# Patient Record
Sex: Male | Born: 1935 | Marital: Married | Smoking: Never smoker
Health system: Southern US, Community
[De-identification: ages and names within clinical notes are randomized; demographics above are authoritative.]

## PROBLEM LIST (undated history)

## (undated) DIAGNOSIS — I10 Essential (primary) hypertension: Secondary | ICD-10-CM

## (undated) DIAGNOSIS — N189 Chronic kidney disease, unspecified: Secondary | ICD-10-CM

## (undated) DIAGNOSIS — E119 Type 2 diabetes mellitus without complications: Secondary | ICD-10-CM

## (undated) HISTORY — PX: PACEMAKER IMPLANT: EP1218

---

## 2003-10-19 ENCOUNTER — Other Ambulatory Visit: Payer: Self-pay

## 2003-10-20 ENCOUNTER — Other Ambulatory Visit: Payer: Self-pay

## 2003-11-14 ENCOUNTER — Other Ambulatory Visit: Payer: Self-pay

## 2004-11-30 ENCOUNTER — Ambulatory Visit: Payer: Self-pay | Admitting: Unknown Physician Specialty

## 2010-04-06 ENCOUNTER — Emergency Department: Payer: Self-pay | Admitting: Emergency Medicine

## 2011-11-21 ENCOUNTER — Ambulatory Visit: Payer: Self-pay | Admitting: Gastroenterology

## 2012-06-18 ENCOUNTER — Ambulatory Visit: Payer: Self-pay | Admitting: Cardiology

## 2012-06-18 DIAGNOSIS — I10 Essential (primary) hypertension: Secondary | ICD-10-CM

## 2012-06-18 LAB — PROTIME-INR
INR: 1.1
Prothrombin Time: 13.9 secs (ref 11.5–14.7)

## 2012-06-18 LAB — BASIC METABOLIC PANEL
Anion Gap: 8 (ref 7–16)
BUN: 15 mg/dL (ref 7–18)
Calcium, Total: 9.3 mg/dL (ref 8.5–10.1)
Chloride: 101 mmol/L (ref 98–107)
Co2: 27 mmol/L (ref 21–32)
Creatinine: 1.25 mg/dL (ref 0.60–1.30)
EGFR (Non-African Amer.): 55 — ABNORMAL LOW
Glucose: 166 mg/dL — ABNORMAL HIGH (ref 65–99)
Sodium: 136 mmol/L (ref 136–145)

## 2012-06-18 LAB — CBC WITH DIFFERENTIAL/PLATELET
Basophil #: 0.1 10*3/uL (ref 0.0–0.1)
HGB: 14.7 g/dL (ref 13.0–18.0)
Lymphocyte #: 1.4 10*3/uL (ref 1.0–3.6)
Lymphocyte %: 18.6 %
MCH: 30.4 pg (ref 26.0–34.0)
MCHC: 34.5 g/dL (ref 32.0–36.0)
Monocyte %: 5.2 %
Neutrophil #: 5.5 10*3/uL (ref 1.4–6.5)
Platelet: 200 10*3/uL (ref 150–440)
RBC: 4.84 10*6/uL (ref 4.40–5.90)
RDW: 14.4 % (ref 11.5–14.5)

## 2012-06-18 LAB — APTT: Activated PTT: 29.8 secs (ref 23.6–35.9)

## 2012-06-25 ENCOUNTER — Ambulatory Visit: Payer: Self-pay | Admitting: Cardiology

## 2014-06-10 NOTE — Op Note (Signed)
PATIENT NAME:  Javier Wong, Javier Wong MR#:  [id number] DATE OF BIRTH:  12/25/1932  DATE OF PROCEDURE:  06/25/2012  PREPROCEDURE DIAGNOSES:   1.  Complete heart block. 2.  Elective replacement.  POSTPROCEURE DIAGNOSIS: Atrial sensing, with ventricular pacing.  PROCEDURE: Dual-chamber pacemaker generator change-out.  INDICATION: The patient is a 81 year old gentleman with a known history of complete heart block. Recent pacemaker interrogation has shown that the pacemaker is at elective replacement indication.   The risks, benefits and alternatives of pacemaker generator change-out were explained to the patient. Informed written consent was obtained.  He was brought to the operating room in the fasting state. The left pectoral region was prepped and draped in the usual sterile manner. Anesthesia was obtained with 1% Xylocaine locally. A  6 cm incision was performed in the left pectoral region. The old pacemaker generator was retrieved by electrocautery and blunt dissection. The leads were disconnected. The old pacemaker generator interrogated until proper thresholds were obtained.  The leads were connected to a new dual-chamber rate-responsive generator. A Medtronic Adapta ABR-I. The pacemaker pocket was irrigated with gentamycin solution. The new pacemaker generator was positioned in the pocket. The pocket was closed with 2-0 and 4-0 Vicryl respectively. Steri-Strips and a pressure dressing were applied.   ____________________________ Isaias Cowman, MD ap:dm D: 06/25/2012 13:08:10 ET T: 06/25/2012 13:21:04 ET JOB#: [id number]  cc: Isaias Cowman, MD, <Dictator> Isaias Cowman MD ELECTRONICALLY SIGNED 06/29/2012 13:48

## 2016-02-21 ENCOUNTER — Ambulatory Visit
Admission: RE | Admit: 2016-02-21 | Discharge: 2016-02-21 | Disposition: A | Discharge status: Home / Self Care | Payer: Medicare Other | Source: Ambulatory Visit | Attending: Internal Medicine | Admitting: Internal Medicine

## 2016-02-21 ENCOUNTER — Other Ambulatory Visit: Payer: Self-pay | Admitting: Internal Medicine

## 2016-02-21 DIAGNOSIS — R51 Headache: Secondary | ICD-10-CM | POA: Diagnosis present

## 2016-02-21 DIAGNOSIS — G319 Degenerative disease of nervous system, unspecified: Secondary | ICD-10-CM | POA: Diagnosis not present

## 2016-02-21 DIAGNOSIS — R519 Headache, unspecified: Secondary | ICD-10-CM

## 2018-09-10 ENCOUNTER — Emergency Department: Payer: Medicare Other

## 2018-09-10 ENCOUNTER — Inpatient Hospital Stay
Admission: EM | Admit: 2018-09-10 | Discharge: 2018-09-19 | DRG: 682 | Disposition: A | Discharge status: Home / Self Care | Payer: Medicare Other | Attending: Internal Medicine | Admitting: Internal Medicine

## 2018-09-10 ENCOUNTER — Other Ambulatory Visit: Payer: Self-pay

## 2018-09-10 DIAGNOSIS — E860 Dehydration: Secondary | ICD-10-CM | POA: Diagnosis present

## 2018-09-10 DIAGNOSIS — R918 Other nonspecific abnormal finding of lung field: Secondary | ICD-10-CM | POA: Diagnosis not present

## 2018-09-10 DIAGNOSIS — E875 Hyperkalemia: Secondary | ICD-10-CM | POA: Diagnosis present

## 2018-09-10 DIAGNOSIS — Z20828 Contact with and (suspected) exposure to other viral communicable diseases: Secondary | ICD-10-CM | POA: Diagnosis present

## 2018-09-10 DIAGNOSIS — J61 Pneumoconiosis due to asbestos and other mineral fibers: Secondary | ICD-10-CM | POA: Diagnosis present

## 2018-09-10 DIAGNOSIS — Z791 Long term (current) use of non-steroidal anti-inflammatories (NSAID): Secondary | ICD-10-CM

## 2018-09-10 DIAGNOSIS — R0602 Shortness of breath: Secondary | ICD-10-CM

## 2018-09-10 DIAGNOSIS — Z794 Long term (current) use of insulin: Secondary | ICD-10-CM

## 2018-09-10 DIAGNOSIS — Z7189 Other specified counseling: Secondary | ICD-10-CM | POA: Diagnosis not present

## 2018-09-10 DIAGNOSIS — M7989 Other specified soft tissue disorders: Secondary | ICD-10-CM | POA: Diagnosis present

## 2018-09-10 DIAGNOSIS — K769 Liver disease, unspecified: Secondary | ICD-10-CM

## 2018-09-10 DIAGNOSIS — E1122 Type 2 diabetes mellitus with diabetic chronic kidney disease: Secondary | ICD-10-CM | POA: Diagnosis present

## 2018-09-10 DIAGNOSIS — J449 Chronic obstructive pulmonary disease, unspecified: Secondary | ICD-10-CM | POA: Diagnosis present

## 2018-09-10 DIAGNOSIS — J9811 Atelectasis: Secondary | ICD-10-CM | POA: Diagnosis present

## 2018-09-10 DIAGNOSIS — I4892 Unspecified atrial flutter: Secondary | ICD-10-CM | POA: Diagnosis present

## 2018-09-10 DIAGNOSIS — I482 Chronic atrial fibrillation, unspecified: Secondary | ICD-10-CM | POA: Diagnosis present

## 2018-09-10 DIAGNOSIS — Z66 Do not resuscitate: Secondary | ICD-10-CM | POA: Diagnosis present

## 2018-09-10 DIAGNOSIS — E872 Acidosis: Secondary | ICD-10-CM | POA: Diagnosis present

## 2018-09-10 DIAGNOSIS — R5383 Other fatigue: Secondary | ICD-10-CM

## 2018-09-10 DIAGNOSIS — I130 Hypertensive heart and chronic kidney disease with heart failure and stage 1 through stage 4 chronic kidney disease, or unspecified chronic kidney disease: Secondary | ICD-10-CM | POA: Diagnosis present

## 2018-09-10 DIAGNOSIS — N189 Chronic kidney disease, unspecified: Secondary | ICD-10-CM | POA: Diagnosis not present

## 2018-09-10 DIAGNOSIS — F419 Anxiety disorder, unspecified: Secondary | ICD-10-CM | POA: Diagnosis present

## 2018-09-10 DIAGNOSIS — I5033 Acute on chronic diastolic (congestive) heart failure: Secondary | ICD-10-CM | POA: Diagnosis present

## 2018-09-10 DIAGNOSIS — K5900 Constipation, unspecified: Secondary | ICD-10-CM | POA: Diagnosis present

## 2018-09-10 DIAGNOSIS — T39395A Adverse effect of other nonsteroidal anti-inflammatory drugs [NSAID], initial encounter: Secondary | ICD-10-CM | POA: Diagnosis present

## 2018-09-10 DIAGNOSIS — C787 Secondary malignant neoplasm of liver and intrahepatic bile duct: Secondary | ICD-10-CM | POA: Diagnosis present

## 2018-09-10 DIAGNOSIS — E785 Hyperlipidemia, unspecified: Secondary | ICD-10-CM | POA: Diagnosis present

## 2018-09-10 DIAGNOSIS — I442 Atrioventricular block, complete: Secondary | ICD-10-CM | POA: Diagnosis present

## 2018-09-10 DIAGNOSIS — I3139 Other pericardial effusion (noninflammatory): Secondary | ICD-10-CM

## 2018-09-10 DIAGNOSIS — N179 Acute kidney failure, unspecified: Secondary | ICD-10-CM | POA: Diagnosis not present

## 2018-09-10 DIAGNOSIS — Z7982 Long term (current) use of aspirin: Secondary | ICD-10-CM

## 2018-09-10 DIAGNOSIS — Z950 Presence of cardiac pacemaker: Secondary | ICD-10-CM

## 2018-09-10 DIAGNOSIS — J9600 Acute respiratory failure, unspecified whether with hypoxia or hypercapnia: Secondary | ICD-10-CM | POA: Diagnosis present

## 2018-09-10 DIAGNOSIS — M109 Gout, unspecified: Secondary | ICD-10-CM | POA: Diagnosis present

## 2018-09-10 DIAGNOSIS — E871 Hypo-osmolality and hyponatremia: Secondary | ICD-10-CM | POA: Diagnosis present

## 2018-09-10 DIAGNOSIS — Z515 Encounter for palliative care: Secondary | ICD-10-CM | POA: Diagnosis not present

## 2018-09-10 DIAGNOSIS — R160 Hepatomegaly, not elsewhere classified: Secondary | ICD-10-CM | POA: Diagnosis present

## 2018-09-10 DIAGNOSIS — I959 Hypotension, unspecified: Secondary | ICD-10-CM | POA: Diagnosis present

## 2018-09-10 DIAGNOSIS — Z79899 Other long term (current) drug therapy: Secondary | ICD-10-CM

## 2018-09-10 DIAGNOSIS — Z789 Other specified health status: Secondary | ICD-10-CM

## 2018-09-10 DIAGNOSIS — N183 Chronic kidney disease, stage 3 (moderate): Secondary | ICD-10-CM | POA: Diagnosis present

## 2018-09-10 DIAGNOSIS — D631 Anemia in chronic kidney disease: Secondary | ICD-10-CM | POA: Diagnosis present

## 2018-09-10 DIAGNOSIS — Z79891 Long term (current) use of opiate analgesic: Secondary | ICD-10-CM

## 2018-09-10 DIAGNOSIS — I313 Pericardial effusion (noninflammatory): Secondary | ICD-10-CM | POA: Diagnosis present

## 2018-09-10 HISTORY — DX: Chronic kidney disease, unspecified: N18.9

## 2018-09-10 HISTORY — DX: Essential (primary) hypertension: I10

## 2018-09-10 HISTORY — DX: Type 2 diabetes mellitus without complications: E11.9

## 2018-09-10 LAB — COMPREHENSIVE METABOLIC PANEL
ALT: 14 U/L (ref 0–44)
AST: 19 U/L (ref 15–41)
Albumin: 3.7 g/dL (ref 3.5–5.0)
Alkaline Phosphatase: 71 U/L (ref 38–126)
Anion gap: 17 — ABNORMAL HIGH (ref 5–15)
BUN: 70 mg/dL — ABNORMAL HIGH (ref 8–23)
CO2: 16 mmol/L — ABNORMAL LOW (ref 22–32)
Calcium: 8.8 mg/dL — ABNORMAL LOW (ref 8.9–10.3)
Chloride: 97 mmol/L — ABNORMAL LOW (ref 98–111)
Creatinine, Ser: 2.84 mg/dL — ABNORMAL HIGH (ref 0.61–1.24)
GFR calc Af Amer: 23 mL/min — ABNORMAL LOW (ref >60)
GFR calc non Af Amer: 20 mL/min — ABNORMAL LOW (ref >60)
Glucose, Bld: 122 mg/dL — ABNORMAL HIGH (ref 70–99)
Potassium: 6 mmol/L — ABNORMAL HIGH (ref 3.5–5.1)
Sodium: 130 mmol/L — ABNORMAL LOW (ref 135–145)
Total Bilirubin: 1 mg/dL (ref 0.3–1.2)
Total Protein: 7.7 g/dL (ref 6.5–8.1)

## 2018-09-10 LAB — CBC WITH DIFFERENTIAL/PLATELET
Abs Immature Granulocytes: 0.1 10*3/uL — ABNORMAL HIGH (ref 0.00–0.07)
Basophils Absolute: 0 10*3/uL (ref 0.0–0.1)
Basophils Relative: 0 %
Eosinophils Absolute: 0 10*3/uL (ref 0.0–0.5)
Eosinophils Relative: 0 %
HCT: 41.4 % (ref 39.0–52.0)
Hemoglobin: 13.5 g/dL (ref 13.0–17.0)
Immature Granulocytes: 1 %
Lymphocytes Relative: 4 %
Lymphs Abs: 0.5 10*3/uL — ABNORMAL LOW (ref 0.7–4.0)
MCH: 29.9 pg (ref 26.0–34.0)
MCHC: 32.6 g/dL (ref 30.0–36.0)
MCV: 91.8 fL (ref 80.0–100.0)
Monocytes Absolute: 0.7 10*3/uL (ref 0.1–1.0)
Monocytes Relative: 5 %
Neutro Abs: 12.9 10*3/uL — ABNORMAL HIGH (ref 1.7–7.7)
Neutrophils Relative %: 90 %
Platelets: 420 10*3/uL — ABNORMAL HIGH (ref 150–400)
RBC: 4.51 MIL/uL (ref 4.22–5.81)
RDW: 14.2 % (ref 11.5–15.5)
WBC: 14.3 10*3/uL — ABNORMAL HIGH (ref 4.0–10.5)
nRBC: 0 % (ref 0.0–0.2)

## 2018-09-10 LAB — GLUCOSE, CAPILLARY: Glucose-Capillary: 111 mg/dL — ABNORMAL HIGH (ref 70–99)

## 2018-09-10 LAB — TSH: TSH: 1.404 u[IU]/mL (ref 0.350–4.500)

## 2018-09-10 LAB — SARS CORONAVIRUS 2 BY RT PCR (HOSPITAL ORDER, PERFORMED IN ~~LOC~~ HOSPITAL LAB): SARS Coronavirus 2: NEGATIVE

## 2018-09-10 LAB — MAGNESIUM: Magnesium: 2.6 mg/dL — ABNORMAL HIGH (ref 1.7–2.4)

## 2018-09-10 LAB — BRAIN NATRIURETIC PEPTIDE: B Natriuretic Peptide: 72 pg/mL (ref 0.0–100.0)

## 2018-09-10 LAB — PROTIME-INR
INR: 1.3 — ABNORMAL HIGH (ref 0.8–1.2)
Prothrombin Time: 16.1 seconds — ABNORMAL HIGH (ref 11.4–15.2)

## 2018-09-10 LAB — APTT: aPTT: 33 seconds (ref 24–36)

## 2018-09-10 LAB — TROPONIN I (HIGH SENSITIVITY)
Troponin I (High Sensitivity): 14 ng/L (ref <18)
Troponin I (High Sensitivity): 15 ng/L (ref <18)

## 2018-09-10 LAB — POTASSIUM: Potassium: 5.6 mmol/L — ABNORMAL HIGH (ref 3.5–5.1)

## 2018-09-10 MED ORDER — HYDROCODONE-ACETAMINOPHEN 5-325 MG PO TABS: 1.0000 | ORAL_TABLET | ORAL | Status: DC | PRN

## 2018-09-10 MED ORDER — SODIUM CHLORIDE 0.9 % IV BOLUS
500.0000 mL | Freq: Once | INTRAVENOUS | Status: AC
  Administered 2018-09-10: 500 mL via INTRAVENOUS

## 2018-09-10 MED ORDER — BISACODYL 5 MG PO TBEC: 5.0000 mg | DELAYED_RELEASE_TABLET | Freq: Every day | ORAL | Status: DC | PRN

## 2018-09-10 MED ORDER — INSULIN ASPART 100 UNIT/ML ~~LOC~~ SOLN
0.0000 [IU] | Freq: Three times a day (TID) | SUBCUTANEOUS | Status: DC
  Administered 2018-09-11 – 2018-09-12 (×3): 1 [IU] via SUBCUTANEOUS
  Administered 2018-09-13: 2 [IU] via SUBCUTANEOUS
  Administered 2018-09-13 – 2018-09-14 (×4): 1 [IU] via SUBCUTANEOUS
  Administered 2018-09-15: 3 [IU] via SUBCUTANEOUS
  Administered 2018-09-16: 2 [IU] via SUBCUTANEOUS
  Administered 2018-09-16: 1 [IU] via SUBCUTANEOUS
  Administered 2018-09-17: 2 [IU] via SUBCUTANEOUS
  Administered 2018-09-17: 3 [IU] via SUBCUTANEOUS
  Administered 2018-09-18 (×2): 2 [IU] via SUBCUTANEOUS
  Administered 2018-09-19: 3 [IU] via SUBCUTANEOUS
  Filled 2018-09-10 (×16): qty 1

## 2018-09-10 MED ORDER — ACETAMINOPHEN 650 MG RE SUPP: 650.0000 mg | Freq: Four times a day (QID) | RECTAL | Status: DC | PRN

## 2018-09-10 MED ORDER — SODIUM ZIRCONIUM CYCLOSILICATE 10 G PO PACK
10.0000 g | PACK | Freq: Three times a day (TID) | ORAL | Status: DC
  Administered 2018-09-10 – 2018-09-12 (×6): 10 g via ORAL
  Filled 2018-09-10 (×9): qty 1

## 2018-09-10 MED ORDER — HEPARIN SODIUM (PORCINE) 5000 UNIT/ML IJ SOLN
5000.0000 [IU] | Freq: Three times a day (TID) | INTRAMUSCULAR | Status: DC
  Administered 2018-09-10 – 2018-09-16 (×18): 5000 [IU] via SUBCUTANEOUS
  Filled 2018-09-10 (×18): qty 1

## 2018-09-10 MED ORDER — TRAZODONE HCL 50 MG PO TABS
25.0000 mg | ORAL_TABLET | Freq: Every evening | ORAL | Status: DC | PRN
  Administered 2018-09-11 – 2018-09-14 (×4): 25 mg via ORAL
  Filled 2018-09-10 (×4): qty 1

## 2018-09-10 MED ORDER — ACETAMINOPHEN 325 MG PO TABS: 650.0000 mg | ORAL_TABLET | Freq: Four times a day (QID) | ORAL | Status: DC | PRN

## 2018-09-10 MED ORDER — DOCUSATE SODIUM 100 MG PO CAPS
100.0000 mg | ORAL_CAPSULE | Freq: Two times a day (BID) | ORAL | Status: DC
  Administered 2018-09-15 – 2018-09-16 (×3): 100 mg via ORAL
  Filled 2018-09-10 (×13): qty 1

## 2018-09-10 MED ORDER — STERILE WATER FOR INJECTION IV SOLN
INTRAVENOUS | Status: DC
  Administered 2018-09-10 – 2018-09-12 (×4): via INTRAVENOUS
  Filled 2018-09-10 (×5): qty 9.71

## 2018-09-10 MED ORDER — ONDANSETRON HCL 4 MG PO TABS: 4.0000 mg | ORAL_TABLET | Freq: Four times a day (QID) | ORAL | Status: DC | PRN

## 2018-09-10 MED ORDER — ONDANSETRON HCL 4 MG/2ML IJ SOLN: 4.0000 mg | Freq: Four times a day (QID) | INTRAMUSCULAR | Status: DC | PRN

## 2018-09-10 NOTE — ED Notes (Addendum)
ED TO INPATIENT HANDOFF REPORT  ED Nurse Name and Phone #: Denine Brotz [phone]  S Name/Age/Gender Javier Wong 85 y.o. male Room/Bed: ED24A/ED24A  Code Status   Code Status: Full Code  Home/SNF/Other home Patient oriented x 4 Is this baseline?   Triage Complete: Triage complete  Chief Complaint does not feel well  Triage Note Pt arrives to ed via ems from home. Ems reports pt son called ems due to pt stating he felt like pace maker wasn't working right. EMS reports pt has not been eating well for several weeks. Pt a&o x 4 on arrival. Pt reported to ems that when attempting to feel radial pulse he was unable to feel pulse and felt like his pacer wasn't working correctly. Pt unable to recall what kind of pacer he has. NAD noted at this time   Ems vitasl: 138/84 CBG: 118   Allergies No Known Allergies  Level of Care/Admitting Diagnosis ED Disposition    ED Disposition Condition [facility] Area: [place] [[id number]]  Level of Care: Telemetry [5]  Covid Evaluation: Asymptomatic Screening Protocol (No Symptoms)  Diagnosis: Acute kidney injury [facility]) [[id number]]  Admitting Physician: Epifanio Lesches [[id number]]  Attending Physician: Epifanio Lesches [phone]  Estimated length of stay: past midnight tomorrow  Certification:: I certify this patient will need inpatient services for at least 2 midnights  PT Class (Do Not Modify): Inpatient [101]  PT Acc Code (Do Not Modify): Private [1]       B Medical/Surgery History   A IV Location/Drains/Wounds Patient Lines/Drains/Airways Status   Active Line/Drains/Airways    Name:   Placement date:   Placement time:   Site:   Days:   Peripheral IV 09/10/18 Left Antecubital   09/10/18    1340    Antecubital   less than 1          Intake/Output Last 24 hours  Intake/Output Summary (Last 24 hours) at 09/10/2018 1812 Last data filed at 09/10/2018 1703 Gross per 24 hour  Intake 500 ml   Output -  Net 500 ml    Labs/Imaging Results for orders placed or performed during the hospital encounter of 09/10/18 (from the past 48 hour(s))  CBC with Differential     Status: Abnormal   Collection Time: 09/10/18  2:02 PM  Result Value Ref Range   WBC 14.3 (H) 4.0 - 10.5 K/uL   RBC 4.51 4.22 - 5.81 MIL/uL   Hemoglobin 13.5 13.0 - 17.0 g/dL   HCT 41.4 39.0 - 52.0 %   MCV 91.8 80.0 - 100.0 fL   MCH 29.9 26.0 - 34.0 pg   MCHC 32.6 30.0 - 36.0 g/dL   RDW 14.2 11.5 - 15.5 %   Platelets 420 (H) 150 - 400 K/uL   nRBC 0.0 0.0 - 0.2 %   Neutrophils Relative % 90 %   Neutro Abs 12.9 (H) 1.7 - 7.7 K/uL   Lymphocytes Relative 4 %   Lymphs Abs 0.5 (L) 0.7 - 4.0 K/uL   Monocytes Relative 5 %   Monocytes Absolute 0.7 0.1 - 1.0 K/uL   Eosinophils Relative 0 %   Eosinophils Absolute 0.0 0.0 - 0.5 K/uL   Basophils Relative 0 %   Basophils Absolute 0.0 0.0 - 0.1 K/uL   Immature Granulocytes 1 %   Abs Immature Granulocytes 0.10 (H) 0.00 - 0.07 K/uL    Comment: Performed at [facility], [address] [place], [place] [address]  Comprehensive metabolic  panel     Status: Abnormal   Collection Time: 09/10/18  2:02 PM  Result Value Ref Range   Sodium 130 (L) 135 - 145 mmol/L   Potassium 6.0 (H) 3.5 - 5.1 mmol/L   Chloride 97 (L) 98 - 111 mmol/L   CO2 16 (L) 22 - 32 mmol/L   Glucose, Bld 122 (H) 70 - 99 mg/dL   BUN 70 (H) 8 - 23 mg/dL   Creatinine, Ser 2.84 (H) 0.61 - 1.24 mg/dL   Calcium 8.8 (L) 8.9 - 10.3 mg/dL   Total Protein 7.7 6.5 - 8.1 g/dL   Albumin 3.7 3.5 - 5.0 g/dL   AST 19 15 - 41 U/L   ALT 14 0 - 44 U/L   Alkaline Phosphatase 71 38 - 126 U/L   Total Bilirubin 1.0 0.3 - 1.2 mg/dL   GFR calc non Af Amer 20 (L) >60 mL/min   GFR calc Af Amer 23 (L) >60 mL/min   Anion gap 17 (H) 5 - 15    Comment: Performed at [facility], [place], [place] [address]  Troponin I (High Sensitivity)     Status: None   Collection Time: 09/10/18  2:02 PM   Result Value Ref Range   Troponin I (High Sensitivity) 14 <18 ng/L    Comment: (NOTE) Elevated high sensitivity troponin I (hsTnI) values and significant  changes across serial measurements may suggest ACS but many other  chronic and acute conditions are known to elevate hsTnI results.  Refer to the "Links" section for chest pain algorithms and additional  guidance. Performed at [facility], [address] [place], [place] [address]   Magnesium     Status: Abnormal   Collection Time: 09/10/18  2:02 PM  Result Value Ref Range   Magnesium 2.6 (H) 1.7 - 2.4 mg/dL    Comment: Performed at [facility], [address] [place], [place] [address]  Brain natriuretic peptide     Status: None   Collection Time: 09/10/18  2:02 PM  Result Value Ref Range   B Natriuretic Peptide 72.0 0.0 - 100.0 pg/mL    Comment: Performed at [facility], [address] [place], [place] [address]  Protime-INR     Status: Abnormal   Collection Time: 09/10/18  2:02 PM  Result Value Ref Range   Prothrombin Time 16.1 (H) 11.4 - 15.2 seconds   INR 1.3 (H) 0.8 - 1.2    Comment: (NOTE) INR goal varies based on device and disease states. Performed at [facility], [address] [place], [place] [address]   APTT     Status: None   Collection Time: 09/10/18  2:02 PM  Result Value Ref Range   aPTT 33 24 - 36 seconds    Comment: Performed at [facility], [address] [place], [place] [address]  SARS Coronavirus 2 (CEPHEID - Performed in [place] hospital lab), Hosp Order     Status: None   Collection Time: 09/10/18  2:03 PM   Specimen: Nasopharyngeal Swab  Result Value Ref Range   SARS Coronavirus 2 NEGATIVE NEGATIVE    Comment: (NOTE) If result is NEGATIVE SARS-CoV-2 target nucleic acids are NOT DETECTED. The SARS-CoV-2 RNA is generally detectable in upper and lower  respiratory specimens during the acute phase of infection. The lowest   concentration of SARS-CoV-2 viral copies this assay can detect is 250  copies / mL. A negative result does not preclude SARS-CoV-2  infection  and should not be used as the sole basis for treatment or other  patient management decisions.  A negative result may occur with  improper specimen collection / handling, submission of specimen other  than nasopharyngeal swab, presence of viral mutation(s) within the  areas targeted by this assay, and inadequate number of viral copies  (<250 copies / mL). A negative result must be combined with clinical  observations, patient history, and epidemiological information. If result is POSITIVE SARS-CoV-2 target nucleic acids are DETECTED. The SARS-CoV-2 RNA is generally detectable in upper and lower  respiratory specimens dur ing the acute phase of infection.  Positive  results are indicative of active infection with SARS-CoV-2.  Clinical  correlation with patient history and other diagnostic information is  necessary to determine patient infection status.  Positive results do  not rule out bacterial infection or co-infection with other viruses. If result is PRESUMPTIVE POSTIVE SARS-CoV-2 nucleic acids MAY BE PRESENT.   A presumptive positive result was obtained on the submitted specimen  and confirmed on repeat testing.  While 2019 novel coronavirus  (SARS-CoV-2) nucleic acids may be present in the submitted sample  additional confirmatory testing may be necessary for epidemiological  and / or clinical management purposes  to differentiate between  SARS-CoV-2 and other Sarbecovirus currently known to infect humans.  If clinically indicated additional testing with an alternate test  methodology [phone]) is advised. The SARS-CoV-2 RNA is generally  detectable in upper and lower respiratory sp ecimens during the acute  phase of infection. The expected result is Negative. Fact Sheet for Patients:  StrictlyIdeas.no Fact Sheet  for Healthcare Providers: BankingDealers.co.za This test is not yet approved or cleared by the [place] FDA and has been authorized for detection and/or diagnosis of SARS-CoV-2 by FDA under an Emergency Use Authorization (EUA).  This EUA will remain in effect (meaning this test can be used) for the duration of the COVID-19 declaration under Section 564(b)(1) of the Act, 21 U.S.C. section 360bbb-3(b)(1), unless the authorization is terminated or revoked sooner. Performed at [facility], [address], [place], [place] [address]    Dg Chest Portable 1 View  Result Date: 09/10/2018 CLINICAL DATA:  Shortness of breath. EXAM: PORTABLE CHEST 1 VIEW COMPARISON:  Radiograph of April 06, 2010. FINDINGS: Stable cardiomegaly. Left-sided pacemaker is unchanged in position. No pneumothorax is noted. No significant pleural effusion is noted. Minimal bibasilar subsegmental atelectasis is noted. Degenerative changes are seen involving both glenohumeral joints. IMPRESSION: Minimal bibasilar subsegmental atelectasis. Electronically Signed   By: Marijo Conception M.D.   On: 09/10/2018 14:47    Pending Labs Unresulted Labs (From admission, onward)    Start     Ordered   09/11/18 8119  Basic metabolic panel  Tomorrow morning,   STAT     09/10/18 1521   09/11/18 0500  CBC  Tomorrow morning,   STAT     09/10/18 1521   09/10/18 1636  Hemoglobin A1c  Once,   STAT    Comments: To assess prior glycemic control    09/10/18 1635   09/10/18 1518  CBC  (heparin)  Once,   STAT    Comments: Baseline for heparin therapy IF NOT ALREADY DRAWN.  Notify MD if PLT < 100 K.    09/10/18 1521   09/10/18 1518  Creatinine, serum  (heparin)  Once,   STAT    Comments: Baseline for heparin therapy IF NOT ALREADY DRAWN.    09/10/18 1521  09/10/18 1442  TSH  ONCE - STAT,   STAT     09/10/18 1441          Vitals/Pain Today's Vitals   09/10/18 1630 09/10/18 1700 09/10/18 1730  09/10/18 1800  BP: 124/65 (!) 143/59 (!) 132/56 (!) 119/47  Pulse: (!) 49 (!) 59  (!) 47  Resp: 18 20  (!) 23  Temp:      TempSrc:      SpO2: 94% 96%  (!) 77%  Weight:      Height:      PainSc:        Isolation Precautions No active isolations  Medications Medications  heparin injection 5,000 Units (has no administration in time range)  acetaminophen (TYLENOL) tablet 650 mg (has no administration in time range)    Or  acetaminophen (TYLENOL) suppository 650 mg (has no administration in time range)  HYDROcodone-acetaminophen (NORCO/VICODIN) 5-325 MG per tablet 1 tablet (has no administration in time range)  traZODone (DESYREL) tablet 25 mg (has no administration in time range)  docusate sodium (COLACE) capsule 100 mg (has no administration in time range)  bisacodyl (DULCOLAX) EC tablet 5 mg (has no administration in time range)  ondansetron (ZOFRAN) tablet 4 mg (has no administration in time range)    Or  ondansetron (ZOFRAN) injection 4 mg (has no administration in time range)  sodium chloride 0.225 % with sodium bicarbonate 100 mEq infusion ( Intravenous New Bag/Given 09/10/18 1656)  sodium zirconium cyclosilicate (LOKELMA) packet 10 g (10 g Oral Given 09/10/18 1659)  insulin aspart (novoLOG) injection 0-9 Units (has no administration in time range)  sodium chloride 0.9 % bolus 500 mL (0 mLs Intravenous Stopped 09/10/18 1703)    Mobility Normally ambulatory with cane/walker     Focused Assessments    R Recommendations: See Admitting Provider Note  Report given to:   Additional Notes:

## 2018-09-10 NOTE — ED Notes (Signed)
Attempted to call report at this time, unable to give report. RN states she will look over pt and call back

## 2018-09-10 NOTE — ED Notes (Signed)
2A notified the RN was bringing pt to floor at this time and bedside report will be completed on arrival. Report given to beatrice upon arrival to room 238

## 2018-09-10 NOTE — H&P (Addendum)
[place] [facility] [place] NAME: Javier Wong    MR#:  [id number]  DATE OF BIRTH:  July 24, 1932  DATE OF ADMISSION:  09/10/2018  PRIMARY CARE PHYSICIAN: Javier Axe, MD   REQUESTING/REFERRING PHYSICIAN: Dr. Marjean Wong  CHIEF COMPLAINT: Generalized weakness, poor p.o. intake   Chief Complaint  Patient presents with  . Fatigue  . Pacemaker Problem    HISTORY OF PRESENT ILLNESS:  Javier Wong  is a 85 y.o. male with a known history of hypertension, diabetes mellitus type 2 comes in because of fatigue.  Patient told me that he is feeling Very weak, nauseous and has poor p.o. intake for 4 weeks.  He says that whenever he tries to eat he feels very nauseous so he stopped eating.  Denies abdominal pain or diarrhea.  No fever.  Patient also complains of shortness of breath with minimal exertion.  No 1 2 L of oxygen.  And saturation 92%.  COVID-19 test is pending.  No cough.  Patient lives alone, main complaint today is generalized weakness, poor p.o. intake.  He thought it is a pacemaker.  Patient found to have acute kidney injury with potassium up to 6, will consult nephrology, cardiology.  Patient found to have heart rate around 50 bpm.  PAST MEDICAL HISTORY:  No past medical history on file.   Patient tells me he has history of hypertension, diabetes mellitus type 2.  PAST SURGICAL HISTOIRY:  No surgical history.  SOCIAL HISTORY:   Social History   Tobacco Use  . Smoking status: Never Smoker  Substance Use Topics  . Alcohol use: Never    Frequency: Never    FAMILY HISTORY:  No family history on file.  DRUG ALLERGIES:  No Known Allergies  REVIEW OF SYSTEMS:  CONSTITUTIONAL: Generalized weakness, shortness of breath. EYES: No blurred or double vision.  EARS, NOSE, AND THROAT: No tinnitus or ear pain.  RESPIRATORY: No cough, shortness of breath, wheezing or hemoptysis.  CARDIOVASCULAR: No chest pain, orthopnea, edema.   GASTROINTESTINAL: Nausea for 4 weeks and poor p.o. intake because of nausea.  GENITOURINARY: No dysuria, hematuria.  ENDOCRINE: No polyuria, nocturia,  HEMATOLOGY: No anemia, easy bruising or bleeding SKIN: No rash or lesion. MUSCULOSKELETAL: No joint pain or arthritis.   NEUROLOGIC: No tingling, numbness, weakness.  PSYCHIATRY: No anxiety or depression.   MEDICATIONS AT HOME:   Prior to Admission medications   Medication Sig Start Date End Date Taking? Authorizing Provider  allopurinol (ZYLOPRIM) 100 MG tablet Take 100 mg by mouth daily. 07/10/18  Yes [provider]  aspirin EC 81 MG tablet Take 81 mg by mouth daily.   Yes [provider]  cyanocobalamin (,VITAMIN B-12,) 1000 MCG/ML injection Inject 1,000 mcg into the muscle every 30 (thirty) days. 03/24/18  Yes [provider]  furosemide (LASIX) 40 MG tablet Take 40 mg by mouth daily. 02/20/18  Yes [provider]  hydrochlorothiazide (HYDRODIURIL) 25 MG tablet Take 25 mg by mouth daily. 02/20/18  Yes [provider]  insulin lispro protamine-lispro (HUMALOG 75/25 MIX) (75-25) 100 UNIT/ML SUSP injection Inject 10 Units into the skin daily with breakfast. 02/20/18  Yes [provider]  metFORMIN (GLUCOPHAGE) 1000 MG tablet Take 1,000 mg by mouth 2 (two) times a day. 03/31/18  Yes [provider]  naproxen sodium (ALEVE) 220 MG tablet Take 220 mg by mouth daily as needed.   Yes [provider]  ramipril (ALTACE) 10 MG capsule Take 10  mg by mouth 2 (two) times a day. 07/10/18  Yes [provider]  simvastatin (ZOCOR) 40 MG tablet Take 40 mg by mouth at bedtime. 02/20/18  Yes [provider]  traMADol (ULTRAM) 50 MG tablet Take 50 mg by mouth every 8 (eight) hours as needed for pain. 04/14/18  Yes [provider]      VITAL SIGNS:  Blood pressure (!) 140/58, pulse (!) 50, temperature 97.6 F (36.4 C), temperature source Oral, resp. rate 14, height 5'  11" (1.803 m), weight 108.9 kg, SpO2 95 %.  PHYSICAL EXAMINATION:  GENERAL:  85 y.o.-year-old patient lying in the bed with no acute distress.  Patient appears unkempt. EYES: Pupils equal, round, reactive to light . No scleral icterus. Extraocular muscles intact.  HEENT: Head atraumatic, normocephalic. Oropharynx and nasopharynx clear.  NECK:  Supple, no jugular venous distention. No thyroid enlargement, no tenderness.  LUNGS: Diminished air entry bilaterally.  CARDIOVASCULAR: S1, S2 normal. No murmurs, rubs, or gallops.  ABDOMEN: Soft, nontender, nondistended. Bowel sounds present. No organomegaly or mass.  EXTREMITIES: No pedal edema, cyanosis, or clubbing.  NEUROLOGIC: Cranial nerves II through XII are intact. Muscle strength 5/5 in all extremities. Sensation intact. Gait not checked.  PSYCHIATRIC: The patient is alert and oriented x 3.  SKIN: No obvious rash, lesion, or ulcer.   LABORATORY PANEL:   CBC Recent Labs  Lab 09/10/18 1402  WBC 14.3*  HGB 13.5  HCT 41.4  PLT 420*   ------------------------------------------------------------------------------------------------------------------  Chemistries  Recent Labs  Lab 09/10/18 1402  NA 130*  K 6.0*  CL 97*  CO2 16*  GLUCOSE 122*  BUN 70*  CREATININE 2.84*  CALCIUM 8.8*  MG 2.6*  AST 19  ALT 14  ALKPHOS 71  BILITOT 1.0   ------------------------------------------------------------------------------------------------------------------  Cardiac Enzymes No results for input(s): TROPONINI in the last 168 hours. ------------------------------------------------------------------------------------------------------------------  RADIOLOGY:  Dg Chest Portable 1 View  Result Date: 09/10/2018 CLINICAL DATA:  Shortness of breath. EXAM: PORTABLE CHEST 1 VIEW COMPARISON:  Radiograph of April 06, 2010. FINDINGS: Stable cardiomegaly. Left-sided pacemaker is unchanged in position. No pneumothorax is noted. No significant  pleural effusion is noted. Minimal bibasilar subsegmental atelectasis is noted. Degenerative changes are seen involving both glenohumeral joints. IMPRESSION: Minimal bibasilar subsegmental atelectasis. Electronically Signed   By: Javier Wong M.D.   On: 09/10/2018 14:47    EKG:   Orders placed or performed during the hospital encounter of 09/10/18  . EKG 12-Lead  . EKG 12-Lead  EKG showed ventricular paced rhythm at 50 bpm, no ST elevations or T wave inversions.  IMPRESSION AND PLAN:   85 year old male patient with history of essential hypertension, diabetes mellitus type 2, pacemaker comes in because of decreased fluid intake and shortness of breath, nausea for 4 weeks.   Acute kidney injury, unknown baseline patient's creatinine was 1.25 in 2014 and I do not have any r labs since then in the computer.  Prerenal likely secondary to poor p.o. intake, continue IV hydration, patient has nausea likely secondary to uremia and acidosis, started on bicarb drip, consult nephrology. 2.  Severe hyperkalemia with potassium of 6 patient did not get any shifting measures in the emergency room, started on bicarb drip, spoke with Dr. Zollie Scale who recommended to give [facility] now, also ordered 10 mg 3 times daily. 3.  Chronic A. fib.  Patient says heart rate is around 70s, currently ER doctor felt  EKG concerning for underlying atrial flutter, ER doctor discussed with Dr. Jacqualine Code  reported patient has dual-chamber pacemaker due to atrial arrhythmia switched to VVI with low heart rate 50 bpm.  Consult cardiology.  #4/acute respiratory failure likely secondary to renal failure, continue oxygen and wean off as tolerated, check COVID-19 test is ordered, done results not available yet.  5.Lower extremity edema, patient clinically appears dehydrated so continue IV hydration at this time, follow echocardiogram\ #6 deconditioning: Continue physical therapy and getting progressively weaker for the last 4 weeks 7.   Diabetes mellitus type 2: Continue sliding scale insulin with coverage, p hold metformin, oral diabetic secondary t to poor p.o. intake. 8.  History of heart failure unknown systolic or diastolic, check echocardiogram, hold diuretics secondary to renal failure, nephrotoxic agents.   All the records are reviewed and case discussed with ED provider. Management plans discussed with the patient, family and they are in agreement.  CODE STATUS: full TOTAL TIME TAKING CARE OF THIS PATIENT: 55 minutes.    Epifanio Lesches M.D on 09/10/2018 at 4:17 PM  Between 7am to 6pm - Pager - [phone]  After 6pm go to [email or web address] - password [email or web address] [email or web address]  Office  [phone]  CC: Primary care physician; Javier Axe, MD  Note: This dictation was prepared with Dragon dictation along with smaller phrase technology. Any transcriptional errors that result from this process are unintentional.

## 2018-09-10 NOTE — ED Triage Notes (Signed)
Pt arrives to ed via ems from home. Ems reports pt son called ems due to pt stating he felt like pace maker wasn't working right. EMS reports pt has not been eating well for several weeks. Pt a&o x 4 on arrival. Pt reported to ems that when attempting to feel radial pulse he was unable to feel pulse and felt like his pacer wasn't working correctly. Pt unable to recall what kind of pacer he has. NAD noted at this time   Ems vitasl: 138/84 CBG: 118

## 2018-09-10 NOTE — ED Provider Notes (Signed)
[facility] Emergency Department Provider Note  ____________________________________________   First MD Initiated Contact with Patient 09/10/18 1350     (approximate)  I have reviewed the triage vital signs and the nursing notes.   HISTORY  Chief Complaint Fatigue and Pacemaker Problem    HPI Javier Wong is a 85 y.o. male who presents with EMS from home.He says that he has not been eating well for the past several weeks.  Patient said he is had about 1 month of not eating, shortness of breath.  Shortness of breath is worse with exertion, better with rest.  It is intermittent in nature.  Moderate.  He is denies any chest pain.  He then felt like his pacemaker was not working right.  He is unsure when he first thought this.  He seen at the [facility] clinic cardiology.  He is unclear what kind of pacemaker he has.  He denies urinary symptoms. He does endorse leg swelling.          Medical history.  Diabetes, pacemaker Surgical.  Pacemaker placement  Allergies Patient has no allergy information on record.  No family history on file.  Social History Social History   Tobacco Use  . Smoking status: Never Smoker  Substance Use Topics  . Alcohol use: Never    Frequency: Never  . Drug use: Never      Review of Systems Constitutional: No fever/chills Eyes: No visual changes. ENT: No sore throat. Cardiovascular: Denies chest pain. Respiratory: Positive for shortness of breath. Gastrointestinal: No abdominal pain.  No nausea, no vomiting.  No diarrhea.  No constipation.  Positive for decreased p.o. intake Genitourinary: Negative for dysuria. Musculoskeletal: Negative for back pain. Skin: Negative for rash. Neurological: Negative for headaches, focal weakness or numbness. All other ROS negative ____________________________________________   PHYSICAL EXAM:  VITAL SIGNS: Blood pressure 137/61, pulse (!) 50, temperature 97.6 F (36.4 C),  temperature source Oral, resp. rate 20, height 5\' 11"  (1.803 m), weight 108.9 kg, SpO2 94 %.  Constitutional: Alert and oriented. Well appearing and in no acute distress. Eyes: Conjunctivae are normal. EOMI. Head: Atraumatic. Nose: No congestion/rhinnorhea. Mouth/Throat: Mucous membranes are moist.   Neck: No stridor. Trachea Midline. FROM Cardiovascular: Normal rate, regular rhythm. Grossly normal heart sounds.  Good peripheral circulation. Respiratory: Normal respiratory effort.  No retractions. Lungs CTAB. Gastrointestinal: Soft and nontender. No distention. No abdominal bruits.  Musculoskeletal: 1+ edema bilaterally.  No joint effusions. Neurologic:  Normal speech and language. No gross focal neurologic deficits are appreciated.  Skin:  Skin is warm, dry and intact. No rash noted. Psychiatric: Mood and affect are normal. Speech and behavior are normal. GU: Deferred   ____________________________________________   LABS (all labs ordered are listed, but only abnormal results are displayed)  Labs Reviewed  CBC WITH DIFFERENTIAL/PLATELET - Abnormal; Notable for the following components:      Result Value   WBC 14.3 (*)    Platelets 420 (*)    Neutro Abs 12.9 (*)    Lymphs Abs 0.5 (*)    Abs Immature Granulocytes 0.10 (*)    All other components within normal limits  SARS CORONAVIRUS 2 (HOSPITAL ORDER, [facility])  COMPREHENSIVE METABOLIC PANEL  MAGNESIUM  BRAIN NATRIURETIC PEPTIDE  PROTIME-INR  APTT  TROPONIN I (HIGH SENSITIVITY)   ____________________________________________   ED ECG REPORT I, Vanessa [place], the attending physician, personally viewed and interpreted this ECG.  EKG ventricularly paced at a rate of  50, no ST elevation, no T wave inversion.  Potentially has an underlying flutter. ____________________________________________  RADIOLOGY Stirling Bellow, personally viewed and evaluated these images (plain radiographs) as part of  my medical decision making, as well as reviewing the written report by the radiologist.  ED MD interpretation: Chest x-ray without evidence of pleural effusions  Official radiology report(s): Dg Chest Portable 1 View  Result Date: 09/10/2018 CLINICAL DATA:  Shortness of breath. EXAM: PORTABLE CHEST 1 VIEW COMPARISON:  Radiograph of April 06, 2010. FINDINGS: Stable cardiomegaly. Left-sided pacemaker is unchanged in position. No pneumothorax is noted. No significant pleural effusion is noted. Minimal bibasilar subsegmental atelectasis is noted. Degenerative changes are seen involving both glenohumeral joints. IMPRESSION: Minimal bibasilar subsegmental atelectasis. Electronically Signed   By: Marijo Conception M.D.   On: 09/10/2018 14:47    ____________________________________________   PROCEDURES  Procedure(s) performed (including Critical Care):  Procedures   ____________________________________________   INITIAL IMPRESSION / ASSESSMENT AND PLAN / ED COURSE  Javier Wong was evaluated in Emergency Department on 09/10/2018 for the symptoms described in the history of present illness. He was evaluated in the context of the global COVID-19 pandemic, which necessitated consideration that the patient might be at risk for infection with the SARS-CoV-2 virus that causes COVID-19. Institutional protocols and algorithms that pertain to the evaluation of patients at risk for COVID-19 are in a state of rapid change based on information released by regulatory bodies including the CDC and federal and state organizations. These policies and algorithms were followed during the patient's care in the ED.    Patient is an 85 year old who reports with decreased p.o. intake as well as shortness of breath.  Will get labs to evaluate for fluid overload, ACS, electrolyte abnormalities, coronavirus, hypothyroid..  EKG is concerning for a underlying atrial flutter.  Patient says that he normally his rate is  around 70.  I discussed with Dr. Ubaldo Glassing in cardiology who reported that he has a dual-chamber pacemaker and secondary to the atrial arrhythmia he was switched to VVI with a lower rate of 50.  Creatinine noted to be 2.8 with a potassium of 6.   Patient's creatinine on 4/1 was 1.5.  Patient says that his leg swelling is at his baseline.  He feels dehydrated since he is not been eating or drinking.  We will give some fluid given to him this is most likely secondary to dehydration.  Troponin was 14.  Updated. Dr. Ubaldo Glassing given patient's AKI thought most likely secondary dehydration will admit patient to medicine.  Discussed with Benjamine Mola from the hospital team and they will admit patient.    ____________________________________________   FINAL CLINICAL IMPRESSION(S) / ED DIAGNOSES   Final diagnoses:  Fatigue, unspecified type  Hyperkalemia  AKI (acute kidney injury) ([place])      MEDICATIONS GIVEN DURING THIS VISIT:  Medications  sodium chloride 0.9 % bolus 500 mL (500 mLs Intravenous New Bag/Given 09/10/18 1505)     ED Discharge Orders    None       Note:  This document was prepared using Dragon voice recognition software and may include unintentional dictation errors.   Vanessa [place], MD 09/10/18 [phone]

## 2018-09-10 NOTE — ED Notes (Signed)
Pacemaker interrogated at this time.

## 2018-09-11 ENCOUNTER — Inpatient Hospital Stay
Admit: 2018-09-11 | Discharge: 2018-09-11 | Disposition: A | Discharge status: Home / Self Care | Payer: Medicare Other | Attending: Internal Medicine | Admitting: Internal Medicine

## 2018-09-11 ENCOUNTER — Inpatient Hospital Stay: Payer: Medicare Other

## 2018-09-11 DIAGNOSIS — N179 Acute kidney failure, unspecified: Principal | ICD-10-CM

## 2018-09-11 LAB — PROTEIN / CREATININE RATIO, URINE
Creatinine, Urine: 174 mg/dL
Protein Creatinine Ratio: 0.18 mg/mg{Cre} — ABNORMAL HIGH (ref 0.00–0.15)
Total Protein, Urine: 31 mg/dL

## 2018-09-11 LAB — HEMOGLOBIN A1C
Hgb A1c MFr Bld: 6.1 % — ABNORMAL HIGH (ref 4.8–5.6)
Mean Plasma Glucose: 128.37 mg/dL

## 2018-09-11 LAB — GLUCOSE, CAPILLARY
Glucose-Capillary: 102 mg/dL — ABNORMAL HIGH (ref 70–99)
Glucose-Capillary: 132 mg/dL — ABNORMAL HIGH (ref 70–99)
Glucose-Capillary: 144 mg/dL — ABNORMAL HIGH (ref 70–99)
Glucose-Capillary: 116 mg/dL — ABNORMAL HIGH (ref 70–99)

## 2018-09-11 LAB — BASIC METABOLIC PANEL
Anion gap: 14 (ref 5–15)
BUN: 75 mg/dL — ABNORMAL HIGH (ref 8–23)
CO2: 20 mmol/L — ABNORMAL LOW (ref 22–32)
Calcium: 8.4 mg/dL — ABNORMAL LOW (ref 8.9–10.3)
Chloride: 98 mmol/L (ref 98–111)
Creatinine, Ser: 2.73 mg/dL — ABNORMAL HIGH (ref 0.61–1.24)
GFR calc Af Amer: 24 mL/min — ABNORMAL LOW (ref >60)
GFR calc non Af Amer: 21 mL/min — ABNORMAL LOW (ref >60)
Glucose, Bld: 116 mg/dL — ABNORMAL HIGH (ref 70–99)
Potassium: 5.7 mmol/L — ABNORMAL HIGH (ref 3.5–5.1)
Sodium: 132 mmol/L — ABNORMAL LOW (ref 135–145)

## 2018-09-11 LAB — CBC
HCT: 36.7 % — ABNORMAL LOW (ref 39.0–52.0)
Hemoglobin: 12.1 g/dL — ABNORMAL LOW (ref 13.0–17.0)
MCH: 30.3 pg (ref 26.0–34.0)
MCHC: 33 g/dL (ref 30.0–36.0)
MCV: 92 fL (ref 80.0–100.0)
Platelets: 307 10*3/uL (ref 150–400)
RBC: 3.99 MIL/uL — ABNORMAL LOW (ref 4.22–5.81)
RDW: 14.3 % (ref 11.5–15.5)
WBC: 11.5 10*3/uL — ABNORMAL HIGH (ref 4.0–10.5)
nRBC: 0 % (ref 0.0–0.2)

## 2018-09-11 LAB — ECHOCARDIOGRAM COMPLETE
Height: 71 in
Weight: 3924.8 oz

## 2018-09-11 MED ORDER — ADULT MULTIVITAMIN W/MINERALS CH
1.0000 | ORAL_TABLET | Freq: Every day | ORAL | Status: DC
  Administered 2018-09-12 – 2018-09-19 (×7): 1 via ORAL
  Filled 2018-09-11 (×7): qty 1

## 2018-09-11 MED ORDER — NEPRO/CARBSTEADY PO LIQD
237.0000 mL | Freq: Two times a day (BID) | ORAL | Status: DC
  Administered 2018-09-14: 237 mL via ORAL

## 2018-09-11 NOTE — Progress Notes (Addendum)
[facility] [place] NAME: Javier Wong    MR#:  [id number]  DATE OF BIRTH:  [birth year]  SUBJECTIVE:   Patient states he is continuing to feel weak this morning.  He feels like his shortness of breath is a little bit better than yesterday.  He denies any cough, fevers, chills.  REVIEW OF SYSTEMS:  Review of Systems  Constitutional: Positive for malaise/fatigue. Negative for chills and fever.  HENT: Negative for congestion and sore throat.   Eyes: Negative for blurred vision and double vision.  Respiratory: Positive for shortness of breath. Negative for cough.   Cardiovascular: Negative for chest pain and palpitations.  Gastrointestinal: Positive for nausea. Negative for abdominal pain and vomiting.  Genitourinary: Negative for dysuria and urgency.  Musculoskeletal: Negative for back pain and neck pain.  Neurological: Positive for weakness. Negative for dizziness, focal weakness and headaches.  Psychiatric/Behavioral: Negative for depression. The patient is not nervous/anxious.     DRUG ALLERGIES:  No Known Allergies VITALS:  Blood pressure 103/78, pulse (!) 49, temperature (!) 97.4 F (36.3 C), temperature source Oral, resp. rate 19, height 5\' 11"  (1.803 m), weight 111.3 kg, SpO2 96 %. PHYSICAL EXAMINATION:  Physical Exam  GENERAL:   Sitting up on the side of the bed, in no acute distress. HEENT: Head atraumatic, normocephalic. Pupils equal, round, reactive to light and accommodation. No scleral icterus. Extraocular muscles intact. Oropharynx and nasopharynx clear.  NECK:  Supple, no jugular venous distention. No thyroid enlargement. LUNGS: Lungs are clear to auscultation bilaterally. No wheezes, crackles, rhonchi. No use of accessory muscles of respiration.  CARDIOVASCULAR: Bradycardic, regular rhythm, S1, S2 normal. No murmurs, rubs, or gallops.  ABDOMEN: Soft, nontender, nondistended. Bowel sounds present.  EXTREMITIES: No  pedal edema, cyanosis, or clubbing.  NEUROLOGIC: CN 2-12 intact, no focal deficits. +global weakness. Sensation intact throughout. Gait not checked.  PSYCHIATRIC: The patient is alert and oriented x 3.  SKIN: No obvious rash, lesion, or ulcer.  LABORATORY PANEL:  Male CBC Recent Labs  Lab 09/11/18 0608  WBC 11.5*  HGB 12.1*  HCT 36.7*  PLT 307   ------------------------------------------------------------------------------------------------------------------ Chemistries  Recent Labs  Lab 09/10/18 1402  09/11/18 0608  NA 130*  --  132*  K 6.0*   < > 5.7*  CL 97*  --  98  CO2 16*  --  20*  GLUCOSE 122*  --  116*  BUN 70*  --  75*  CREATININE 2.84*  --  2.73*  CALCIUM 8.8*  --  8.4*  MG 2.6*  --   --   AST 19  --   --   ALT 14  --   --   ALKPHOS 71  --   --   BILITOT 1.0  --   --    < > = values in this interval not displayed.   RADIOLOGY:  Ct Chest Wo Contrast  Result Date: 09/11/2018 CLINICAL DATA:  Increasing weakness, fatigue and nausea. EXAM: CT CHEST WITHOUT CONTRAST TECHNIQUE: Multidetector CT imaging of the chest was performed following the standard protocol without IV contrast. COMPARISON:  Chest x-ray 09/10/2018 FINDINGS: Cardiovascular: The heart is normal in size for the patient's age. There is a moderate to large pericardial effusion. This appears to be simple fluid. The pacer wires are in good position without complicating features. There is mild tortuosity, ectasia and moderate atherosclerotic calcifications involving the thoracic aorta. Three-vessel coronary artery calcifications are noted. Mediastinum/Nodes: There is a  very large subcarinal mass measuring at least 7.7 x 4.9 x 5.1 cm. It appears to be contiguous with a right lower lobe soft tissue mass in the as ago esophageal recess. This also continues down to the diaphragmatic crus and there is adjacent probable nodal disease just above the celiac axis. A few other small scattered upper mediastinal lymph nodes.  I do not see any definite direct involvement of the esophagus. There is some high attenuation material the esophagus which could be debris or pills. Lungs/Pleura: Suspect right lower lobe mass in the as ago esophageal recess contiguous with the large mediastinal mass or adenopathy. Moderate bilateral pleural effusions along with fluid in both major fissures. There is an ill-defined 16 mm lesion in the right middle lobe which is indeterminate. Moderate vascular congestion and probable mild perihilar pulmonary edema. Upper Abdomen: Advanced cirrhotic changes involving the liver with a very irregular liver contour, dilated hepatic fissures and increased caudate to right lobe ratio. No obvious hepatic lesions without contrast. No splenomegaly. 2 cm nodal lesion noted just above the celiac axis and adjacent to the caudate lobe of the liver. Musculoskeletal: Indeterminate subcutaneous lesion involving the posterior lower thorax. Smaller adjacent lesion is also indeterminate. These could be benign complex sebaceous cysts. Mild symmetric bilateral gynecomastia. Left-sided permanent pacemaker without complicating features. Advanced degenerative changes involving the spine but no worrisome bone lesions. IMPRESSION: 1. 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 PET-CT or biopsy may be helpful for further evaluation. 2. Moderate-sized bilateral pleural effusions. 3. Moderate to large pericardial effusion. 4. Indeterminate 16 mm nodule in the right middle lobe, possibly a metastatic focus. 5. Advanced cirrhotic changes involving the liver without definite hepatic lesions. Aortic Atherosclerosis (ICD10-I70.0). Electronically Signed   By: Marijo Sanes M.D.   On: 09/11/2018 13:51   US Renal  Result Date: 09/11/2018 CLINICAL DATA:  Acute kidney injury EXAM: RENAL / URINARY TRACT ULTRASOUND  COMPLETE COMPARISON:  None. FINDINGS: Right Kidney: Renal measurements: 11.0 x 5.4 x 5.1 cm = volume: 159 mL. Slightly increased echotexture diffusely. Cortical thinning. No mass or hydronephrosis Left Kidney: Renal measurements: 11.2 x 5.9 x 5.7 cm = volume: 200 mL. Slightly increased echotexture diffusely with cortical thinning. No mass or hydronephrosis. Bladder: Appears normal for degree of bladder distention. Small amount of ascites seen within the abdomen. IMPRESSION: Increased echotexture and cortical thinning compatible with chronic medical renal disease. No acute findings. No hydronephrosis. Small amount of ascites incidentally noted. Electronically Signed   By: Rolm Baptise M.D.   On: 09/11/2018 10:52   ASSESSMENT AND PLAN:   Moderate pericardial effusion- seen on echo.  No evidence of tamponade physiology. -CT chest pending -Cardiology and cardiothoracic surgery following -Holding off on pericardial window for now -Will plan to repeat his echocardiogram early next week  Acute kidney injury- likely secondary to NSAID use and poor cardiac output. -Renal ultrasound showed medical renal disease, but no acute abnormalities -Nephrology consulted -Avoid nephrotoxic agents -Continue gentle IV fluids  Hyperkalemia- likely secondary to above.  Potassium improving. -Continue Lokelma 10 mg p.o. 3 times daily per nephrology recommendations  Chronic atrial fibrillation with dual-chamber pacemaker in place -Cardiology following  Deconditioning- likely due to above -PT consult  Type 2 diabetes -Continue SSI  Chronic diastolic congestive heart failure- patient does not appear volume overloaded. -Receiving gentle IVFs  All the records are  reviewed and case discussed with Care Management/Social Worker. Management plans discussed with the patient, family and they are in agreement.  CODE STATUS: Full Code  TOTAL TIME TAKING CARE OF THIS PATIENT: 35 minutes.   More than 50% of the time  was spent in counseling/coordination of care: YES  POSSIBLE D/C IN 3-4 DAYS, DEPENDING ON CLINICAL CONDITION.   Berna Spare Vinicius Brockman M.D on 09/11/2018 at 3:04 PM  Between 7am to 6pm - Pager [phone]  After 6pm go to [email or web address] - Proofreader  [facility]  Office  [phone]  CC: Primary care physician; Glendon Axe, MD  Note: This dictation was prepared with Dragon dictation along with smaller phrase technology. Any transcriptional errors that result from this process are unintentional.

## 2018-09-11 NOTE — Progress Notes (Signed)
Patient ID: Javier Wong, male   DOB: 03-29-1932, 85 y.o.   MRN: [id number]  Chief Complaint  Patient presents with  . Fatigue  . Pacemaker Problem    Referred By Dr. Bartholome Bill Reason for Referral pericardial effusion  HPI Location, Quality, Duration, Severity, Timing, Context, Modifying Factors, Associated Signs and Symptoms.  Javier Wong is a 85 y.o. male.  This gentleman is an 85 year old man who was in his usual state of health until about a month ago when he began experiencing what he describes as myalgias.  He has taken Aleve in the past and began taking 400 mg of Aleve 3 times a day.  He states that he took that for a while and then contacted his primary care doctor told him not to take that any longer.  He continued to feel poorly and complained of some shortness of breath as well.  He called his son who recommended that he come to the emergency department.  When he came to the emergency department a chest x-ray was made.  This does not reveal a very large cardiac silhouette but he was admitted to the hospital and was tested negative for COVID.  In addition his routine laboratory studies showed an acute kidney injury with a creatinine between 2 and 3 and hyperkalemia.  He had an echocardiogram done and that revealed a small to moderate size pericardial effusion without any complicating features.  An echo did not reveal any evidence of [place] nod.  A chest CT without infusion did not reveal any obvious lung or other pathology except for the pericardial effusion.  He does have a history of complete heart block and has a permanent pacemaker in place.  Since he has been in the hospital he states that he does feel slightly better.  He is wearing a mask at this time.   No past medical history on file.   No family history on file.  Social History Social History   Tobacco Use  . Smoking status: Never Smoker  Substance Use Topics  . Alcohol use: Never    Frequency: Never  .  Drug use: Never    No Known Allergies  Current Facility-Administered Medications  Medication Dose Route Frequency Provider Last Rate Last Dose  . acetaminophen (TYLENOL) tablet 650 mg  650 mg Oral Q6H PRN Epifanio Lesches, MD       Or  . acetaminophen (TYLENOL) suppository 650 mg  650 mg Rectal Q6H PRN Epifanio Lesches, MD      . bisacodyl (DULCOLAX) EC tablet 5 mg  5 mg Oral Daily PRN Epifanio Lesches, MD      . docusate sodium (COLACE) capsule 100 mg  100 mg Oral BID Epifanio Lesches, MD      . feeding supplement (NEPRO CARB STEADY) liquid 237 mL  237 mL Oral BID BM Mayo, Pete Pelt, MD      . heparin injection 5,000 Units  5,000 Units Subcutaneous Q8H Epifanio Lesches, MD   5,000 Units at 09/11/18 [phone]  . HYDROcodone-acetaminophen (NORCO/VICODIN) 5-325 MG per tablet 1 tablet  1 tablet Oral Q4H PRN Epifanio Lesches, MD      . insulin aspart (novoLOG) injection 0-9 Units  0-9 Units Subcutaneous TID WC Epifanio Lesches, MD   1 Units at 09/11/18 1222  . [START ON 09/12/2018] multivitamin with minerals tablet 1 tablet  1 tablet Oral Daily Mayo, Pete Pelt, MD      . ondansetron [facility]) tablet 4 mg  4 mg Oral Q6H  PRN Epifanio Lesches, MD       Or  . ondansetron [facility]) injection 4 mg  4 mg Intravenous Q6H PRN Epifanio Lesches, MD      . sodium chloride 0.225 % with sodium bicarbonate 100 mEq infusion   Intravenous Continuous Epifanio Lesches, MD 75 mL/hr at 09/11/18 [phone]    . sodium zirconium cyclosilicate (LOKELMA) packet 10 g  10 g Oral TID Epifanio Lesches, MD   10 g at 09/11/18 1119  . traZODone (DESYREL) tablet 25 mg  25 mg Oral QHS PRN Epifanio Lesches, MD   25 mg at 09/11/18 0159      Review of Systems A complete review of systems was asked and was negative except for the following positive findings he has had some significant weight loss.  He complains of shortness of breath.  He denied any fevers or chills.  Blood pressure 103/78,  pulse (!) 49, temperature (!) 97.4 F (36.3 C), temperature source Oral, resp. rate 19, height 5\' 11"  (1.803 m), weight 111.3 kg, SpO2 96 %.  Physical Exam CONSTITUTIONAL:  Pleasant, well-developed, well-nourished, and in no acute distress. EYES: Pupils equal and reactive to light, Sclera non-icteric EARS, NOSE, MOUTH AND THROAT:  The oropharynx was clear.  Dentition is poor repair.  Oral mucosa pink and moist. LYMPH NODES:  Lymph nodes in the neck and axillae were normal RESPIRATORY:  Lungs were clear.  Normal respiratory effort without pathologic use of accessory muscles of respiration CARDIOVASCULAR: Heart was regular without murmurs.  There were no carotid bruits. GI: The abdomen was soft, nontender, and nondistended. There were no palpable masses. There was no hepatosplenomegaly. There were normal bowel sounds in all quadrants. GU:  Rectal deferred.   MUSCULOSKELETAL:  Normal muscle strength and tone.  No clubbing or cyanosis.   SKIN:  There were no pathologic skin lesions.  There were no nodules on palpation. NEUROLOGIC:  Sensation is normal.  Cranial nerves are grossly intact. PSYCH:  Oriented to person, place and time.  Mood and affect are normal.  Data Reviewed CT scan and chest x-rays  I have personally reviewed the patient's imaging, laboratory findings and medical records.    Assessment    Pericardial effusion moderate in size without evidence of [place] nod    Plan    I did review with the patient and with Dr. Ubaldo Glassing the indications and risks of pericardial window.  At the present time my recommendation be to repeat his echocardiogram early next week.  He is receiving all the appropriate therapy for his acute kidney injury.  The only other suggestion be to increase his ventricular rate on his pacemaker which may improve his cardiac output.  I will continue to follow the patient with you.       Nestor Lewandowsky, MD 09/11/2018, 2:28 PM   Patient ID: Javier Wong, male    DOB: 03/08/1932, 85 y.o.   MRN: [id number]

## 2018-09-11 NOTE — Progress Notes (Signed)
Initial Nutrition Assessment  DOCUMENTATION CODES:   Obesity unspecified  INTERVENTION:   Nepro Shake po BID, each supplement provides 425 kcal and 19 grams protein  MVI daily   Pt likely at moderate refeed risk; recommend monitor K, Mg and P labs daily until stable  NUTRITION DIAGNOSIS:   Inadequate oral intake related to acute illness as evidenced by per patient/family report.  GOAL:   Patient will meet greater than or equal to 90% of their needs  MONITOR:   PO intake, Supplement acceptance, Labs, Weight trends, Skin, I & O's  REASON FOR ASSESSMENT:   Malnutrition Screening Tool    ASSESSMENT:   85 y.o. male with a PMHx of hypertension, hyperlipidemia, chronic kidney disease stage III baseline creatinine 1.5, EGFR 45, history of complete heart block status post dual chamber pacemaker placement, who was admitted to [facility] on 09/10/2018 for evaluation of weakness, weakness, and poor p.o. intake.  RD working remotely.  Pt reports poor appetite and oral intake for 4 weeks pta r/t nausea. Pt with improved appetite and oral intake in hospital; pt ate 75% of his breakfast this morning. RD will add supplements and MVI to help pt meet his estimated needs. Per chart, pt appears weight stable pta.    Medications reviewed and include: colace, heparin, insulin, lokelma, Na Bicarb  Labs reviewed: Na 132(L), K 5.7(H), BUN 75(H), creat 2.73(H) Wbc 11.5(H)  Unable to complete Nutrition-Focused physical exam at this time.   Diet Order:   Diet Order            Diet NPO time specified  Diet effective now             EDUCATION NEEDS:   No education needs have been identified at this time  Skin:  Skin Assessment: Reviewed RN Assessment(ecchymosis)  Last BM:  pta  Height:   Ht Readings from Last 1 Encounters:  09/10/18 5' 11" (1.803 m)    Weight:   Wt Readings from Last 1 Encounters:  09/11/18 111.3 kg    Ideal Body Weight:  78.2 kg  BMI:  Body mass index is 34.21  kg/m.  Estimated Nutritional Needs:   Kcal:  2200-2500kcal/day  Protein:  110-125g/day  Fluid:  >2L/day  Koleen Distance MS, RD, LDN Pager #- [phone] Office#- [phone] After Hours Pager: [phone]

## 2018-09-11 NOTE — Progress Notes (Signed)
*  PRELIMINARY RESULTS* Echocardiogram 2D Echocardiogram has been performed.  Javier Wong 09/11/2018, 9:33 AM

## 2018-09-11 NOTE — Consult Note (Signed)
Cardiology Consultation Note    Patient ID: Javier Wong, MRN: [id number], DOB/AGE: [birth year] 85 y.o. Admit date: 09/10/2018   Date of Consult: 09/11/2018 Primary Physician: Glendon Axe, MD Primary Cardiologist: Dr. Saralyn Pilar  Chief Complaint: nausea and weakness Reason for Consultation: nausea and weakness Requesting MD: Dr. Brett Albino  HPI: Javier Wong is a 85 y.o. male with history of hypertension, hyperlipidemia, chronic kidney disease with type 2 diabetes and history of complete heart block with a dual-chamber pacemaker in place.  He presented to the emergency room with history of gradually increasing weakness fatigue with nausea.  He was noted to have acute on chronic renal insufficiency with a serum creatinine of 2.84 increased from a baseline of 1.25.  GFR was 20.  His serum potassium was 6.0.  BUN was 70.  Electrocardiogram showed atrial flutter with ventricular pacing at his ventricular backup rate of 50 bpm which is where his pacemaker is set.  He was hemodynamically stable but relatively hypotensive.  Chest x-ray revealed stable cardiomegaly from a new radiograph of 2012.  He had no pleural effusion.  No pulmonary edema.  Mild bibasilar segmental atelectasis.  Echocardiogram done today revealed preserved LV function with mild to moderate pericardial effusion with no tamponade physiology.  He denied abdominal pain or diarrhea.  His pulse ox was 92%.  Etiology of this is unclear but does not appear acute as it is fairly large and without evidence of acute tamponade.  May be secondary to renal insufficiency.  No past medical history on file.   Hypertension Diabetes Mild chronic kidney disease. Complete heart block with backup pacing at 50 bpm Surgical History:  Status post dual-chamber pacemaker Home Meds: Prior to Admission medications   Medication Sig Start Date End Date Taking? Authorizing Provider  allopurinol (ZYLOPRIM) 100 MG tablet Take 100 mg by mouth daily.  07/10/18  Yes [provider]  aspirin EC 81 MG tablet Take 81 mg by mouth daily.   Yes [provider]  cyanocobalamin (,VITAMIN B-12,) 1000 MCG/ML injection Inject 1,000 mcg into the muscle every 30 (thirty) days. 03/24/18  Yes [provider]  furosemide (LASIX) 40 MG tablet Take 40 mg by mouth daily. 02/20/18  Yes [provider]  hydrochlorothiazide (HYDRODIURIL) 25 MG tablet Take 25 mg by mouth daily. 02/20/18  Yes [provider]  insulin lispro protamine-lispro (HUMALOG 75/25 MIX) (75-25) 100 UNIT/ML SUSP injection Inject 10 Units into the skin daily with breakfast. 02/20/18  Yes [provider]  metFORMIN (GLUCOPHAGE) 1000 MG tablet Take 1,000 mg by mouth 2 (two) times a day. 03/31/18  Yes [provider]  naproxen sodium (ALEVE) 220 MG tablet Take 220 mg by mouth daily as needed.   Yes [provider]  ramipril (ALTACE) 10 MG capsule Take 10 mg by mouth 2 (two) times a day. 07/10/18  Yes [provider]  simvastatin (ZOCOR) 40 MG tablet Take 40 mg by mouth at bedtime. 02/20/18  Yes [provider]  traMADol (ULTRAM) 50 MG tablet Take 50 mg by mouth every 8 (eight) hours as needed for pain. 04/14/18  Yes [provider]    Inpatient Medications:  . docusate sodium  100 mg Oral BID  . heparin  5,000 Units Subcutaneous Q8H  . insulin aspart  0-9 Units Subcutaneous TID WC  . sodium zirconium cyclosilicate  10 g Oral TID   .  sodium bicarbonate infusion 1/4 NS 1000 mL 75 mL/hr at 09/11/18 0648    Allergies:  No Known Allergies  Social History   Socioeconomic History  . Marital status: Married    Spouse name: Not on file  . Number of children: Not on file  . Years of education: Not on file  . Highest education level: Not on file  Occupational History  . Not on file  Social Needs  . Financial resource strain: Not on file  . Food insecurity    Worry: Not on file    Inability: Not on file  .  Transportation needs    Medical: Not on file    Non-medical: Not on file  Tobacco Use  . Smoking status: Never Smoker  Substance and Sexual Activity  . Alcohol use: Never    Frequency: Never  . Drug use: Never  . Sexual activity: Not Currently  Lifestyle  . Physical activity    Days per week: Not on file    Minutes per session: Not on file  . Stress: Not on file  Relationships  . Social Herbalist on phone: Not on file    Gets together: Not on file    Attends religious service: Not on file    Active member of club or organization: Not on file    Attends meetings of clubs or organizations: Not on file    Relationship status: Not on file  . Intimate partner violence    Fear of current or ex partner: Not on file    Emotionally abused: Not on file    Physically abused: Not on file    Forced sexual activity: Not on file  Other Topics Concern  . Not on file  Social History Narrative  . Not on file     No family history on file.   Review of Systems: A 12-system review of systems was performed and is negative except as noted in the HPI.  Labs: No results for input(s): CKTOTAL, CKMB, TROPONINI in the last 72 hours. Lab Results  Component Value Date   WBC 11.5 (H) 09/11/2018   HGB 12.1 (L) 09/11/2018   HCT 36.7 (L) 09/11/2018   MCV 92.0 09/11/2018   PLT 307 09/11/2018    Recent Labs  Lab 09/10/18 1402  09/11/18 0608  NA 130*  --  132*  K 6.0*   < > 5.7*  CL 97*  --  98  CO2 16*  --  20*  BUN 70*  --  75*  CREATININE 2.84*  --  2.73*  CALCIUM 8.8*  --  8.4*  PROT 7.7  --   --   BILITOT 1.0  --   --   ALKPHOS 71  --   --   ALT 14  --   --   AST 19  --   --   GLUCOSE 122*  --  116*   < > = values in this interval not displayed.   No results found for: CHOL, HDL, LDLCALC, TRIG No results found for: DDIMER  Radiology/Studies:  Dg Chest Portable 1 View  Result Date: 09/10/2018 CLINICAL DATA:  Shortness of breath. EXAM: PORTABLE CHEST 1 VIEW  COMPARISON:  Radiograph of April 06, 2010. FINDINGS: Stable cardiomegaly. Left-sided pacemaker is unchanged in position. No pneumothorax is noted. No significant pleural effusion is noted. Minimal bibasilar subsegmental atelectasis is noted. Degenerative changes are seen involving both glenohumeral joints. IMPRESSION: Minimal bibasilar subsegmental atelectasis. Electronically Signed   By: Marijo Conception M.D.   On: 09/10/2018 14:47    Wt Readings from  Last 3 Encounters:  09/11/18 111.3 kg    EKG: Atrial tachycardia/flutter with ventricular pacing  Physical Exam:  Blood pressure 103/78, pulse (!) 49, temperature (!) 97.4 F (36.3 C), temperature source Oral, resp. rate 19, height 5\' 11"  (1.803 m), weight 111.3 kg, SpO2 96 %. Body mass index is 34.21 kg/m. General: Well developed, well nourished, in no acute distress. Head: Normocephalic, atraumatic, sclera non-icteric, no xanthomas, nares are without discharge.  Neck: Negative for carotid bruits. JVD not elevated. Lungs: Clear bilaterally to auscultation without wheezes, rales, or rhonchi. Breathing is unlabored. Heart: RRR with S1 S2.  Abdomen: Soft, non-tender, non-distended with normoactive bowel sounds. No hepatomegaly. No rebound/guarding. No obvious abdominal masses. Msk:  Strength and tone appear normal for age. Extremities: No clubbing or cyanosis. No edema.  Distal pedal pulses are 2+ and equal bilaterally. Neuro: Alert and oriented X 3. No facial asymmetry. No focal deficit. Moves all extremities spontaneously. Psych:  Responds to questions appropriately with a normal affect.     Assessment and Plan  Patient with history of complete heart block, hypertension, diabetes and mild chronic kidney disease admitted with several weeks history of progressive shortness of breath and fatigue.  Chest x-ray showed no pulmonary edema with stable cardiomegaly.  Laboratories revealed acute on chronic renal insufficiency with a creatinine of  2.8 up from 1.25.  Relatively hypotensive but not requiring pressors.  Has had anorexia but no nausea or vomiting or diarrhea.  Echocardiogram done today revealed a least moderate pericardial effusion with no evidence of tamponade physiology.  This is circumferential.  RV appears to be expanding.  LV function appears normal.  He has a dual-chamber pacemaker that has mode switch to VVI backup pacing at the rate of 50 which is where his program.  Etiology of effusion is unclear.  He is likely symptomatic from this and therefore will need to consider pericardial window.  I have asked Dr. Nestor Lewandowsky to evaluate the patient for this.  This would be for diagnostic and therapeutic purposes.  Does not appear to require acute pericardiocentesis at present.  Signed, Teodoro Spray MD 09/11/2018, 10:12 AM Pager: [phone]

## 2018-09-11 NOTE — Consult Note (Signed)
[facility] CONSULT NOTE    Date: 09/11/2018                  Patient Name:  Javier Wong  MRN: [id number]  DOB: Apr 27, 1932  Age / Sex: 85 y.o., male         PCP: Glendon Axe, MD                 Service Requesting Consult:  Hospitalist                 Reason for Consult:  Acute renal failure, chronic kidney disease stage III            History of Present Illness: Patient is a 85 y.o. male with a PMHx of hypertension, hyperlipidemia, chronic kidney disease stage III baseline creatinine 1.5, EGFR 45, history of complete heart block status post dual chamber pacemaker placement, who was admitted to [facility] on 09/10/2018 for evaluation of weakness, weakness, and poor p.o. intake.  Upon evaluation at the emergency department he was found to have acute renal failure.  Creatinine was found to be 2.84 with an EGFR of 20.  Previously his baseline creatinine was 1.5 with an EGFR of 45.  He was also noted as having hyperkalemia with a serum potassium of 5.7.  Patient also had 2D echocardiogram performed today which demonstrated pericardial effusion.  Cardiology has been consulted.  For his hyperkalemia the patient has been started on Lokelma 10 g p.o. 3 times daily.  He is also been started on sodium bicarbonate drip.  Patient also reports that he has been taking Aleve at home.   Medications: Outpatient medications: Medications Prior to Admission  Medication Sig Dispense Refill Last Dose  . allopurinol (ZYLOPRIM) 100 MG tablet Take 100 mg by mouth daily.   09/10/2018 at 0800  . aspirin EC 81 MG tablet Take 81 mg by mouth daily.   09/10/2018 at 0800  . cyanocobalamin (,VITAMIN B-12,) 1000 MCG/ML injection Inject 1,000 mcg into the muscle every 30 (thirty) days.   Past Month at Unknown time  . furosemide (LASIX) 40 MG tablet Take 40 mg by mouth daily.   09/10/2018 at 0800  . hydrochlorothiazide (HYDRODIURIL) 25 MG tablet Take 25 mg by mouth daily.   09/10/2018 at 0800  .  insulin lispro protamine-lispro (HUMALOG 75/25 MIX) (75-25) 100 UNIT/ML SUSP injection Inject 10 Units into the skin daily with breakfast.   09/10/2018 at 0800  . metFORMIN (GLUCOPHAGE) 1000 MG tablet Take 1,000 mg by mouth 2 (two) times a day.   09/10/2018 at 0800  . naproxen sodium (ALEVE) 220 MG tablet Take 220 mg by mouth daily as needed.   09/10/2018 at 0800  . ramipril (ALTACE) 10 MG capsule Take 10 mg by mouth 2 (two) times a day.   09/10/2018 at 0800  . simvastatin (ZOCOR) 40 MG tablet Take 40 mg by mouth at bedtime.   09/09/2018 at 2000  . traMADol (ULTRAM) 50 MG tablet Take 50 mg by mouth every 8 (eight) hours as needed for pain.   09/10/2018 at 0800    Current medications: Current Facility-Administered Medications  Medication Dose Route Frequency Provider Last Rate Last Dose  . acetaminophen (TYLENOL) tablet 650 mg  650 mg Oral Q6H PRN Epifanio Lesches, MD       Or  . acetaminophen (TYLENOL) suppository 650 mg  650 mg Rectal Q6H PRN Epifanio Lesches, MD      . bisacodyl (DULCOLAX) EC tablet 5 mg  5 mg Oral Daily PRN Epifanio Lesches, MD      . docusate sodium (COLACE) capsule 100 mg  100 mg Oral BID Epifanio Lesches, MD      . heparin injection 5,000 Units  5,000 Units Subcutaneous Q8H Epifanio Lesches, MD   5,000 Units at 09/11/18 [phone]  . HYDROcodone-acetaminophen (NORCO/VICODIN) 5-325 MG per tablet 1 tablet  1 tablet Oral Q4H PRN Epifanio Lesches, MD      . insulin aspart (novoLOG) injection 0-9 Units  0-9 Units Subcutaneous TID WC Epifanio Lesches, MD      . ondansetron (ZOFRAN) tablet 4 mg  4 mg Oral Q6H PRN Epifanio Lesches, MD       Or  . ondansetron (ZOFRAN) injection 4 mg  4 mg Intravenous Q6H PRN Epifanio Lesches, MD      . sodium chloride 0.225 % with sodium bicarbonate 100 mEq infusion   Intravenous Continuous Epifanio Lesches, MD 75 mL/hr at 09/11/18 7262    . sodium zirconium cyclosilicate (LOKELMA) packet 10 g  10 g Oral TID Epifanio Lesches, MD   10 g at 09/11/18 1119  . traZODone (DESYREL) tablet 25 mg  25 mg Oral QHS PRN Epifanio Lesches, MD   25 mg at 09/11/18 0159      Allergies: No Known Allergies    Past Medical History: hypertension, hyperlipidemia, chronic kidney disease stage III baseline creatinine 1.5, EGFR 45, history of complete heart block status post dual chamber pacemaker placement  Past Surgical History: Status post pacemaker placement  Family History: No family history of ESRD.  Social History: Social History   Socioeconomic History  . Marital status: Married    Spouse name: Not on file  . Number of children: Not on file  . Years of education: Not on file  . Highest education level: Not on file  Occupational History  . Not on file  Social Needs  . Financial resource strain: Not on file  . Food insecurity    Worry: Not on file    Inability: Not on file  . Transportation needs    Medical: Not on file    Non-medical: Not on file  Tobacco Use  . Smoking status: Never Smoker  Substance and Sexual Activity  . Alcohol use: Never    Frequency: Never  . Drug use: Never  . Sexual activity: Not Currently  Lifestyle  . Physical activity    Days per week: Not on file    Minutes per session: Not on file  . Stress: Not on file  Relationships  . Social Herbalist on phone: Not on file    Gets together: Not on file    Attends religious service: Not on file    Active member of club or organization: Not on file    Attends meetings of clubs or organizations: Not on file    Relationship status: Not on file  . Intimate partner violence    Fear of current or ex partner: Not on file    Emotionally abused: Not on file    Physically abused: Not on file    Forced sexual activity: Not on file  Other Topics Concern  . Not on file  Social History Narrative  . Not on file     Review of Systems: Review of Systems  Constitutional: Positive for malaise/fatigue. Negative for  chills and fever.  HENT: Negative for ear pain, hearing loss and tinnitus.   Eyes: Negative for blurred vision and double vision.  Respiratory: Positive for shortness of breath. Negative for sputum production.   Cardiovascular: Positive for orthopnea and leg swelling. Negative for chest pain and palpitations.  Gastrointestinal: Positive for nausea and vomiting.  Genitourinary: Negative for dysuria and urgency.  Musculoskeletal: Negative for joint pain and myalgias.  Neurological: Positive for weakness. Negative for dizziness.  Endo/Heme/Allergies: Negative for polydipsia. Does not bruise/bleed easily.  Psychiatric/Behavioral: Negative for depression. The patient is not nervous/anxious.      Vital Signs: Blood pressure 103/78, pulse (!) 49, temperature (!) 97.4 F (36.3 C), temperature source Oral, resp. rate 19, height 5' 11"  (1.803 m), weight 111.3 kg, SpO2 96 %.  Weight trends: Filed Weights   09/10/18 1356 09/10/18 1853 09/11/18 0421  Weight: 108.9 kg 110.9 kg 111.3 kg    Physical Exam: General: NAD, sitting up in bed  Head: Normocephalic, atraumatic.  Eyes: Anicteric, EOMI  Nose: Mucous membranes moist, not inflammed, nonerythematous.  Throat: Oropharynx nonerythematous, no exudate appreciated.   Neck: Supple, trachea midline.  Lungs:  Normal effort, bilateral rales  Heart: S1S2 no obvious rub, irregular  Abdomen:  BS normoactive. Soft, Nondistended, non-tender.  No masses or organomegaly.  Extremities: trace pretibial edema.  Neurologic: A&O X3, Motor strength is 5/5 in the all 4 extremities  Skin: No visible rashes, scars.    Lab results: Basic Metabolic Panel: Recent Labs  Lab 09/10/18 1402 09/10/18 1952 09/11/18 0608  NA 130*  --  132*  K 6.0* 5.6* 5.7*  CL 97*  --  98  CO2 16*  --  20*  GLUCOSE 122*  --  116*  BUN 70*  --  75*  CREATININE 2.84*  --  2.73*  CALCIUM 8.8*  --  8.4*  MG 2.6*  --   --     Liver Function Tests: Recent Labs  Lab  09/10/18 1402  AST 19  ALT 14  ALKPHOS 71  BILITOT 1.0  PROT 7.7  ALBUMIN 3.7   No results for input(s): LIPASE, AMYLASE in the last 168 hours. No results for input(s): AMMONIA in the last 168 hours.  CBC: Recent Labs  Lab 09/10/18 1402 09/11/18 0608  WBC 14.3* 11.5*  NEUTROABS 12.9*  --   HGB 13.5 12.1*  HCT 41.4 36.7*  MCV 91.8 92.0  PLT 420* 307    Cardiac Enzymes: No results for input(s): CKTOTAL, CKMB, CKMBINDEX, TROPONINI in the last 168 hours.  BNP: Invalid input(s): POCBNP  CBG: Recent Labs  Lab 09/10/18 2129 09/11/18 0733 09/11/18 1123  GLUCAP 111* 102* 132*    Microbiology: Results for orders placed or performed during the hospital encounter of 09/10/18  SARS Coronavirus 2 (CEPHEID - Performed in [facility] hospital lab), Hosp Order     Status: None   Collection Time: 09/10/18  2:03 PM   Specimen: Nasopharyngeal Swab  Result Value Ref Range Status   SARS Coronavirus 2 NEGATIVE NEGATIVE Final    Comment: (NOTE) If result is NEGATIVE SARS-CoV-2 target nucleic acids are NOT DETECTED. The SARS-CoV-2 RNA is generally detectable in upper and lower  respiratory specimens during the acute phase of infection. The lowest  concentration of SARS-CoV-2 viral copies this assay can detect is 250  copies / mL. A negative result does not preclude SARS-CoV-2 infection  and should not be used as the sole basis for treatment or other  patient management decisions.  A negative result may occur with  improper specimen collection / handling, submission of specimen other  than nasopharyngeal swab, presence of viral mutation(s)  within the  areas targeted by this assay, and inadequate number of viral copies  (<250 copies / mL). A negative result must be combined with clinical  observations, patient history, and epidemiological information. If result is POSITIVE SARS-CoV-2 target nucleic acids are DETECTED. The SARS-CoV-2 RNA is generally detectable in upper and lower   respiratory specimens dur ing the acute phase of infection.  Positive  results are indicative of active infection with SARS-CoV-2.  Clinical  correlation with patient history and other diagnostic information is  necessary to determine patient infection status.  Positive results do  not rule out bacterial infection or co-infection with other viruses. If result is PRESUMPTIVE POSTIVE SARS-CoV-2 nucleic acids MAY BE PRESENT.   A presumptive positive result was obtained on the submitted specimen  and confirmed on repeat testing.  While 2019 novel coronavirus  (SARS-CoV-2) nucleic acids may be present in the submitted sample  additional confirmatory testing may be necessary for epidemiological  and / or clinical management purposes  to differentiate between  SARS-CoV-2 and other Sarbecovirus currently known to infect humans.  If clinically indicated additional testing with an alternate test  methodology [phone]) is advised. The SARS-CoV-2 RNA is generally  detectable in upper and lower respiratory sp ecimens during the acute  phase of infection. The expected result is Negative. Fact Sheet for Patients:  StrictlyIdeas.no Fact Sheet for Healthcare Providers: BankingDealers.co.za This test is not yet approved or cleared by the [place] FDA and has been authorized for detection and/or diagnosis of SARS-CoV-2 by FDA under an Emergency Use Authorization (EUA).  This EUA will remain in effect (meaning this test can be used) for the duration of the COVID-19 declaration under Section 564(b)(1) of the Act, 21 U.S.C. section 360bbb-3(b)(1), unless the authorization is terminated or revoked sooner. Performed at [facility], [address] [place], [place] [address]     Coagulation Studies: Recent Labs    09/10/18 1402  LABPROT 16.1*  INR 1.3*    Urinalysis: No results for input(s): COLORURINE, LABSPEC, PHURINE, GLUCOSEU,  HGBUR, BILIRUBINUR, KETONESUR, PROTEINUR, UROBILINOGEN, NITRITE, LEUKOCYTESUR in the last 72 hours.  Invalid input(s): APPERANCEUR    Imaging: US Renal  Result Date: 09/11/2018 CLINICAL DATA:  Acute kidney injury EXAM: RENAL / URINARY TRACT ULTRASOUND COMPLETE COMPARISON:  None. FINDINGS: Right Kidney: Renal measurements: 11.0 x 5.4 x 5.1 cm = volume: 159 mL. Slightly increased echotexture diffusely. Cortical thinning. No mass or hydronephrosis Left Kidney: Renal measurements: 11.2 x 5.9 x 5.7 cm = volume: 200 mL. Slightly increased echotexture diffusely with cortical thinning. No mass or hydronephrosis. Bladder: Appears normal for degree of bladder distention. Small amount of ascites seen within the abdomen. IMPRESSION: Increased echotexture and cortical thinning compatible with chronic medical renal disease. No acute findings. No hydronephrosis. Small amount of ascites incidentally noted. Electronically Signed   By: Rolm Baptise M.D.   On: 09/11/2018 10:52   Dg Chest Portable 1 View  Result Date: 09/10/2018 CLINICAL DATA:  Shortness of breath. EXAM: PORTABLE CHEST 1 VIEW COMPARISON:  Radiograph of April 06, 2010. FINDINGS: Stable cardiomegaly. Left-sided pacemaker is unchanged in position. No pneumothorax is noted. No significant pleural effusion is noted. Minimal bibasilar subsegmental atelectasis is noted. Degenerative changes are seen involving both glenohumeral joints. IMPRESSION: Minimal bibasilar subsegmental atelectasis. Electronically Signed   By: Marijo Conception M.D.   On: 09/10/2018 14:47      Assessment & Plan: Pt is a 85 y.o. male with a PMHx of hypertension, hyperlipidemia, chronic kidney  disease stage III baseline creatinine 1.5, EGFR 45, history of complete heart block status post dual chamber pacemaker placement, who was admitted to [facility] on 09/10/2018 for evaluation of weakness, weakness, and poor p.o. intake.  1.  Acute renal failure, question secondary to altered cardiorenal  hemodynamics. 2.  Chronic kidney disease stage III baseline creatinine 1.5, EGFR 45. 3.  Pericardial effusion, moderate in size, cardiology following, no acute tamponade physiology. 4.  Anemia of chronic kidney disease. 5.  Hyperkalemia. 6.  Metabolic acidosis.  Plan: We are asked to see the patient for evaluation management of acute renal failure and chronic kidney disease stage III in the setting of pericardial effusion.  Unifying diagnosis unclear at the moment.  Recommend checking ANA to exclude lupus as a cause.  Cardiology has discussed the case with Dr. Genevive Bi who may consider pericardial window.  Continue gentle IV fluid hydration with sodium bicarbonate drip.  Also maintain the patient on Lokelma 10 g p.o. 3 times daily to treat hyperkalemia.  No urgent indication for dialysis at the moment.  Further plan based upon diagnostic work-up.  Thanks for consultation.

## 2018-09-11 NOTE — Progress Notes (Signed)
PT Cancellation Note  Patient Details Name: Javier Wong MRN: [id number] DOB: 03-19-1932   Cancelled Treatment:    Reason Eval/Treat Not Completed: Other (comment). Pt currently with K+ at 5.7, out of range for participation in PT. Will hold this date.   Exilda Wilhite 09/11/2018, 9:50 AM  Greggory Stallion, PT, DPT [phone]

## 2018-09-11 NOTE — Plan of Care (Signed)
  Problem: Activity: Goal: Activity intolerance will improve Outcome: Progressing   Problem: Respiratory: Goal: Respiratory symptoms related to disease process will be avoided Outcome: Progressing

## 2018-09-12 DIAGNOSIS — I3139 Other pericardial effusion (noninflammatory): Secondary | ICD-10-CM

## 2018-09-12 DIAGNOSIS — I313 Pericardial effusion (noninflammatory): Secondary | ICD-10-CM

## 2018-09-12 LAB — COMPREHENSIVE METABOLIC PANEL
ALT: 15 U/L (ref 0–44)
AST: 18 U/L (ref 15–41)
Albumin: 3 g/dL — ABNORMAL LOW (ref 3.5–5.0)
Alkaline Phosphatase: 64 U/L (ref 38–126)
Anion gap: 13 (ref 5–15)
BUN: 72 mg/dL — ABNORMAL HIGH (ref 8–23)
CO2: 23 mmol/L (ref 22–32)
Calcium: 8.2 mg/dL — ABNORMAL LOW (ref 8.9–10.3)
Chloride: 95 mmol/L — ABNORMAL LOW (ref 98–111)
Creatinine, Ser: 2.51 mg/dL — ABNORMAL HIGH (ref 0.61–1.24)
GFR calc Af Amer: 26 mL/min — ABNORMAL LOW (ref >60)
GFR calc non Af Amer: 23 mL/min — ABNORMAL LOW (ref >60)
Glucose, Bld: 123 mg/dL — ABNORMAL HIGH (ref 70–99)
Potassium: 4.7 mmol/L (ref 3.5–5.1)
Sodium: 131 mmol/L — ABNORMAL LOW (ref 135–145)
Total Bilirubin: 0.8 mg/dL (ref 0.3–1.2)
Total Protein: 6.2 g/dL — ABNORMAL LOW (ref 6.5–8.1)

## 2018-09-12 LAB — GLUCOSE, CAPILLARY
Glucose-Capillary: 98 mg/dL (ref 70–99)
Glucose-Capillary: 133 mg/dL — ABNORMAL HIGH (ref 70–99)
Glucose-Capillary: 115 mg/dL — ABNORMAL HIGH (ref 70–99)
Glucose-Capillary: 123 mg/dL — ABNORMAL HIGH (ref 70–99)

## 2018-09-12 LAB — PARATHYROID HORMONE, INTACT (NO CA): PTH: 62 pg/mL (ref 15–65)

## 2018-09-12 LAB — C4 COMPLEMENT: Complement C4, Body Fluid: 35 mg/dL (ref 14–44)

## 2018-09-12 LAB — C3 COMPLEMENT: C3 Complement: 117 mg/dL (ref 82–167)

## 2018-09-12 LAB — ANA W/REFLEX IF POSITIVE: Anti Nuclear Antibody (ANA): NEGATIVE

## 2018-09-12 MED ORDER — SODIUM CHLORIDE 0.9 % IV SOLN
INTRAVENOUS | Status: DC
  Administered 2018-09-12: 14:00:00 via INTRAVENOUS

## 2018-09-12 MED ORDER — SODIUM ZIRCONIUM CYCLOSILICATE 10 G PO PACK
10.0000 g | PACK | Freq: Every day | ORAL | Status: DC
  Administered 2018-09-13 – 2018-09-14 (×2): 10 g via ORAL
  Filled 2018-09-12 (×2): qty 1

## 2018-09-12 MED ORDER — IPRATROPIUM-ALBUTEROL 0.5-2.5 (3) MG/3ML IN SOLN
3.0000 mL | RESPIRATORY_TRACT | Status: DC | PRN
  Administered 2018-09-12 – 2018-09-14 (×4): 3 mL via RESPIRATORY_TRACT
  Filled 2018-09-12 (×4): qty 3

## 2018-09-12 NOTE — Progress Notes (Signed)
Patient ID: Javier Wong, male   DOB: 27-Dec-1932, 85 y.o.   MRN: [id number] Overall the patient states that he feels better.  He does not complain of any shortness of breath or pain.  He states that he feels a little tired overall but is much improved since admission.  His lungs are clear anteriorly.  His heart is regular.  There is a paced rhythm.  His urine output is adequate.  His laboratory studies show a declining creatinine.  His potassium is now normal.  I did discuss his care today with Dr. Ubaldo Glassing.  It appears that the patient is clinically improving and there is no need for urgent pericardial window.  Please let us know if we can be of any further assistance.

## 2018-09-12 NOTE — Progress Notes (Signed)
Notify Dr. Jannifer Franklin about patient wheezy tonight, asked if he can have PRN breathing treatment, order give for Duoneb. RN will continue to monitor.

## 2018-09-12 NOTE — Evaluation (Signed)
Physical Therapy Evaluation Patient Details Name: Javier Wong MRN: [id number] DOB: [birth year] Today's Date: 09/12/2018   History of Present Illness  Javier Wong is an 64yoM who comes to [facility] after worsening SOB, weakness. Pt clinically dehydrated and in ARF.  Clinical Impression  Pt admitted with above diagnosis. Pt currently with functional limitations due to the deficits listed below (see "PT Problem List"). Upon entry, pt in bed, awake and agreeable to participate. The pt is alert and oriented x4, pleasant, conversational, and generally a good historian. Pt is tired, and has not been out of bed for several days. ModA to come to EOB and minA+maximal pt effort to come to standing. Establishing balance requires ~60-90 seconds. Pt is too weak to attempt AMB with SPC as per his baseline, but is agreeable to attempt AMB with RW, of which he tolerates 51ft prior to need to sit and recover. Functional mobility assessment demonstrates increased effort/time requirements, poor tolerance, and need for physical assistance, whereas the patient performed these at a higher level of independence PTA. Pt has limited support at home, lives alone, and has several steps to enter home. Pt will benefit from skilled PT intervention to increase independence and safety with basic mobility in preparation for discharge to the venue listed below.       Follow Up Recommendations SNF;Supervision for mobility/OOB;Supervision - Intermittent    Equipment Recommendations  None recommended by PT    Recommendations for Other Services       Precautions / Restrictions Precautions Precautions: Fall Restrictions Weight Bearing Restrictions: No      Mobility  Bed Mobility Overal bed mobility: Needs Assistance Bed Mobility: Supine to Sit;Sit to Supine     Supine to sit: Mod assist Sit to supine: Mod assist      Transfers Overall transfer level: Needs assistance Equipment used: Rolling walker (2  wheeled) Transfers: Sit to/from Stand Sit to Stand: Min assist         General transfer comment: Rocking strategy with several attempts prior to assistance, max effort required to rise to standing.  Ambulation/Gait Ambulation/Gait assistance: Min guard Gait Distance (Feet): 24 Feet(defers additional distance 2/2 fatigue) Assistive device: Rolling walker (2 wheeled)(reports too weak to attempt AMB with SPC as per baseline) Gait Pattern/deviations: Step-to pattern(3-point gait)     General Gait Details: very weak appearing and limited but steady overall.  Stairs            Wheelchair Mobility    Modified Rankin (Stroke Patients Only)       Balance Overall balance assessment: Needs assistance Sitting-balance support: No upper extremity supported;Feet supported Sitting balance-Leahy Scale: Good   Postural control: Posterior lean Standing balance support: During functional activity Standing balance-Leahy Scale: Fair                               Pertinent Vitals/Pain Pain Assessment: No/denies pain    Home Living Family/patient expects to be discharged to:: Private residence Living Arrangements: Alone Available Help at Discharge: Neighbor Type of Home: House Home Access: Stairs to enter Entrance Stairs-Rails: Can reach both Entrance Stairs-Number of Steps: 6 at front, 4 at back Home Layout: One level Home Equipment: Cane - single point Additional Comments: has fallen getting into the tub    Prior Function Level of Independence: Independent with assistive device(s)         Comments: drives grocery shops; 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 when 'turning around too fast'  Hand Dominance        Extremity/Trunk Assessment   Upper Extremity Assessment Upper Extremity Assessment: Generalized weakness    Lower Extremity Assessment Lower Extremity Assessment: Generalized weakness    Cervical / Trunk Assessment Cervical / Trunk Assessment: Normal   Communication   Communication: No difficulties  Cognition Arousal/Alertness: Awake/alert Behavior During Therapy: WFL for tasks assessed/performed Overall Cognitive Status: Within Functional Limits for tasks assessed                                        General Comments      Exercises     Assessment/Plan    PT Assessment Patient needs continued PT services  PT Problem List Decreased strength;Decreased activity tolerance;Decreased balance;Decreased mobility;Decreased knowledge of use of DME       PT Treatment Interventions Therapeutic exercise;DME instruction;Gait training;Stair training;Functional mobility training;Therapeutic activities;Patient/family education    PT Goals (Current goals can be found in the Care Plan section)  Acute Rehab PT Goals Patient Stated Goal: Regain strength and become "active" again PT Goal Formulation: With patient Time For Goal Achievement: 10/02/18 Potential to Achieve Goals: Fair    Frequency Min 2X/week   Barriers to discharge Decreased caregiver support      Co-evaluation               AM-PAC PT "6 Clicks" Mobility  Outcome Measure Help needed turning from your back to your side while in a flat bed without using bedrails?: A Lot Help needed moving from lying on your back to sitting on the side of a flat bed without using bedrails?: A Lot Help needed moving to and from a bed to a chair (including a wheelchair)?: A Little Help needed standing up from a chair using your arms (e.g., wheelchair or bedside chair)?: A Little Help needed to walk in hospital room?: A Little Help needed climbing 3-5 steps with a railing? : A Lot 6 Click Score: 15    End of Session Equipment Utilized During Treatment: Gait belt Activity Tolerance: Patient limited by fatigue Patient left: in bed;with call bell/phone within reach;with bed alarm set Nurse Communication: Mobility status PT Visit Diagnosis: Unsteadiness on feet  (R26.81);Repeated falls (R29.6);Difficulty in walking, not elsewhere classified (R26.2);Other abnormalities of gait and mobility (R26.89)    Time: 5638-9373 PT Time Calculation (min) (ACUTE ONLY): 18 min   Charges:   PT Evaluation $PT Eval Moderate Complexity: 1 Mod          6:02 PM, 09/12/18 Etta Grandchild, PT, DPT Physical Therapist - [facility]  [phone] ([place])    , C 09/12/2018, 5:59 PM

## 2018-09-12 NOTE — Consult Note (Signed)
Pulmonary Medicine          Date: 09/12/2018,   MRN# [id number] Javier Wong 02/03/1933     AdmissionWeight: 108.9 kg                 CurrentWeight: 112.4 kg      CHIEF COMPLAINT:   Subcarinal lung mass   HISTORY OF PRESENT ILLNESS   This is a pleasant 85 year old male with a history of essential hypertension, diabetes, chronic pericardial and pleural effusions, history of acute kidney injury in the past and on admission this time, gout, osteoarthritis, dyslipidemia came in for progressive fatigue and poor appetite over the last 1 month.  Additionally patient did have complaints of shortness of breath and nausea, on admission he had mild desaturation with SPO2 at 90 to 92%, had been tested for novel coronavirus which was negative.  Pulmonary consultation was placed by Dr. Vianne Bulls for abnormal CT chest showing lung mass suspicious for underlying malignancy.  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  He was evaluated by cardiothoracic surgery for this and they recommended to not pursue surgical intervention at this time.  Patient has never smoked has no history of asthma and has never drank alcohol.  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  His father also did something similar in shipyards and had passed away from complications of asbestosis.  He states he had never worn a respirator facemask during 30 years of working with asbestos.   PAST MEDICAL HISTORY   No past medical history on file.   SURGICAL HISTORY   Patient denies previous surgery   FAMILY HISTORY   No family history on file.   SOCIAL HISTORY     Social History   Tobacco Use   Smoking status: Never Smoker  Substance Use Topics   Alcohol use: Never    Frequency: Never   Drug use: Never     MEDICATIONS    Home Medication:    Current Medication:  Current Facility-Administered Medications:    acetaminophen (TYLENOL) tablet 650 mg, 650 mg, Oral, Q6H PRN **OR** acetaminophen (TYLENOL) suppository 650 mg, 650 mg, Rectal, Q6H PRN, Epifanio Lesches, MD   bisacodyl (DULCOLAX) EC tablet 5 mg, 5 mg, Oral, Daily PRN, Epifanio Lesches, MD   docusate sodium (COLACE) capsule 100 mg, 100 mg, Oral, BID, Epifanio Lesches, MD   feeding supplement (NEPRO CARB STEADY) liquid 237 mL, 237 mL, Oral, BID BM, Mayo, Pete Pelt, MD   heparin injection 5,000 Units, 5,000 Units, Subcutaneous, Q8H, Epifanio Lesches, MD, 5,000 Units at 09/12/18 8341   HYDROcodone-acetaminophen (NORCO/VICODIN) 5-325 MG per tablet 1 tablet, 1 tablet, Oral, Q4H PRN, Epifanio Lesches, MD   insulin aspart (novoLOG) injection 0-9 Units, 0-9 Units, Subcutaneous, TID WC, Epifanio Lesches, MD, 1 Units at 09/11/18 1717   multivitamin with minerals tablet 1 tablet, 1 tablet, Oral, Daily, Mayo, Pete Pelt, MD, 1 tablet at 09/12/18 0929   ondansetron (ZOFRAN) tablet 4 mg, 4 mg, Oral, Q6H PRN **OR** ondansetron (ZOFRAN) injection 4 mg, 4 mg, Intravenous, Q6H PRN, Vianne Bulls, [place], MD   sodium chloride 0.225 % with sodium bicarbonate 100 mEq infusion, ,  Intravenous, Continuous, Epifanio Lesches, MD, Last Rate: 75 mL/hr at 09/12/18 1050   sodium zirconium cyclosilicate (LOKELMA) packet 10 g, 10 g, Oral, TID, Epifanio Lesches, MD, 10 g at 09/12/18 0930   traZODone (DESYREL) tablet 25 mg, 25 mg, Oral, QHS PRN, Epifanio Lesches, MD, 25 mg at 09/11/18 2114    ALLERGIES   Patient has no known allergies.     REVIEW OF SYSTEMS    Review of Systems:  Gen:  Denies  fever, sweats, chills weigh loss  HEENT: Denies blurred vision,  double vision, ear pain, eye pain, hearing loss, nose bleeds, sore throat Cardiac:  No dizziness, chest pain or heaviness, chest tightness,edema Resp:   Denies cough or sputum porduction, shortness of breath,wheezing, hemoptysis,  Gi: Denies swallowing difficulty, stomach pain, nausea or vomiting, diarrhea, constipation, bowel incontinence Gu:  Denies bladder incontinence, burning urine Ext:   Denies Joint pain, stiffness or swelling Skin: Denies  skin rash, easy bruising or bleeding or hives Endoc:  Denies polyuria, polydipsia , polyphagia or weight change Psych:   Denies depression, insomnia or hallucinations   Other:  All other systems negative   VS: BP (!) 111/46 (BP Location: Left Arm)    Pulse (!) 51    Temp 97.8 F (36.6 C)    Resp 19    Ht 5\' 11"  (1.803 m)    Wt 112.4 kg    SpO2 96%    BMI 34.55 kg/m      PHYSICAL EXAM    GENERAL:NAD, no fevers, chills, no weakness no fatigue HEAD: Normocephalic, atraumatic.  EYES: Pupils equal, round, reactive to light. Extraocular muscles intact. No scleral icterus.  MOUTH: Moist mucosal membrane. Dentition intact. No abscess noted.  EAR, NOSE, THROAT: Clear without exudates. No external lesions.  NECK: Supple. No thyromegaly. No nodules. No JVD.  PULMONARY: Bibasilar crackles without wheezing or rhonchorous breath sounds CARDIOVASCULAR: S1 and S2. Regular rate and rhythm. No murmurs, rubs, or gallops. No edema. Pedal pulses 2+ bilaterally.  GASTROINTESTINAL: Soft, nontender, nondistended. No masses. Positive bowel sounds. No hepatosplenomegaly.  MUSCULOSKELETAL: No swelling, clubbing, or edema. Range of motion full in all extremities.  NEUROLOGIC: Cranial nerves II through XII are intact. No gross focal neurological deficits. Sensation intact. Reflexes intact.  SKIN: No ulceration, lesions, rashes, or cyanosis. Skin warm and dry. Turgor intact.  PSYCHIATRIC: Mood, affect within normal limits. The patient is awake, alert and oriented x 3.  Insight, judgment intact.       IMAGING    Ct Chest Wo Contrast  Result Date: 09/11/2018 CLINICAL DATA:  Increasing weakness, fatigue and nausea. EXAM: CT CHEST WITHOUT CONTRAST TECHNIQUE: Multidetector CT imaging of the chest was performed following the standard protocol without IV contrast. COMPARISON:  Chest x-ray 09/10/2018 FINDINGS: Cardiovascular: The heart is normal in size for the patient's age. There is a moderate to large pericardial effusion. This appears to be simple fluid. The pacer wires are in good position without complicating features. There is mild tortuosity, ectasia and moderate atherosclerotic calcifications involving the thoracic aorta. Three-vessel coronary artery calcifications are noted. Mediastinum/Nodes: There is a very large subcarinal mass measuring at least 7.7 x 4.9 x 5.1 cm. It appears to be contiguous with a right lower lobe soft tissue mass in the as ago esophageal recess. This also continues down to the diaphragmatic crus and there is adjacent probable nodal disease just above the celiac axis. A few other small scattered upper mediastinal lymph nodes. I do not see any definite direct involvement of  the esophagus. There is some high attenuation material the esophagus which could be debris or pills. Lungs/Pleura: Suspect right lower lobe mass in the as ago esophageal recess contiguous with the large mediastinal mass or adenopathy. Moderate bilateral pleural effusions along with fluid in both major fissures. There is an ill-defined 16 mm lesion in the right middle lobe which is indeterminate. Moderate vascular congestion and probable mild perihilar pulmonary edema. Upper Abdomen: Advanced cirrhotic changes involving the liver with a very irregular liver contour, dilated hepatic fissures and increased caudate to right lobe ratio. No obvious hepatic lesions without contrast. No splenomegaly. 2 cm nodal lesion noted just above the celiac axis and adjacent to the caudate lobe of  the liver. Musculoskeletal: Indeterminate subcutaneous lesion involving the posterior lower thorax. Smaller adjacent lesion is also indeterminate. These could be benign complex sebaceous cysts. Mild symmetric bilateral gynecomastia. Left-sided permanent pacemaker without complicating features. Advanced degenerative changes involving the spine but no worrisome bone lesions. IMPRESSION: 1. 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 PET-CT or biopsy may be helpful for further evaluation. 2. Moderate-sized bilateral pleural effusions. 3. Moderate to large pericardial effusion. 4. Indeterminate 16 mm nodule in the right middle lobe, possibly a metastatic focus. 5. Advanced cirrhotic changes involving the liver without definite hepatic lesions. Aortic Atherosclerosis (ICD10-I70.0). Electronically Signed   By: Marijo Sanes M.D.   On: 09/11/2018 13:51   US Renal  Result Date: 09/11/2018 CLINICAL DATA:  Acute kidney injury EXAM: RENAL / URINARY TRACT ULTRASOUND COMPLETE COMPARISON:  None. FINDINGS: Right Kidney: Renal measurements: 11.0 x 5.4 x 5.1 cm = volume: 159 mL. Slightly increased echotexture diffusely. Cortical thinning. No mass or hydronephrosis Left Kidney: Renal measurements: 11.2 x 5.9 x 5.7 cm = volume: 200 mL. Slightly increased echotexture diffusely with cortical thinning. No mass or hydronephrosis. Bladder: Appears normal for degree of bladder distention. Small amount of ascites seen within the abdomen. IMPRESSION: Increased echotexture and cortical thinning compatible with chronic medical renal disease. No acute findings. No hydronephrosis. Small amount of ascites incidentally noted. Electronically Signed   By: Rolm Baptise M.D.   On: 09/11/2018 10:52   Dg Chest Portable 1 View  Result Date: 09/10/2018 CLINICAL DATA:  Shortness of breath. EXAM: PORTABLE CHEST 1 VIEW  COMPARISON:  Radiograph of April 06, 2010. FINDINGS: Stable cardiomegaly. Left-sided pacemaker is unchanged in position. No pneumothorax is noted. No significant pleural effusion is noted. Minimal bibasilar subsegmental atelectasis is noted. Degenerative changes are seen involving both glenohumeral joints. IMPRESSION: Minimal bibasilar subsegmental atelectasis. Electronically Signed   By: Marijo Conception M.D.   On: 09/10/2018 14:47        ASSESSMENT/PLAN     Right lower lobe lung mass with hilar and mediastinal lyphadenopathy    - subcarinal mass extension vs significantly enlarged station 7 node    - RLL peripheral sattelite lesion noted   - lifelong nonsmoker but worked > 30 years direct contact with asbestos retired 20 years ago  - likely primary lung cancer vs lymphoma  - less likely infectious   - will need outpatient workup with full PFT and pulmonary preoperative evaluation as well as cardiac clearance for anesthesia  - plan for PET scan thigh to skull with additional evaluation via ENB/EBUS to obtain tissue sampling of peripheral lesion, main mass, and mediastinal staging.  Thank you for allowing me to participate in the care of this patient.   Patient/Family are satisfied with care plan and all questions have been answered.  This document was prepared using Dragon voice recognition software and may include unintentional dictation errors.     Ottie Glazier, M.D.  Division of [place]

## 2018-09-12 NOTE — Progress Notes (Signed)
Patient Name: Javier Wong Date of Encounter: 09/12/2018  Hospital Problem List     Active Problems:   AKI (acute kidney injury) [facility])    Patient Profile     85 year old male in no acute distress.  Mild shortness of breath and fatigue.  Anxious to go home.  [birth year].  Subjective   Feels slightly less short of breath although still has nausea and difficulty eating.  Inpatient Medications    . docusate sodium  100 mg Oral BID  . feeding supplement (NEPRO CARB STEADY)  237 mL Oral BID BM  . heparin  5,000 Units Subcutaneous Q8H  . insulin aspart  0-9 Units Subcutaneous TID WC  . multivitamin with minerals  1 tablet Oral Daily  . sodium zirconium cyclosilicate  10 g Oral TID    Vital Signs    Vitals:   09/11/18 1710 09/11/18 1951 09/12/18 0420 09/12/18 0719  BP: (!) 113/50 (!) 106/59 (!) 141/55 (!) 111/46  Pulse: (!) 50 (!) 51 (!) 52 (!) 51  Resp: 19 20 18 19   Temp:  98 F (36.7 C) 97.6 F (36.4 C) 97.8 F (36.6 C)  TempSrc:  Oral Oral   SpO2: 96% 97% 93% 96%  Weight:   112.4 kg   Height:        Intake/Output Summary (Last 24 hours) at 09/12/2018 0853 Last data filed at 09/12/2018 0416 Gross per 24 hour  Intake 720 ml  Output 551 ml  Net 169 ml   Filed Weights   09/10/18 1853 09/11/18 0421 09/12/18 0420  Weight: 110.9 kg 111.3 kg 112.4 kg    Physical Exam    GEN: Well nourished, well developed, in no acute distress.  HEENT: normal.  Neck: Supple, no JVD, carotid bruits, or masses. Cardiac: RRR, no murmurs, rubs, or gallops. No clubbing, cyanosis, edema.  Radials/DP/PT 2+ and equal bilaterally.  Respiratory:  Respirations regular and unlabored, clear to auscultation bilaterally. GI: Soft, nontender, nondistended, BS + x 4. MS: no deformity or atrophy. Skin: warm and dry, no rash. Neuro:  Strength and sensation are intact. Psych: Normal affect.  Labs    CBC Recent Labs    09/10/18 1402 09/11/18 0608  WBC 14.3* 11.5*   NEUTROABS 12.9*  --   HGB 13.5 12.1*  HCT 41.4 36.7*  MCV 91.8 92.0  PLT 420* 662   Basic Metabolic Panel Recent Labs    09/10/18 1402  09/11/18 0608 09/12/18 0727  NA 130*  --  132* 131*  K 6.0*   < > 5.7* 4.7  CL 97*  --  98 95*  CO2 16*  --  20* 23  GLUCOSE 122*  --  116* 123*  BUN 70*  --  75* 72*  CREATININE 2.84*  --  2.73* 2.51*  CALCIUM 8.8*  --  8.4* 8.2*  MG 2.6*  --   --   --    < > = values in this interval not displayed.   Liver Function Tests Recent Labs    09/10/18 1402 09/12/18 0727  AST 19 18  ALT 14 15  ALKPHOS 71 64  BILITOT 1.0 0.8  PROT 7.7 6.2*  ALBUMIN 3.7 3.0*   No results for input(s): LIPASE, AMYLASE in the last 72 hours. Cardiac Enzymes No results for input(s): CKTOTAL, CKMB, CKMBINDEX, TROPONINI in the last 72 hours. BNP Recent Labs    09/10/18 1402  BNP 72.0   D-Dimer No results for input(s): DDIMER in the last  72 hours. Hemoglobin A1C Recent Labs    09/10/18 1952  HGBA1C 6.1*   Fasting Lipid Panel No results for input(s): CHOL, HDL, LDLCALC, TRIG, CHOLHDL, LDLDIRECT in the last 72 hours. Thyroid Function Tests Recent Labs    09/10/18 1952  TSH 1.404    Telemetry    Atrial flutter with ventricular pacing  ECG    Atrial flutter/atrial tachycardia with ventricular pacing  Radiology    Ct Chest Wo Contrast  Result Date: 09/11/2018 CLINICAL DATA:  Increasing weakness, fatigue and nausea. EXAM: CT CHEST WITHOUT CONTRAST TECHNIQUE: Multidetector CT imaging of the chest was performed following the standard protocol without IV contrast. COMPARISON:  Chest x-ray 09/10/2018 FINDINGS: Cardiovascular: The heart is normal in size for the patient's age. There is a moderate to large pericardial effusion. This appears to be simple fluid. The pacer wires are in good position without complicating features. There is mild tortuosity, ectasia and moderate atherosclerotic calcifications involving the thoracic aorta. Three-vessel  coronary artery calcifications are noted. Mediastinum/Nodes: There is a very large subcarinal mass measuring at least 7.7 x 4.9 x 5.1 cm. It appears to be contiguous with a right lower lobe soft tissue mass in the as ago esophageal recess. This also continues down to the diaphragmatic crus and there is adjacent probable nodal disease just above the celiac axis. A few other small scattered upper mediastinal lymph nodes. I do not see any definite direct involvement of the esophagus. There is some high attenuation material the esophagus which could be debris or pills. Lungs/Pleura: Suspect right lower lobe mass in the as ago esophageal recess contiguous with the large mediastinal mass or adenopathy. Moderate bilateral pleural effusions along with fluid in both major fissures. There is an ill-defined 16 mm lesion in the right middle lobe which is indeterminate. Moderate vascular congestion and probable mild perihilar pulmonary edema. Upper Abdomen: Advanced cirrhotic changes involving the liver with a very irregular liver contour, dilated hepatic fissures and increased caudate to right lobe ratio. No obvious hepatic lesions without contrast. No splenomegaly. 2 cm nodal lesion noted just above the celiac axis and adjacent to the caudate lobe of the liver. Musculoskeletal: Indeterminate subcutaneous lesion involving the posterior lower thorax. Smaller adjacent lesion is also indeterminate. These could be benign complex sebaceous cysts. Mild symmetric bilateral gynecomastia. Left-sided permanent pacemaker without complicating features. Advanced degenerative changes involving the spine but no worrisome bone lesions. IMPRESSION: 1. 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 PET-CT or biopsy may be helpful for further evaluation. 2. Moderate-sized bilateral pleural effusions. 3. Moderate to  large pericardial effusion. 4. Indeterminate 16 mm nodule in the right middle lobe, possibly a metastatic focus. 5. Advanced cirrhotic changes involving the liver without definite hepatic lesions. Aortic Atherosclerosis (ICD10-I70.0). Electronically Signed   By: Marijo Sanes M.D.   On: 09/11/2018 13:51   US Renal  Result Date: 09/11/2018 CLINICAL DATA:  Acute kidney injury EXAM: RENAL / URINARY TRACT ULTRASOUND COMPLETE COMPARISON:  None. FINDINGS: Right Kidney: Renal measurements: 11.0 x 5.4 x 5.1 cm = volume: 159 mL. Slightly increased echotexture diffusely. Cortical thinning. No mass or hydronephrosis Left Kidney: Renal measurements: 11.2 x 5.9 x 5.7 cm = volume: 200 mL. Slightly increased echotexture diffusely with cortical thinning. No mass or hydronephrosis. Bladder: Appears normal for degree of bladder distention. Small amount of ascites seen within the abdomen. IMPRESSION:  Increased echotexture and cortical thinning compatible with chronic medical renal disease. No acute findings. No hydronephrosis. Small amount of ascites incidentally noted. Electronically Signed   By: Rolm Baptise M.D.   On: 09/11/2018 10:52   Dg Chest Portable 1 View  Result Date: 09/10/2018 CLINICAL DATA:  Shortness of breath. EXAM: PORTABLE CHEST 1 VIEW COMPARISON:  Radiograph of April 06, 2010. FINDINGS: Stable cardiomegaly. Left-sided pacemaker is unchanged in position. No pneumothorax is noted. No significant pleural effusion is noted. Minimal bibasilar subsegmental atelectasis is noted. Degenerative changes are seen involving both glenohumeral joints. IMPRESSION: Minimal bibasilar subsegmental atelectasis. Electronically Signed   By: Marijo Conception M.D.   On: 09/10/2018 14:47    Assessment & Plan    Pericardial effusion-no tamponade physiology.  Have discussed with cardiothoracic surgery regarding pericardial window.  Appreciate Dr. Genevive Bi input.  We will plan to follow closely over the weekend and if he remains  hemodynamically stable we will proceed with a repeat limited echocardiogram to assess for any interval change in his pericardial effusion.  At that time decision regarding pericardial window versus continued medical management versus urgent pericardiocentesis will be discussed.  Atrial arrhythmia-appears to have atrial flutter.  Will defer chronic anticoagulation for present and consider after interventional decision is made  Renal insufficiency-creatinine has improved slightly since previous 1 creatinine of 2.51 down from 2.842 days ago.  We will continue to follow.  Appreciate nephrology input. Signed, Javier Docker Joline Encalada MD 09/12/2018, 8:53 AM  Pager: [phone]

## 2018-09-12 NOTE — Progress Notes (Signed)
[place] [facility] [place] NAME: Javier Wong    MR#:  [id number]  DATE OF BIRTH:  30-Jun-1932  SUBJECTIVE:   Says he is feeling better than when he came in.  No shortness of breath, not on oxygen.  Room air saturation 96%. REVIEW OF SYSTEMS:  Review of Systems  Constitutional: Positive for malaise/fatigue. Negative for chills and fever.  HENT: Negative for congestion and sore throat.   Eyes: Negative for blurred vision and double vision.  Respiratory: Positive for shortness of breath. Negative for cough.   Cardiovascular: Negative for chest pain and palpitations.  Gastrointestinal: Positive for nausea. Negative for abdominal pain and vomiting.  Genitourinary: Negative for dysuria and urgency.  Musculoskeletal: Negative for back pain and neck pain.  Neurological: Positive for weakness. Negative for dizziness, focal weakness and headaches.  Psychiatric/Behavioral: Negative for depression. The patient is not nervous/anxious.     DRUG ALLERGIES:  No Known Allergies VITALS:  Blood pressure (!) 111/46, pulse (!) 51, temperature 97.8 F (36.6 C), resp. rate 19, height 5\' 11"  (1.803 m), weight 112.4 kg, SpO2 96 %. PHYSICAL EXAMINATION:  Physical Exam  GENERAL:   Denies any complaints. HEENT: Head atraumatic, normoceph and accommodation. No scleral icterus. Extraocular muscles intact. Oropharynx and nasopharynx clear.  NECK:  Supple, no jugular venous distention. No thyroid enlargement. LUNGS: Lungs are clear to auscultation bilaterally. No wheezes, crackles, rhonchi. No use of accessory muscles of respiration.  CARDIOVASCULAR: Bradycardia resolved, heart rate 62 bpm. ABDOMEN: Soft, nontender, nondistended. Bowel sounds present.  EXTREMITIES: No pedal edema, cyanosis, or clubbing.  NEUROLOGIC: CN 2-12 intact, no focal deficits. +global weakness. Sensation intact throughout. Gait not checked.  PSYCHIATRIC: The patient is alert and oriented x 3.    SKIN: No obvious rash, lesion, or ulcer.  LABORATORY PANEL:  Male CBC Recent Labs  Lab 09/11/18 0608  WBC 11.5*  HGB 12.1*  HCT 36.7*  PLT 307   ------------------------------------------------------------------------------------------------------------------ Chemistries  Recent Labs  Lab 09/10/18 1402  09/12/18 0727  NA 130*   < > 131*  K 6.0*   < > 4.7  CL 97*   < > 95*  CO2 16*   < > 23  GLUCOSE 122*   < > 123*  BUN 70*   < > 72*  CREATININE 2.84*   < > 2.51*  CALCIUM 8.8*   < > 8.2*  MG 2.6*  --   --   AST 19  --  18  ALT 14  --  15  ALKPHOS 71  --  64  BILITOT 1.0  --  0.8   < > = values in this interval not displayed.   RADIOLOGY:  Ct Chest Wo Contrast  Result Date: 09/11/2018 CLINICAL DATA:  Increasing weakness, fatigue and nausea. EXAM: CT CHEST WITHOUT CONTRAST TECHNIQUE: Multidetector CT imaging of the chest was performed following the standard protocol without IV contrast. COMPARISON:  Chest x-ray 09/10/2018 FINDINGS: Cardiovascular: The heart is normal in size for the patient's age. There is a moderate to large pericardial effusion. This appears to be simple fluid. The pacer wires are in good position without complicating features. There is mild tortuosity, ectasia and moderate atherosclerotic calcifications involving the thoracic aorta. Three-vessel coronary artery calcifications are noted. Mediastinum/Nodes: There is a very large subcarinal mass measuring at least 7.7 x 4.9 x 5.1 cm. It appears to be contiguous with a right lower lobe soft tissue mass in the as ago esophageal recess. This also  continues down to the diaphragmatic crus and there is adjacent probable nodal disease just above the celiac axis. A few other small scattered upper mediastinal lymph nodes. I do not see any definite direct involvement of the esophagus. There is some high attenuation material the esophagus which could be debris or pills. Lungs/Pleura: Suspect right lower lobe mass in the as  ago esophageal recess contiguous with the large mediastinal mass or adenopathy. Moderate bilateral pleural effusions along with fluid in both major fissures. There is an ill-defined 16 mm lesion in the right middle lobe which is indeterminate. Moderate vascular congestion and probable mild perihilar pulmonary edema. Upper Abdomen: Advanced cirrhotic changes involving the liver with a very irregular liver contour, dilated hepatic fissures and increased caudate to right lobe ratio. No obvious hepatic lesions without contrast. No splenomegaly. 2 cm nodal lesion noted just above the celiac axis and adjacent to the caudate lobe of the liver. Musculoskeletal: Indeterminate subcutaneous lesion involving the posterior lower thorax. Smaller adjacent lesion is also indeterminate. These could be benign complex sebaceous cysts. Mild symmetric bilateral gynecomastia. Left-sided permanent pacemaker without complicating features. Advanced degenerative changes involving the spine but no worrisome bone lesions. IMPRESSION: 1. 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 PET-CT or biopsy may be helpful for further evaluation. 2. Moderate-sized bilateral pleural effusions. 3. Moderate to large pericardial effusion. 4. Indeterminate 16 mm nodule in the right middle lobe, possibly a metastatic focus. 5. Advanced cirrhotic changes involving the liver without definite hepatic lesions. Aortic Atherosclerosis (ICD10-I70.0). Electronically Signed   By: Marijo Sanes M.D.   On: 09/11/2018 13:51   US Renal  Result Date: 09/11/2018 CLINICAL DATA:  Acute kidney injury EXAM: RENAL / URINARY TRACT ULTRASOUND COMPLETE COMPARISON:  None. FINDINGS: Right Kidney: Renal measurements: 11.0 x 5.4 x 5.1 cm = volume: 159 mL. Slightly increased echotexture diffusely. Cortical thinning. No mass or hydronephrosis Left Kidney:  Renal measurements: 11.2 x 5.9 x 5.7 cm = volume: 200 mL. Slightly increased echotexture diffusely with cortical thinning. No mass or hydronephrosis. Bladder: Appears normal for degree of bladder distention. Small amount of ascites seen within the abdomen. IMPRESSION: Increased echotexture and cortical thinning compatible with chronic medical renal disease. No acute findings. No hydronephrosis. Small amount of ascites incidentally noted. Electronically Signed   By: Rolm Baptise M.D.   On: 09/11/2018 10:52   ASSESSMENT AND PLAN:   Moderate pericardial effusion- seen on echo.  No evidence of tamponade physiology. CT chest is showing right lung mass with bulky tumor 74.7 cm.  Also had moderate bilateral pleural effusion. -Cardiology and cardiothoracic surgery following -Holding off on pericardial window for now Spoke with Dr. Ubaldo Glassing today, repeat echo on Monday for further plan for pericardial window if needed unless patient decompensates patient made pericardiocentesis sooner.  Patient is not hypoxic and he says he is feeling better.  -Acute kidney injury- likely secondary to NSAID use and poor cardiac output. -Renal ultrasound showed medical renal disease, but no acute abnormalities -Nephrology consulted -Avoid nephrotoxic agents -Continue gentle IV fluids, renal function slightly better, it improved from 2.84-2.51.  Hyperkalemia- likely secondary to above.  Potassium improving. -Continue Lokelma 10 mg p.o. 3 times daily per nephrology recommendations, potassium decreased from 6-4.7.  Chronic atrial fibrillation with dual-chamber pacemaker in place -Cardiology following  Deconditioning- likely due to above -PT consult  Type 2 diabetes -Continue  SSI  Chronic diastolic congestive heart failure- patient does not appear volume overloaded. -Receiving gentle IVFs Lung mass, consult oncology, patient may need a biopsy of the lung, consult pulmonary also. All the records are reviewed and case  discussed with Care Management/Social Worker. Management plans discussed with the patient, family and they are in agreement.  CODE STATUS: Full Code  TOTAL TIME TAKING CARE OF THIS PATIENT: 35 minutes.   More than 50% of the time was spent in counseling/coordination of care: YES  POSSIBLE D/C IN 3-4 DAYS, DEPENDING ON CLINICAL CONDITION.   Epifanio Lesches M.D on 09/12/2018 at 9:32 AM  Between 7am to 6pm - Pager - [phone]  After 6pm go to [email or web address] - Proofreader  [employer] [facility]  Office  [phone]  CC: Primary care physician; Glendon Axe, MD  Note: This dictation was prepared with Dragon dictation along with smaller phrase technology. Any transcriptional errors that result from this process are unintentional.

## 2018-09-12 NOTE — Progress Notes (Signed)
[facility]  ROUNDING NOTE   Subjective:  Patient sitting up in bed today. Breathing comfortably. No urgent plan for pericardial window at this time.    Objective:  Vital signs in last 24 hours:  Temp:  [97.6 F (36.4 C)-98 F (36.7 C)] 97.8 F (36.6 C) (07/25 0719) Pulse Rate:  [50-52] 51 (07/25 0719) Resp:  [18-20] 19 (07/25 0719) BP: (106-141)/(46-59) 111/46 (07/25 0719) SpO2:  [93 %-97 %] 96 % (07/25 0719) Weight:  [112.4 kg] 112.4 kg (07/25 0420)  Weight change: 3.493 kg Filed Weights   09/10/18 1853 09/11/18 0421 09/12/18 0420  Weight: 110.9 kg 111.3 kg 112.4 kg    Intake/Output: I/O last 3 completed shifts: In: 1458.8 [P.O.:720; I.V.:738.8] Out: 551 [Urine:551]   Intake/Output this shift:  No intake/output data recorded.  Physical Exam: General: No acute distress  Head: Normocephalic, atraumatic. Moist oral mucosal membranes  Eyes: Anicteric  Neck: Supple, trachea midline  Lungs:  Clear to auscultation, normal effort  Heart: S1S2 no rubs  Abdomen:  Soft, nontender, bowel sounds present  Extremities: 2+ peripheral edema.  Neurologic: Awake, alert, following commands  Skin: No lesions       Basic Metabolic Panel: Recent Labs  Lab 09/10/18 1402 09/10/18 1952 09/11/18 0608 09/12/18 0727  NA 130*  --  132* 131*  K 6.0* 5.6* 5.7* 4.7  CL 97*  --  98 95*  CO2 16*  --  20* 23  GLUCOSE 122*  --  116* 123*  BUN 70*  --  75* 72*  CREATININE 2.84*  --  2.73* 2.51*  CALCIUM 8.8*  --  8.4* 8.2*  MG 2.6*  --   --   --     Liver Function Tests: Recent Labs  Lab 09/10/18 1402 09/12/18 0727  AST 19 18  ALT 14 15  ALKPHOS 71 64  BILITOT 1.0 0.8  PROT 7.7 6.2*  ALBUMIN 3.7 3.0*   No results for input(s): LIPASE, AMYLASE in the last 168 hours. No results for input(s): AMMONIA in the last 168 hours.  CBC: Recent Labs  Lab 09/10/18 1402 09/11/18 0608  WBC 14.3* 11.5*  NEUTROABS 12.9*  --   HGB 13.5 12.1*  HCT 41.4 36.7*  MCV  91.8 92.0  PLT 420* 307    Cardiac Enzymes: No results for input(s): CKTOTAL, CKMB, CKMBINDEX, TROPONINI in the last 168 hours.  BNP: Invalid input(s): POCBNP  CBG: Recent Labs  Lab 09/11/18 0733 09/11/18 1123 09/11/18 1708 09/11/18 2055 09/12/18 0720  GLUCAP 102* 132* 144* 116* 98    Microbiology: Results for orders placed or performed during the hospital encounter of 09/10/18  SARS Coronavirus 2 (CEPHEID - Performed in [place] hospital lab), Hosp Order     Status: None   Collection Time: 09/10/18  2:03 PM   Specimen: Nasopharyngeal Swab  Result Value Ref Range Status   SARS Coronavirus 2 NEGATIVE NEGATIVE Final    Comment: (NOTE) If result is NEGATIVE SARS-CoV-2 target nucleic acids are NOT DETECTED. The SARS-CoV-2 RNA is generally detectable in upper and lower  respiratory specimens during the acute phase of infection. The lowest  concentration of SARS-CoV-2 viral copies this assay can detect is 250  copies / mL. A negative result does not preclude SARS-CoV-2 infection  and should not be used as the sole basis for treatment or other  patient management decisions.  A negative result may occur with  improper specimen collection / handling, submission of specimen other  than nasopharyngeal swab, presence  of viral mutation(s) within the  areas targeted by this assay, and inadequate number of viral copies  (<250 copies / mL). A negative result must be combined with clinical  observations, patient history, and epidemiological information. If result is POSITIVE SARS-CoV-2 target nucleic acids are DETECTED. The SARS-CoV-2 RNA is generally detectable in upper and lower  respiratory specimens dur ing the acute phase of infection.  Positive  results are indicative of active infection with SARS-CoV-2.  Clinical  correlation with patient history and other diagnostic information is  necessary to determine patient infection status.  Positive results do  not rule out  bacterial infection or co-infection with other viruses. If result is PRESUMPTIVE POSTIVE SARS-CoV-2 nucleic acids MAY BE PRESENT.   A presumptive positive result was obtained on the submitted specimen  and confirmed on repeat testing.  While 2019 novel coronavirus  (SARS-CoV-2) nucleic acids may be present in the submitted sample  additional confirmatory testing may be necessary for epidemiological  and / or clinical management purposes  to differentiate between  SARS-CoV-2 and other Sarbecovirus currently known to infect humans.  If clinically indicated additional testing with an alternate test  methodology [phone]) is advised. The SARS-CoV-2 RNA is generally  detectable in upper and lower respiratory sp ecimens during the acute  phase of infection. The expected result is Negative. Fact Sheet for Patients:  StrictlyIdeas.no Fact Sheet for Healthcare Providers: BankingDealers.co.za This test is not yet approved or cleared by the [place] FDA and has been authorized for detection and/or diagnosis of SARS-CoV-2 by FDA under an Emergency Use Authorization (EUA).  This EUA will remain in effect (meaning this test can be used) for the duration of the COVID-19 declaration under Section 564(b)(1) of the Act, 21 U.S.C. section 360bbb-3(b)(1), unless the authorization is terminated or revoked sooner. Performed at [facility], [address] [place], [place] [address]     Coagulation Studies: Recent Labs    09/10/18 1402  LABPROT 16.1*  INR 1.3*    Urinalysis: No results for input(s): COLORURINE, LABSPEC, PHURINE, GLUCOSEU, HGBUR, BILIRUBINUR, KETONESUR, PROTEINUR, UROBILINOGEN, NITRITE, LEUKOCYTESUR in the last 72 hours.  Invalid input(s): APPERANCEUR    Imaging: Ct Chest Wo Contrast  Result Date: 09/11/2018 CLINICAL DATA:  Increasing weakness, fatigue and nausea. EXAM: CT CHEST WITHOUT CONTRAST TECHNIQUE:  Multidetector CT imaging of the chest was performed following the standard protocol without IV contrast. COMPARISON:  Chest x-ray 09/10/2018 FINDINGS: Cardiovascular: The heart is normal in size for the patient's age. There is a moderate to large pericardial effusion. This appears to be simple fluid. The pacer wires are in good position without complicating features. There is mild tortuosity, ectasia and moderate atherosclerotic calcifications involving the thoracic aorta. Three-vessel coronary artery calcifications are noted. Mediastinum/Nodes: There is a very large subcarinal mass measuring at least 7.7 x 4.9 x 5.1 cm. It appears to be contiguous with a right lower lobe soft tissue mass in the as ago esophageal recess. This also continues down to the diaphragmatic crus and there is adjacent probable nodal disease just above the celiac axis. A few other small scattered upper mediastinal lymph nodes. I do not see any definite direct involvement of the esophagus. There is some high attenuation material the esophagus which could be debris or pills. Lungs/Pleura: Suspect right lower lobe mass in the as ago esophageal recess contiguous with the large mediastinal mass or adenopathy. Moderate bilateral pleural effusions along with fluid in both major fissures. There is an ill-defined 16 mm lesion in  the right middle lobe which is indeterminate. Moderate vascular congestion and probable mild perihilar pulmonary edema. Upper Abdomen: Advanced cirrhotic changes involving the liver with a very irregular liver contour, dilated hepatic fissures and increased caudate to right lobe ratio. No obvious hepatic lesions without contrast. No splenomegaly. 2 cm nodal lesion noted just above the celiac axis and adjacent to the caudate lobe of the liver. Musculoskeletal: Indeterminate subcutaneous lesion involving the posterior lower thorax. Smaller adjacent lesion is also indeterminate. These could be benign complex sebaceous cysts. Mild  symmetric bilateral gynecomastia. Left-sided permanent pacemaker without complicating features. Advanced degenerative changes involving the spine but no worrisome bone lesions. IMPRESSION: 1. 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 PET-CT or biopsy may be helpful for further evaluation. 2. Moderate-sized bilateral pleural effusions. 3. Moderate to large pericardial effusion. 4. Indeterminate 16 mm nodule in the right middle lobe, possibly a metastatic focus. 5. Advanced cirrhotic changes involving the liver without definite hepatic lesions. Aortic Atherosclerosis (ICD10-I70.0). Electronically Signed   By: Marijo Sanes M.D.   On: 09/11/2018 13:51   US Renal  Result Date: 09/11/2018 CLINICAL DATA:  Acute kidney injury EXAM: RENAL / URINARY TRACT ULTRASOUND COMPLETE COMPARISON:  None. FINDINGS: Right Kidney: Renal measurements: 11.0 x 5.4 x 5.1 cm = volume: 159 mL. Slightly increased echotexture diffusely. Cortical thinning. No mass or hydronephrosis Left Kidney: Renal measurements: 11.2 x 5.9 x 5.7 cm = volume: 200 mL. Slightly increased echotexture diffusely with cortical thinning. No mass or hydronephrosis. Bladder: Appears normal for degree of bladder distention. Small amount of ascites seen within the abdomen. IMPRESSION: Increased echotexture and cortical thinning compatible with chronic medical renal disease. No acute findings. No hydronephrosis. Small amount of ascites incidentally noted. Electronically Signed   By: Rolm Baptise M.D.   On: 09/11/2018 10:52   Dg Chest Portable 1 View  Result Date: 09/10/2018 CLINICAL DATA:  Shortness of breath. EXAM: PORTABLE CHEST 1 VIEW COMPARISON:  Radiograph of April 06, 2010. FINDINGS: Stable cardiomegaly. Left-sided pacemaker is unchanged in position. No pneumothorax is noted. No significant pleural effusion is noted. Minimal  bibasilar subsegmental atelectasis is noted. Degenerative changes are seen involving both glenohumeral joints. IMPRESSION: Minimal bibasilar subsegmental atelectasis. Electronically Signed   By: Marijo Conception M.D.   On: 09/10/2018 14:47     Medications:   .  sodium bicarbonate infusion 1/4 NS 1000 mL 75 mL/hr at 09/12/18 1050   . docusate sodium  100 mg Oral BID  . feeding supplement (NEPRO CARB STEADY)  237 mL Oral BID BM  . heparin  5,000 Units Subcutaneous Q8H  . insulin aspart  0-9 Units Subcutaneous TID WC  . multivitamin with minerals  1 tablet Oral Daily  . sodium zirconium cyclosilicate  10 g Oral TID   acetaminophen **OR** acetaminophen, bisacodyl, HYDROcodone-acetaminophen, ondansetron **OR** ondansetron (ZOFRAN) IV, traZODone  Assessment/ Plan:  85 y.o. male  with a PMHx of hypertension, hyperlipidemia, chronic kidney disease stage III baseline creatinine 1.5, EGFR 45, history of complete heart block status post dual chamber pacemaker placement, who was admitted to [facility] on 09/10/2018 for evaluation of weakness, weakness, and poor p.o. intake.  1.  Acute renal failure, question secondary to altered cardiorenal hemodynamics and use of aleve.  2.  Chronic kidney disease stage III baseline creatinine 1.5, EGFR 45. 3.  Pericardial effusion, moderate in size, cardiology  following, no acute tamponade physiology. 4.  Anemia of chronic kidney disease. 5.  Hyperkalemia. 6.  Metabolic acidosis.  Plan: The patient's acute renal failure has slightly improved.  BUN currently 72 with a creatinine of 2.5.  Patient currently on bicarbonate drip.  Discontinue this in favor of 0.9 normal saline at 50 cc/h given slightly worsened hyponatremia.  Inasmuch as underlying pericardial effusion no immediate plans for pericardial window at this time.  Continue to monitor renal parameters.  We will also decrease sodium zirconium to 10 g daily and consider stopping it tomorrow.  LOS: 2 Darlette Dubow 7/25/202012:06 PM

## 2018-09-13 ENCOUNTER — Inpatient Hospital Stay: Payer: Medicare Other

## 2018-09-13 ENCOUNTER — Encounter: Payer: Self-pay | Admitting: Internal Medicine

## 2018-09-13 DIAGNOSIS — R918 Other nonspecific abnormal finding of lung field: Secondary | ICD-10-CM

## 2018-09-13 DIAGNOSIS — N189 Chronic kidney disease, unspecified: Secondary | ICD-10-CM

## 2018-09-13 LAB — GLUCOSE, CAPILLARY
Glucose-Capillary: 114 mg/dL — ABNORMAL HIGH (ref 70–99)
Glucose-Capillary: 174 mg/dL — ABNORMAL HIGH (ref 70–99)
Glucose-Capillary: 128 mg/dL — ABNORMAL HIGH (ref 70–99)
Glucose-Capillary: 144 mg/dL — ABNORMAL HIGH (ref 70–99)
Glucose-Capillary: 172 mg/dL — ABNORMAL HIGH (ref 70–99)

## 2018-09-13 LAB — BASIC METABOLIC PANEL
Anion gap: 12 (ref 5–15)
BUN: 61 mg/dL — ABNORMAL HIGH (ref 8–23)
CO2: 25 mmol/L (ref 22–32)
Calcium: 8.4 mg/dL — ABNORMAL LOW (ref 8.9–10.3)
Chloride: 97 mmol/L — ABNORMAL LOW (ref 98–111)
Creatinine, Ser: 1.99 mg/dL — ABNORMAL HIGH (ref 0.61–1.24)
GFR calc Af Amer: 35 mL/min — ABNORMAL LOW (ref >60)
GFR calc non Af Amer: 30 mL/min — ABNORMAL LOW (ref >60)
Glucose, Bld: 166 mg/dL — ABNORMAL HIGH (ref 70–99)
Potassium: 3.9 mmol/L (ref 3.5–5.1)
Sodium: 134 mmol/L — ABNORMAL LOW (ref 135–145)

## 2018-09-13 LAB — URINALYSIS, COMPLETE (UACMP) WITH MICROSCOPIC
Bacteria, UA: NONE SEEN
Bilirubin Urine: NEGATIVE
Glucose, UA: NEGATIVE mg/dL
Hgb urine dipstick: NEGATIVE
Ketones, ur: 5 mg/dL — AB
Leukocytes,Ua: NEGATIVE
Nitrite: NEGATIVE
Protein, ur: NEGATIVE mg/dL
Specific Gravity, Urine: 1.016 (ref 1.005–1.030)
pH: 5 (ref 5.0–8.0)

## 2018-09-13 LAB — PROTIME-INR
INR: 1.3 — ABNORMAL HIGH (ref 0.8–1.2)
Prothrombin Time: 15.9 seconds — ABNORMAL HIGH (ref 11.4–15.2)

## 2018-09-13 LAB — APTT: aPTT: 30 seconds (ref 24–36)

## 2018-09-13 LAB — LACTATE DEHYDROGENASE: LDH: 322 U/L — ABNORMAL HIGH (ref 98–192)

## 2018-09-13 MED ORDER — GUAIFENESIN-DM 100-10 MG/5ML PO SYRP
5.0000 mL | ORAL_SOLUTION | ORAL | Status: DC | PRN
  Administered 2018-09-13: 5 mL via ORAL
  Filled 2018-09-13: qty 5

## 2018-09-13 MED ORDER — SODIUM CHLORIDE 0.9% FLUSH
10.0000 mL | Freq: Two times a day (BID) | INTRAVENOUS | Status: DC
  Administered 2018-09-13 – 2018-09-19 (×11): 10 mL via INTRAVENOUS

## 2018-09-13 NOTE — Progress Notes (Signed)
Patient was wheezing at the beginning of the shift. Administered duoneb, breathing has improved. Patient was able to rest throughout the remainder of the shift.

## 2018-09-13 NOTE — Consult Note (Signed)
[facility] CONSULT NOTE  Patient Care Team: Glendon Axe, MD as PCP - General (Internal Medicine)  CHIEF COMPLAINTS/PURPOSE OF CONSULTATION: Right lobe lung mass/mediastinal adenopathy  HISTORY OF PRESENTING ILLNESS:  Javier Wong 85 y.o.  male with prior history of hypertension diabetes and history of pacemaker for complete heart block is currently admitted hospital for worsening shortness of breath/worsening cough.  Further work-up in the hospital-including CT scan showed 45 cm right lower lobe lung mass; along with bulky mediastinal ~7 cm.  Patient also noted to have pericardial effusion-however a 2D echo is not consistent with cardiac tamponade.  Patient also noted to have acute renal failure with a creatinine-2.8/baseline 1.2; hyperkalemia.  Patient has been evaluated by cardiology/thoracic surgery/pulmonary/nephrology.  Oncology has been consulted for further evaluation/biopsy planning.  Patient currently denies pain.  No nausea vomiting.  No headaches.   Review of Systems  Constitutional: Positive for weight loss. Negative for chills, diaphoresis, fever and malaise/fatigue.  HENT: Negative for nosebleeds and sore throat.   Eyes: Negative for double vision.  Respiratory: Positive for cough and shortness of breath. Negative for hemoptysis, sputum production and wheezing.   Cardiovascular: Negative for chest pain, palpitations, orthopnea and leg swelling.  Gastrointestinal: Negative for abdominal pain, blood in stool, constipation, diarrhea, heartburn, melena, nausea and vomiting.  Genitourinary: Negative for dysuria, frequency and urgency.  Musculoskeletal: Positive for back pain and joint pain.  Skin: Negative.  Negative for itching and rash.  Neurological: Negative for dizziness, tingling, focal weakness, weakness and headaches.  Endo/Heme/Allergies: Does not bruise/bleed easily.  Psychiatric/Behavioral: Negative for depression. The patient is not  nervous/anxious and does not have insomnia.      MEDICAL HISTORY:  Past Medical History:  Diagnosis Date  . Chronic kidney disease   . Diabetes type 2, controlled ([place])   . Hypertension     SURGICAL HISTORY: Past Surgical History:  Procedure Laterality Date  . PACEMAKER IMPLANT      SOCIAL HISTORY: Social History   Socioeconomic History  . Marital status: Married    Spouse name: Not on file  . Number of children: Not on file  . Years of education: Not on file  . Highest education level: Not on file  Occupational History  . Not on file  Social Needs  . Financial resource strain: Not on file  . Food insecurity    Worry: Not on file    Inability: Not on file  . Transportation needs    Medical: Not on file    Non-medical: Not on file  Tobacco Use  . Smoking status: Never Smoker  Substance and Sexual Activity  . Alcohol use: Never    Frequency: Never  . Drug use: Never  . Sexual activity: Not Currently  Lifestyle  . Physical activity    Days per week: Not on file    Minutes per session: Not on file  . Stress: Not on file  Relationships  . Social Herbalist on phone: Not on file    Gets together: Not on file    Attends religious service: Not on file    Active member of club or organization: Not on file    Attends meetings of clubs or organizations: Not on file    Relationship status: Not on file  . Intimate partner violence    Fear of current or ex partner: Not on file    Emotionally abused: Not on file    Physically abused: Not on file  Forced sexual activity: Not on file  Other Topics Concern  . Not on file  Social History Narrative   Patient is alone.  He is fairly active for his age.  Denies any prior history of smoking.  No alcohol.  He admits to exposure to asbestos.     FAMILY HISTORY: Family History  Problem Relation Age of Onset  . Mesothelioma Father     ALLERGIES:  has No Known Allergies.  MEDICATIONS:  Current  Facility-Administered Medications  Medication Dose Route Frequency Provider Last Rate Last Dose  . acetaminophen (TYLENOL) tablet 650 mg  650 mg Oral Q6H PRN Epifanio Lesches, MD       Or  . acetaminophen (TYLENOL) suppository 650 mg  650 mg Rectal Q6H PRN Epifanio Lesches, MD      . bisacodyl (DULCOLAX) EC tablet 5 mg  5 mg Oral Daily PRN Epifanio Lesches, MD      . docusate sodium (COLACE) capsule 100 mg  100 mg Oral BID Epifanio Lesches, MD      . feeding supplement (NEPRO CARB STEADY) liquid 237 mL  237 mL Oral BID BM Mayo, Pete Pelt, MD      . guaiFENesin-dextromethorphan (ROBITUSSIN DM) 100-10 MG/5ML syrup 5 mL  5 mL Oral Q4H PRN Epifanio Lesches, MD   5 mL at 09/13/18 1802  . heparin injection 5,000 Units  5,000 Units Subcutaneous Q8H Epifanio Lesches, MD   5,000 Units at 09/13/18 1320  . HYDROcodone-acetaminophen (NORCO/VICODIN) 5-325 MG per tablet 1 tablet  1 tablet Oral Q4H PRN Epifanio Lesches, MD      . insulin aspart (novoLOG) injection 0-9 Units  0-9 Units Subcutaneous TID WC Epifanio Lesches, MD   1 Units at 09/13/18 1748  . ipratropium-albuterol (DUONEB) 0.5-2.5 (3) MG/3ML nebulizer solution 3 mL  3 mL Nebulization Q4H PRN Lance Coon, MD   3 mL at 09/13/18 0919  . multivitamin with minerals tablet 1 tablet  1 tablet Oral Daily Mayo, Pete Pelt, MD   1 tablet at 09/13/18 [phone]  . ondansetron (ZOFRAN) tablet 4 mg  4 mg Oral Q6H PRN Epifanio Lesches, MD       Or  . ondansetron (ZOFRAN) injection 4 mg  4 mg Intravenous Q6H PRN Epifanio Lesches, MD      . sodium zirconium cyclosilicate (LOKELMA) packet 10 g  10 g Oral Daily Lateef, Munsoor, MD   10 g at 09/13/18 0907  . traZODone (DESYREL) tablet 25 mg  25 mg Oral QHS PRN Epifanio Lesches, MD   25 mg at 09/12/18 2055      .  PHYSICAL EXAMINATION:  Vitals:   09/13/18 0814 09/13/18 1659  BP: 138/90 (!) 158/76  Pulse: 63 96  Resp: 18 19  Temp: (!) 97.5 F (36.4 C) 98.1 F (36.7 C)   SpO2: 97% 100%   Filed Weights   09/11/18 0421 09/12/18 0420 09/13/18 0320  Weight: 245 lb 4.8 oz (111.3 kg) 247 lb 11.2 oz (112.4 kg) 255 lb 9.6 oz (115.9 kg)    Physical Exam  Constitutional: He is oriented to person, place, and time and well-developed, well-nourished, and in no distress.  Obese male patient.  Resting in the bed.  HENT:  Head: Normocephalic and atraumatic.  Mouth/Throat: Oropharynx is clear and moist. No oropharyngeal exudate.  Eyes: Pupils are equal, round, and reactive to light.  Neck: Normal range of motion. Neck supple.  Cardiovascular: Normal rate and regular rhythm.  Pulmonary/Chest: No respiratory distress. He has no wheezes.  Decreased breath  sounds bilaterally right more than left.  Abdominal: Soft. Bowel sounds are normal. He exhibits no distension and no mass. There is no abdominal tenderness. There is no rebound and no guarding.  Musculoskeletal: Normal range of motion.        General: No tenderness or edema.  Neurological: He is alert and oriented to person, place, and time.  Skin: Skin is warm.  Psychiatric: Affect normal.     LABORATORY DATA:  I have reviewed the data as listed Lab Results  Component Value Date   WBC 11.5 (H) 09/11/2018   HGB 12.1 (L) 09/11/2018   HCT 36.7 (L) 09/11/2018   MCV 92.0 09/11/2018   PLT 307 09/11/2018   Recent Labs    09/10/18 1402  09/11/18 0608 09/12/18 0727 09/13/18 1202  NA 130*  --  132* 131* 134*  K 6.0*   < > 5.7* 4.7 3.9  CL 97*  --  98 95* 97*  CO2 16*  --  20* 23 25  GLUCOSE 122*  --  116* 123* 166*  BUN 70*  --  75* 72* 61*  CREATININE 2.84*  --  2.73* 2.51* 1.99*  CALCIUM 8.8*  --  8.4* 8.2* 8.4*  GFRNONAA 20*  --  21* 23* 30*  GFRAA 23*  --  24* 26* 35*  PROT 7.7  --   --  6.2*  --   ALBUMIN 3.7  --   --  3.0*  --   AST 19  --   --  18  --   ALT 14  --   --  15  --   ALKPHOS 71  --   --  64  --   BILITOT 1.0  --   --  0.8  --    < > = values in this interval not displayed.     RADIOGRAPHIC STUDIES: I have personally reviewed the radiological images as listed and agreed with the findings in the report. Ct Chest Wo Contrast  Result Date: 09/11/2018 CLINICAL DATA:  Increasing weakness, fatigue and nausea. EXAM: CT CHEST WITHOUT CONTRAST TECHNIQUE: Multidetector CT imaging of the chest was performed following the standard protocol without IV contrast. COMPARISON:  Chest x-ray 09/10/2018 FINDINGS: Cardiovascular: The heart is normal in size for the patient's age. There is a moderate to large pericardial effusion. This appears to be simple fluid. The pacer wires are in good position without complicating features. There is mild tortuosity, ectasia and moderate atherosclerotic calcifications involving the thoracic aorta. Three-vessel coronary artery calcifications are noted. Mediastinum/Nodes: There is a very large subcarinal mass measuring at least 7.7 x 4.9 x 5.1 cm. It appears to be contiguous with a right lower lobe soft tissue mass in the as ago esophageal recess. This also continues down to the diaphragmatic crus and there is adjacent probable nodal disease just above the celiac axis. A few other small scattered upper mediastinal lymph nodes. I do not see any definite direct involvement of the esophagus. There is some high attenuation material the esophagus which could be debris or pills. Lungs/Pleura: Suspect right lower lobe mass in the as ago esophageal recess contiguous with the large mediastinal mass or adenopathy. Moderate bilateral pleural effusions along with fluid in both major fissures. There is an ill-defined 16 mm lesion in the right middle lobe which is indeterminate. Moderate vascular congestion and probable mild perihilar pulmonary edema. Upper Abdomen: Advanced cirrhotic changes involving the liver with a very irregular liver contour, dilated hepatic fissures and increased caudate  to right lobe ratio. No obvious hepatic lesions without contrast. No splenomegaly. 2  cm nodal lesion noted just above the celiac axis and adjacent to the caudate lobe of the liver. Musculoskeletal: Indeterminate subcutaneous lesion involving the posterior lower thorax. Smaller adjacent lesion is also indeterminate. These could be benign complex sebaceous cysts. Mild symmetric bilateral gynecomastia. Left-sided permanent pacemaker without complicating features. Advanced degenerative changes involving the spine but no worrisome bone lesions. IMPRESSION: 1. 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 PET-CT or biopsy may be helpful for further evaluation. 2. Moderate-sized bilateral pleural effusions. 3. Moderate to large pericardial effusion. 4. Indeterminate 16 mm nodule in the right middle lobe, possibly a metastatic focus. 5. Advanced cirrhotic changes involving the liver without definite hepatic lesions. Aortic Atherosclerosis (ICD10-I70.0). Electronically Signed   By: Marijo Sanes M.D.   On: 09/11/2018 13:51   US Renal  Result Date: 09/11/2018 CLINICAL DATA:  Acute kidney injury EXAM: RENAL / URINARY TRACT ULTRASOUND COMPLETE COMPARISON:  None. FINDINGS: Right Kidney: Renal measurements: 11.0 x 5.4 x 5.1 cm = volume: 159 mL. Slightly increased echotexture diffusely. Cortical thinning. No mass or hydronephrosis Left Kidney: Renal measurements: 11.2 x 5.9 x 5.7 cm = volume: 200 mL. Slightly increased echotexture diffusely with cortical thinning. No mass or hydronephrosis. Bladder: Appears normal for degree of bladder distention. Small amount of ascites seen within the abdomen. IMPRESSION: Increased echotexture and cortical thinning compatible with chronic medical renal disease. No acute findings. No hydronephrosis. Small amount of ascites incidentally noted. Electronically Signed   By: Rolm Baptise M.D.   On: 09/11/2018 10:52   Dg Chest Port 1 View  Result  Date: 09/13/2018 CLINICAL DATA:  85 year old male with history of shortness of breath today. EXAM: PORTABLE CHEST 1 VIEW COMPARISON:  Chest x-ray 09/10/2018. FINDINGS: Small bilateral pleural effusions. Bibasilar opacities which may reflect areas of atelectasis and/or consolidation. Cephalization of the pulmonary vasculature. Enlargement of the cardiopericardial silhouette. Upper mediastinal contours are within normal limits. Aortic atherosclerosis. Left-sided pacemaker with lead tips projecting over the expected location of the right atrium and right ventricle. IMPRESSION: 1. Enlargement of the cardiopericardial silhouette related to known pericardial effusion (better demonstrated on recent chest CT). 2. Small bilateral pleural effusions with bibasilar opacities favored to reflect areas of atelectasis and/or consolidation. 3. Pulmonary venous congestion, without frank pulmonary edema. Electronically Signed   By: Vinnie Langton M.D.   On: 09/13/2018 09:25   Dg Chest Portable 1 View  Result Date: 09/10/2018 CLINICAL DATA:  Shortness of breath. EXAM: PORTABLE CHEST 1 VIEW COMPARISON:  Radiograph of April 06, 2010. FINDINGS: Stable cardiomegaly. Left-sided pacemaker is unchanged in position. No pneumothorax is noted. No significant pleural effusion is noted. Minimal bibasilar subsegmental atelectasis is noted. Degenerative changes are seen involving both glenohumeral joints. IMPRESSION: Minimal bibasilar subsegmental atelectasis. Electronically Signed   By: Marijo Conception M.D.   On: 09/10/2018 14:47    Mass of lower lobe of right lung 85 year old male patient non-smoker/mild CKD is currently admitted to hospital for worsening insufficiency/right lower lobe lung mass/bulky mediastinal adenopathy  #Right lower lobe lung mass/bulky mediastinal adenopathy/pericardial effusion-highly suspicious for malignant process.  Discussed with the patient that this is malignancy until otherwise proven-differential  includes metastatic lung cancer vs. Lymphoma.  Tissue sampling needed for diagnosis; however currently on hold to optimize patient's medical condition/cardiorespiratory status.  Discussed  with pulmonary, Dr. Lanney Gins, and I agree that PET scan inpatient be helpful with biopsy planning given patient's tenuous cardiorespiratory status.  Will discuss with radiology tomorrow.  #Chronic kidney disease/acute renal failure-Baseline creatinine 1.5.  Currently creatinine 2.4.  Will hold off any IV contrast studies.  Discussed with nephrology.  Will check myeloma panel.  # Thank you Dr.Konidena for allowing me to participate in the care of your pleasant patient. Please do not hesitate to contact me with questions or concerns in the interim. Also discussed with Dr.Lateef.  I was unable to reach patient's son Jenny Reichmann- " nonfunctional phone number".   # I reviewed the blood work- with the patient in detail; also reviewed the imaging independently [as summarized above]; and with the patient in detail.     All questions were answered. The patient knows to call the clinic with any problems, questions or concerns.   Cammie Sickle, MD 09/13/2018 7:31 PM

## 2018-09-13 NOTE — Consult Note (Signed)
Pulmonary Medicine          Date: 09/13/2018,   MRN# [id number] Javier Wong 05/19/1932     AdmissionWeight: 108.9 kg                 CurrentWeight: 115.9 kg        SUBJECTIVE   Patient with bilateral pleural effusions, CHF with pericardial effusion and new RLL lung mass with subcarinal extension.   Worsening SOB and crackles on auscultation overnight.  He was started on IV fluids yesterday 50cc/hr, I will stop this for now.  Patient is on lasix at home.   He started wheezing likely due to pulmonary congestion.   We discussed biopsy again and he had few more questions which we discussed. He wants to do biopsy while inpatient but I explained its better to do safely post optimization of cardiac status and pulmonary preoperative eval.    PAST MEDICAL HISTORY   No past medical history on file.   SURGICAL HISTORY   Patient denies previous surgery   FAMILY HISTORY   No family history on file.   SOCIAL HISTORY   Social History   Tobacco Use  . Smoking status: Never Smoker  Substance Use Topics  . Alcohol use: Never    Frequency: Never  . Drug use: Never     MEDICATIONS    Home Medication:    Current Medication:  Current Facility-Administered Medications:  .  acetaminophen (TYLENOL) tablet 650 mg, 650 mg, Oral, Q6H PRN **OR** acetaminophen (TYLENOL) suppository 650 mg, 650 mg, Rectal, Q6H PRN, Epifanio Lesches, MD .  bisacodyl (DULCOLAX) EC tablet 5 mg, 5 mg, Oral, Daily PRN, Epifanio Lesches, MD .  docusate sodium (COLACE) capsule 100 mg, 100 mg, Oral, BID, Vianne Bulls, Snehalatha, MD .  feeding supplement (NEPRO CARB STEADY) liquid 237 mL, 237 mL, Oral, BID BM, Mayo, Pete Pelt, MD .  heparin injection 5,000 Units, 5,000 Units, Subcutaneous, Q8H, Epifanio Lesches, MD, 5,000 Units at 09/13/18 0552 .  HYDROcodone-acetaminophen (NORCO/VICODIN) 5-325 MG per tablet 1 tablet, 1 tablet, Oral, Q4H PRN, Epifanio Lesches, MD .   insulin aspart (novoLOG) injection 0-9 Units, 0-9 Units, Subcutaneous, TID WC, Epifanio Lesches, MD, 1 Units at 09/12/18 1304 .  ipratropium-albuterol (DUONEB) 0.5-2.5 (3) MG/3ML nebulizer solution 3 mL, 3 mL, Nebulization, Q4H PRN, Lance Coon, MD, 3 mL at 09/12/18 2326 .  multivitamin with minerals tablet 1 tablet, 1 tablet, Oral, Daily, Mayo, Pete Pelt, MD, 1 tablet at 09/12/18 [phone] .  ondansetron (ZOFRAN) tablet 4 mg, 4 mg, Oral, Q6H PRN **OR** ondansetron (ZOFRAN) injection 4 mg, 4 mg, Intravenous, Q6H PRN, Epifanio Lesches, MD .  sodium zirconium cyclosilicate (LOKELMA) packet 10 g, 10 g, Oral, Daily, Lateef, Munsoor, MD .  traZODone (DESYREL) tablet 25 mg, 25 mg, Oral, QHS PRN, Epifanio Lesches, MD, 25 mg at 09/12/18 2055    ALLERGIES   Patient has no known allergies.     REVIEW OF SYSTEMS    Review of Systems:  Gen:  Denies  fever, sweats, chills weigh loss  HEENT: Denies blurred vision, double vision, ear pain, eye pain, hearing loss, nose bleeds, sore throat Cardiac:  No dizziness, chest pain or heaviness, chest tightness,edema Resp:   Denies cough or sputum porduction, shortness of breath,wheezing, hemoptysis,  Gi: Denies swallowing difficulty, stomach pain, nausea or vomiting, diarrhea, constipation, bowel incontinence Gu:  Denies bladder incontinence, burning urine Ext:   Denies Joint pain, stiffness or swelling Skin: Denies  skin rash, easy bruising  or bleeding or hives Endoc:  Denies polyuria, polydipsia , polyphagia or weight change Psych:   Denies depression, insomnia or hallucinations   Other:  All other systems negative   VS: BP (!) 123/92 (BP Location: Left Arm)   Pulse (!) 47   Temp (!) 97.5 F (36.4 C) (Oral)   Resp 19   Ht 5\' 11"  (1.803 m)   Wt 115.9 kg   SpO2 94%   BMI 35.65 kg/m      PHYSICAL EXAM    GENERAL:NAD, no fevers, chills, no weakness no fatigue HEAD: Normocephalic, atraumatic.  EYES: Pupils equal, round, reactive to  light. Extraocular muscles intact. No scleral icterus.  MOUTH: Moist mucosal membrane. Dentition intact. No abscess noted.  EAR, NOSE, THROAT: Clear without exudates. No external lesions.  NECK: Supple. No thyromegaly. No nodules. No JVD.  PULMONARY: Bibasilar crackles without wheezing or rhonchorous breath sounds CARDIOVASCULAR: S1 and S2. Regular rate and rhythm. No murmurs, rubs, or gallops. No edema. Pedal pulses 2+ bilaterally.  GASTROINTESTINAL: Soft, nontender, nondistended. No masses. Positive bowel sounds. No hepatosplenomegaly.  MUSCULOSKELETAL: No swelling, clubbing, or edema. Range of motion full in all extremities.  NEUROLOGIC: Cranial nerves II through XII are intact. No gross focal neurological deficits. Sensation intact. Reflexes intact.  SKIN: No ulceration, lesions, rashes, or cyanosis. Skin warm and dry. Turgor intact.  PSYCHIATRIC: Mood, affect within normal limits. The patient is awake, alert and oriented x 3. Insight, judgment intact.       IMAGING    Ct Chest Wo Contrast  Result Date: 09/11/2018 CLINICAL DATA:  Increasing weakness, fatigue and nausea. EXAM: CT CHEST WITHOUT CONTRAST TECHNIQUE: Multidetector CT imaging of the chest was performed following the standard protocol without IV contrast. COMPARISON:  Chest x-ray 09/10/2018 FINDINGS: Cardiovascular: The heart is normal in size for the patient's age. There is a moderate to large pericardial effusion. This appears to be simple fluid. The pacer wires are in good position without complicating features. There is mild tortuosity, ectasia and moderate atherosclerotic calcifications involving the thoracic aorta. Three-vessel coronary artery calcifications are noted. Mediastinum/Nodes: There is a very large subcarinal mass measuring at least 7.7 x 4.9 x 5.1 cm. It appears to be contiguous with a right lower lobe soft tissue mass in the as ago esophageal recess. This also continues down to the diaphragmatic crus and there is  adjacent probable nodal disease just above the celiac axis. A few other small scattered upper mediastinal lymph nodes. I do not see any definite direct involvement of the esophagus. There is some high attenuation material the esophagus which could be debris or pills. Lungs/Pleura: Suspect right lower lobe mass in the as ago esophageal recess contiguous with the large mediastinal mass or adenopathy. Moderate bilateral pleural effusions along with fluid in both major fissures. There is an ill-defined 16 mm lesion in the right middle lobe which is indeterminate. Moderate vascular congestion and probable mild perihilar pulmonary edema. Upper Abdomen: Advanced cirrhotic changes involving the liver with a very irregular liver contour, dilated hepatic fissures and increased caudate to right lobe ratio. No obvious hepatic lesions without contrast. No splenomegaly. 2 cm nodal lesion noted just above the celiac axis and adjacent to the caudate lobe of the liver. Musculoskeletal: Indeterminate subcutaneous lesion involving the posterior lower thorax. Smaller adjacent lesion is also indeterminate. These could be benign complex sebaceous cysts. Mild symmetric bilateral gynecomastia. Left-sided permanent pacemaker without complicating features. Advanced degenerative changes involving the spine but no worrisome bone lesions. IMPRESSION: 1. 7.0  x 4.7 cm right lower lobe lung mass in the azygoesophageal recess with bulky contiguous tumor or adenopathy in the subcarinal region measuring at least 7.7 x 4.9 x 5.1 cm. PET-CT or biopsy may be helpful for further evaluation. 2. Moderate-sized bilateral pleural effusions. 3. Moderate to large pericardial effusion. 4. Indeterminate 16 mm nodule in the right middle lobe, possibly a metastatic focus. 5. Advanced cirrhotic changes involving the liver without definite hepatic lesions. Aortic Atherosclerosis (ICD10-I70.0). Electronically Signed   By: Marijo Sanes M.D.   On: 09/11/2018 13:51    US Renal  Result Date: 09/11/2018 CLINICAL DATA:  Acute kidney injury EXAM: RENAL / URINARY TRACT ULTRASOUND COMPLETE COMPARISON:  None. FINDINGS: Right Kidney: Renal measurements: 11.0 x 5.4 x 5.1 cm = volume: 159 mL. Slightly increased echotexture diffusely. Cortical thinning. No mass or hydronephrosis Left Kidney: Renal measurements: 11.2 x 5.9 x 5.7 cm = volume: 200 mL. Slightly increased echotexture diffusely with cortical thinning. No mass or hydronephrosis. Bladder: Appears normal for degree of bladder distention. Small amount of ascites seen within the abdomen. IMPRESSION: Increased echotexture and cortical thinning compatible with chronic medical renal disease. No acute findings. No hydronephrosis. Small amount of ascites incidentally noted. Electronically Signed   By: Rolm Baptise M.D.   On: 09/11/2018 10:52   Dg Chest Portable 1 View  Result Date: 09/10/2018 CLINICAL DATA:  Shortness of breath. EXAM: PORTABLE CHEST 1 VIEW COMPARISON:  Radiograph of April 06, 2010. FINDINGS: Stable cardiomegaly. Left-sided pacemaker is unchanged in position. No pneumothorax is noted. No significant pleural effusion is noted. Minimal bibasilar subsegmental atelectasis is noted. Degenerative changes are seen involving both glenohumeral joints. IMPRESSION: Minimal bibasilar subsegmental atelectasis. Electronically Signed   By: Marijo Conception M.D.   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   -stopping IVF, CXR this am- bilateral blunted CPA with cephalization and KerleyB lines suggestive of interstitial edema.  Patient with worsening crackles on auscultation likely sensitive to IVF due to CHF/CKD.     Thank you for allowing me to participate in the care of this patient.   Patient/Family are satisfied with care plan and all questions have been answered.  This document was prepared using Dragon voice recognition software and may include unintentional dictation errors.     Ottie Glazier, M.D.  Division of [facility]

## 2018-09-13 NOTE — Progress Notes (Signed)
[facility]  ROUNDING NOTE   Subjective:  Renal function has improved a bit. BUN currently 61 with a creatinine of 1.9. Case discussed with oncology given his underlying lung mass.    Objective:  Vital signs in last 24 hours:  Temp:  [97.5 F (36.4 C)-98.5 F (36.9 C)] 97.5 F (36.4 C) (07/26 0814) Pulse Rate:  [47-63] 63 (07/26 0814) Resp:  [18-19] 18 (07/26 0814) BP: (123-156)/(62-92) 138/90 (07/26 0814) SpO2:  [93 %-97 %] 97 % (07/26 0814) Weight:  [115.9 kg] 115.9 kg (07/26 0320)  Weight change: 3.583 kg Filed Weights   09/11/18 0421 09/12/18 0420 09/13/18 0320  Weight: 111.3 kg 112.4 kg 115.9 kg    Intake/Output: I/O last 3 completed shifts: In: 1350 [P.O.:480; I.V.:870] Out: 501 [Urine:501]   Intake/Output this shift:  Total I/O In: 240 [P.O.:240] Out: 600 [Urine:600]  Physical Exam: General: No acute distress  Head: Normocephalic, atraumatic. Moist oral mucosal membranes  Eyes: Anicteric  Neck: Supple, trachea midline  Lungs:  Clear to auscultation, normal effort  Heart: S1S2 no rubs  Abdomen:  Soft, nontender, bowel sounds present  Extremities: 2+ peripheral edema.  Neurologic: Awake, alert, following commands  Skin: No lesions       Basic Metabolic Panel: Recent Labs  Lab 09/10/18 1402 09/10/18 1952 09/11/18 0608 09/12/18 0727 09/13/18 1202  NA 130*  --  132* 131* 134*  K 6.0* 5.6* 5.7* 4.7 3.9  CL 97*  --  98 95* 97*  CO2 16*  --  20* 23 25  GLUCOSE 122*  --  116* 123* 166*  BUN 70*  --  75* 72* 61*  CREATININE 2.84*  --  2.73* 2.51* 1.99*  CALCIUM 8.8*  --  8.4* 8.2* 8.4*  MG 2.6*  --   --   --   --     Liver Function Tests: Recent Labs  Lab 09/10/18 1402 09/12/18 0727  AST 19 18  ALT 14 15  ALKPHOS 71 64  BILITOT 1.0 0.8  PROT 7.7 6.2*  ALBUMIN 3.7 3.0*   No results for input(s): LIPASE, AMYLASE in the last 168 hours. No results for input(s): AMMONIA in the last 168 hours.  CBC: Recent Labs  Lab  09/10/18 1402 09/11/18 0608  WBC 14.3* 11.5*  NEUTROABS 12.9*  --   HGB 13.5 12.1*  HCT 41.4 36.7*  MCV 91.8 92.0  PLT 420* 307    Cardiac Enzymes: No results for input(s): CKTOTAL, CKMB, CKMBINDEX, TROPONINI in the last 168 hours.  BNP: Invalid input(s): POCBNP  CBG: Recent Labs  Lab 09/12/18 1204 09/12/18 1630 09/12/18 2112 09/13/18 0813 09/13/18 1136  GLUCAP 133* 115* 123* 114* 174*    Microbiology: Results for orders placed or performed during the hospital encounter of 09/10/18  SARS Coronavirus 2 (CEPHEID - Performed in [facility] hospital lab), Hosp Order     Status: None   Collection Time: 09/10/18  2:03 PM   Specimen: Nasopharyngeal Swab  Result Value Ref Range Status   SARS Coronavirus 2 NEGATIVE NEGATIVE Final    Comment: (NOTE) If result is NEGATIVE SARS-CoV-2 target nucleic acids are NOT DETECTED. The SARS-CoV-2 RNA is generally detectable in upper and lower  respiratory specimens during the acute phase of infection. The lowest  concentration of SARS-CoV-2 viral copies this assay can detect is 250  copies / mL. A negative result does not preclude SARS-CoV-2 infection  and should not be used as the sole basis for treatment or other  patient management  decisions.  A negative result may occur with  improper specimen collection / handling, submission of specimen other  than nasopharyngeal swab, presence of viral mutation(s) within the  areas targeted by this assay, and inadequate number of viral copies  (<250 copies / mL). A negative result must be combined with clinical  observations, patient history, and epidemiological information. If result is POSITIVE SARS-CoV-2 target nucleic acids are DETECTED. The SARS-CoV-2 RNA is generally detectable in upper and lower  respiratory specimens dur ing the acute phase of infection.  Positive  results are indicative of active infection with SARS-CoV-2.  Clinical  correlation with patient history and other  diagnostic information is  necessary to determine patient infection status.  Positive results do  not rule out bacterial infection or co-infection with other viruses. If result is PRESUMPTIVE POSTIVE SARS-CoV-2 nucleic acids MAY BE PRESENT.   A presumptive positive result was obtained on the submitted specimen  and confirmed on repeat testing.  While 2019 novel coronavirus  (SARS-CoV-2) nucleic acids may be present in the submitted sample  additional confirmatory testing may be necessary for epidemiological  and / or clinical management purposes  to differentiate between  SARS-CoV-2 and other Sarbecovirus currently known to infect humans.  If clinically indicated additional testing with an alternate test  methodology [phone]) is advised. The SARS-CoV-2 RNA is generally  detectable in upper and lower respiratory sp ecimens during the acute  phase of infection. The expected result is Negative. Fact Sheet for Patients:  StrictlyIdeas.no Fact Sheet for Healthcare Providers: BankingDealers.co.za This test is not yet approved or cleared by the [place] FDA and has been authorized for detection and/or diagnosis of SARS-CoV-2 by FDA under an Emergency Use Authorization (EUA).  This EUA will remain in effect (meaning this test can be used) for the duration of the COVID-19 declaration under Section 564(b)(1) of the Act, 21 U.S.C. section 360bbb-3(b)(1), unless the authorization is terminated or revoked sooner. Performed at [facility], [address] [place], [place] [address]     Coagulation Studies: No results for input(s): LABPROT, INR in the last 72 hours.  Urinalysis: Recent Labs    09/13/18 0515  COLORURINE YELLOW*  LABSPEC 1.016  PHURINE 5.0  GLUCOSEU NEGATIVE  HGBUR NEGATIVE  BILIRUBINUR NEGATIVE  KETONESUR 5*  PROTEINUR NEGATIVE  NITRITE NEGATIVE  LEUKOCYTESUR NEGATIVE      Imaging: Dg Chest Port 1  View  Result Date: 09/13/2018 CLINICAL DATA:  85 year old male with history of shortness of breath today. EXAM: PORTABLE CHEST 1 VIEW COMPARISON:  Chest x-ray 09/10/2018. FINDINGS: Small bilateral pleural effusions. Bibasilar opacities which may reflect areas of atelectasis and/or consolidation. Cephalization of the pulmonary vasculature. Enlargement of the cardiopericardial silhouette. Upper mediastinal contours are within normal limits. Aortic atherosclerosis. Left-sided pacemaker with lead tips projecting over the expected location of the right atrium and right ventricle. IMPRESSION: 1. Enlargement of the cardiopericardial silhouette related to known pericardial effusion (better demonstrated on recent chest CT). 2. Small bilateral pleural effusions with bibasilar opacities favored to reflect areas of atelectasis and/or consolidation. 3. Pulmonary venous congestion, without frank pulmonary edema. Electronically Signed   By: Vinnie Langton M.D.   On: 09/13/2018 09:25     Medications:    . docusate sodium  100 mg Oral BID  . feeding supplement (NEPRO CARB STEADY)  237 mL Oral BID BM  . heparin  5,000 Units Subcutaneous Q8H  . insulin aspart  0-9 Units Subcutaneous TID WC  . multivitamin with minerals  1  tablet Oral Daily  . sodium zirconium cyclosilicate  10 g Oral Daily   acetaminophen **OR** acetaminophen, bisacodyl, HYDROcodone-acetaminophen, ipratropium-albuterol, ondansetron **OR** ondansetron (ZOFRAN) IV, traZODone  Assessment/ Plan:  85 y.o. male  with a PMHx of hypertension, hyperlipidemia, chronic kidney disease stage III baseline creatinine 1.5, EGFR 45, history of complete heart block status post dual chamber pacemaker placement, who was admitted to [facility] on 09/10/2018 for evaluation of weakness, weakness, and poor p.o. intake.  1.  Acute renal failure, question secondary to altered cardiorenal hemodynamics and use of aleve.  2.  Chronic kidney disease stage III baseline creatinine  1.5, EGFR 45. 3.  Pericardial effusion, moderate in size, cardiology following, no acute tamponade physiology. 4.  Anemia of chronic kidney disease. 5.  Hyperkalemia. 6.  Metabolic acidosis. 7.  Right lower lobe lung mass.   Plan: Kidney function continues to improve.  Creatinine now down to 1.9.  Still a bit above his baseline.  Patient with known pericardial effusion as well as right lower lobe lung mass.  Further work-up as per oncology.  Continue to monitor renal parameters daily while admitted.  No indication for dialysis at the moment.  LOS: 3 Jil Penland 7/26/20203:48 PM

## 2018-09-13 NOTE — Progress Notes (Addendum)
Patient Name: Javier Wong Date of Encounter: 09/13/2018  Hospital Problem List     Active Problems:   AKI (acute kidney injury) [facility])   Pericardial effusion    Patient Profile     85 year old male with shortness of breath and moderate pericardial effusion  Subjective   Still short of breath but somewhat better.  Alert and oriented.  Hemodynamically stable.  Inpatient Medications    . docusate sodium  100 mg Oral BID  . feeding supplement (NEPRO CARB STEADY)  237 mL Oral BID BM  . heparin  5,000 Units Subcutaneous Q8H  . insulin aspart  0-9 Units Subcutaneous TID WC  . multivitamin with minerals  1 tablet Oral Daily  . sodium zirconium cyclosilicate  10 g Oral Daily    Vital Signs    Vitals:   09/12/18 1940 09/12/18 1943 09/13/18 0320 09/13/18 0814  BP: (!) 156/64 132/62 (!) 123/92 138/90  Pulse: (!) 52 (!) 50 (!) 47 63  Resp: 18  19 18   Temp: 98.5 F (36.9 C)  (!) 97.5 F (36.4 C) (!) 97.5 F (36.4 C)  TempSrc: Oral  Oral Oral  SpO2: 93%  94% 97%  Weight:   115.9 kg   Height:        Intake/Output Summary (Last 24 hours) at 09/13/2018 1102 Last data filed at 09/13/2018 1012 Gross per 24 hour  Intake 1589.98 ml  Output 600 ml  Net 989.98 ml   Filed Weights   09/11/18 0421 09/12/18 0420 09/13/18 0320  Weight: 111.3 kg 112.4 kg 115.9 kg    Physical Exam    GEN: Well nourished, well developed, in no acute distress.  HEENT: normal.  Neck: Supple, no JVD, carotid bruits, or masses. Cardiac: Irregular rhythm. Respiratory:  Respirations regular and unlabored, clear to auscultation bilaterally. GI: Soft, nontender, nondistended, BS + x 4. MS: no deformity or atrophy. Skin: warm and dry, no rash. Neuro:  Strength and sensation are intact. Psych: Normal affect.  Labs    CBC Recent Labs    09/10/18 1402 09/11/18 0608  WBC 14.3* 11.5*  NEUTROABS 12.9*  --   HGB 13.5 12.1*  HCT 41.4 36.7*  MCV 91.8 92.0  PLT 420* 235   Basic Metabolic  Panel Recent Labs    09/10/18 1402  09/11/18 0608 09/12/18 0727  NA 130*  --  132* 131*  K 6.0*   < > 5.7* 4.7  CL 97*  --  98 95*  CO2 16*  --  20* 23  GLUCOSE 122*  --  116* 123*  BUN 70*  --  75* 72*  CREATININE 2.84*  --  2.73* 2.51*  CALCIUM 8.8*  --  8.4* 8.2*  MG 2.6*  --   --   --    < > = values in this interval not displayed.   Liver Function Tests Recent Labs    09/10/18 1402 09/12/18 0727  AST 19 18  ALT 14 15  ALKPHOS 71 64  BILITOT 1.0 0.8  PROT 7.7 6.2*  ALBUMIN 3.7 3.0*   No results for input(s): LIPASE, AMYLASE in the last 72 hours. Cardiac Enzymes No results for input(s): CKTOTAL, CKMB, CKMBINDEX, TROPONINI in the last 72 hours. BNP Recent Labs    09/10/18 1402  BNP 72.0   D-Dimer No results for input(s): DDIMER in the last 72 hours. Hemoglobin A1C Recent Labs    09/10/18 1952  HGBA1C 6.1*   Fasting Lipid Panel No results for input(s): CHOL,  HDL, LDLCALC, TRIG, CHOLHDL, LDLDIRECT in the last 72 hours. Thyroid Function Tests Recent Labs    09/10/18 1952  TSH 1.404    Telemetry    Sinus rhythm intermittent atrial flutter  ECG    Atrial flutter with ventricular pacing  Radiology    Ct Chest Wo Contrast  Result Date: 09/11/2018 CLINICAL DATA:  Increasing weakness, fatigue and nausea. EXAM: CT CHEST WITHOUT CONTRAST TECHNIQUE: Multidetector CT imaging of the chest was performed following the standard protocol without IV contrast. COMPARISON:  Chest x-ray 09/10/2018 FINDINGS: Cardiovascular: The heart is normal in size for the patient's age. There is a moderate to large pericardial effusion. This appears to be simple fluid. The pacer wires are in good position without complicating features. There is mild tortuosity, ectasia and moderate atherosclerotic calcifications involving the thoracic aorta. Three-vessel coronary artery calcifications are noted. Mediastinum/Nodes: There is a very large subcarinal mass measuring at least 7.7 x 4.9 x  5.1 cm. It appears to be contiguous with a right lower lobe soft tissue mass in the as ago esophageal recess. This also continues down to the diaphragmatic crus and there is adjacent probable nodal disease just above the celiac axis. A few other small scattered upper mediastinal lymph nodes. I do not see any definite direct involvement of the esophagus. There is some high attenuation material the esophagus which could be debris or pills. Lungs/Pleura: Suspect right lower lobe mass in the as ago esophageal recess contiguous with the large mediastinal mass or adenopathy. Moderate bilateral pleural effusions along with fluid in both major fissures. There is an ill-defined 16 mm lesion in the right middle lobe which is indeterminate. Moderate vascular congestion and probable mild perihilar pulmonary edema. Upper Abdomen: Advanced cirrhotic changes involving the liver with a very irregular liver contour, dilated hepatic fissures and increased caudate to right lobe ratio. No obvious hepatic lesions without contrast. No splenomegaly. 2 cm nodal lesion noted just above the celiac axis and adjacent to the caudate lobe of the liver. Musculoskeletal: Indeterminate subcutaneous lesion involving the posterior lower thorax. Smaller adjacent lesion is also indeterminate. These could be benign complex sebaceous cysts. Mild symmetric bilateral gynecomastia. Left-sided permanent pacemaker without complicating features. Advanced degenerative changes involving the spine but no worrisome bone lesions. IMPRESSION: 1. 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 PET-CT or biopsy may be helpful for further evaluation. 2. Moderate-sized bilateral pleural effusions. 3. Moderate to large pericardial effusion. 4. Indeterminate 16 mm nodule in the right middle lobe, possibly a metastatic focus. 5. Advanced cirrhotic  changes involving the liver without definite hepatic lesions. Aortic Atherosclerosis (ICD10-I70.0). Electronically Signed   By: Marijo Sanes M.D.   On: 09/11/2018 13:51   US Renal  Result Date: 09/11/2018 CLINICAL DATA:  Acute kidney injury EXAM: RENAL / URINARY TRACT ULTRASOUND COMPLETE COMPARISON:  None. FINDINGS: Right Kidney: Renal measurements: 11.0 x 5.4 x 5.1 cm = volume: 159 mL. Slightly increased echotexture diffusely. Cortical thinning. No mass or hydronephrosis Left Kidney: Renal measurements: 11.2 x 5.9 x 5.7 cm = volume: 200 mL. Slightly increased echotexture diffusely with cortical thinning. No mass or hydronephrosis. Bladder: Appears normal for degree of bladder distention. Small amount of ascites seen within the abdomen. IMPRESSION: Increased echotexture and cortical thinning compatible with chronic medical renal disease. No acute findings. No hydronephrosis. Small amount of ascites incidentally noted. Electronically Signed  By: Rolm Baptise M.D.   On: 09/11/2018 10:52   Dg Chest Port 1 View  Result Date: 09/13/2018 CLINICAL DATA:  85 year old male with history of shortness of breath today. EXAM: PORTABLE CHEST 1 VIEW COMPARISON:  Chest x-ray 09/10/2018. FINDINGS: Small bilateral pleural effusions. Bibasilar opacities which may reflect areas of atelectasis and/or consolidation. Cephalization of the pulmonary vasculature. Enlargement of the cardiopericardial silhouette. Upper mediastinal contours are within normal limits. Aortic atherosclerosis. Left-sided pacemaker with lead tips projecting over the expected location of the right atrium and right ventricle. IMPRESSION: 1. Enlargement of the cardiopericardial silhouette related to known pericardial effusion (better demonstrated on recent chest CT). 2. Small bilateral pleural effusions with bibasilar opacities favored to reflect areas of atelectasis and/or consolidation. 3. Pulmonary venous congestion, without frank pulmonary edema.  Electronically Signed   By: Vinnie Langton M.D.   On: 09/13/2018 09:25   Dg Chest Portable 1 View  Result Date: 09/10/2018 CLINICAL DATA:  Shortness of breath. EXAM: PORTABLE CHEST 1 VIEW COMPARISON:  Radiograph of April 06, 2010. FINDINGS: Stable cardiomegaly. Left-sided pacemaker is unchanged in position. No pneumothorax is noted. No significant pleural effusion is noted. Minimal bibasilar subsegmental atelectasis is noted. Degenerative changes are seen involving both glenohumeral joints. IMPRESSION: Minimal bibasilar subsegmental atelectasis. Electronically Signed   By: Marijo Conception M.D.   On: 09/10/2018 14:47    Assessment & Plan    85 year old male admitted with shortness of breath noted to have acute renal insufficiency and moderate pericardial effusion.  Hemodynamically stable.  Still somewhat short of breath.  Will repeat echocardiogram in the morning and if there is continues to be significant pericardial effusion, consideration for pericardial window by cardiothoracic surgery.  Renal insufficiency--we will follow renal function.  Appears to be improving.  Signed, Javier Docker Tasheba Henson MD 09/13/2018, 11:02 AM  Pager: [phone]

## 2018-09-13 NOTE — Progress Notes (Signed)
[place] [facility] [place] NAME: Faizon Capozzi    MR#:  [id number]  DATE OF BIRTH:  1932/10/05  SUBJECTIVE:   Still somewhat short of breath.  Feels weak but no chest pain.   REVIEW OF SYSTEMS:  Review of Systems  Constitutional: Negative for chills, fever and malaise/fatigue.  HENT: Negative for congestion and sore throat.   Eyes: Negative for blurred vision and double vision.  Respiratory: Positive for shortness of breath. Negative for cough.   Cardiovascular: Negative for chest pain and palpitations.  Gastrointestinal: Negative for abdominal pain, nausea and vomiting.  Genitourinary: Negative for dysuria and urgency.  Musculoskeletal: Negative for back pain and neck pain.  Neurological: Positive for weakness. Negative for dizziness, focal weakness and headaches.  Psychiatric/Behavioral: Negative for depression. The patient is not nervous/anxious.     DRUG ALLERGIES:  No Known Allergies VITALS:  Blood pressure 138/90, pulse 63, temperature (!) 97.5 F (36.4 C), temperature source Oral, resp. rate 18, height 5\' 11"  (1.803 m), weight 115.9 kg, SpO2 97 %. PHYSICAL EXAMINATION:  Physical Exam  GENERAL:   Denies any complaints. HEENT: Head atraumatic, normoceph and accommodation. No scleral icterus. Extraocular muscles intact. Oropharynx and nasopharynx clear.  NECK:  Supple, no jugular venous distention. No thyroid enlargement. LUNGS diminished air entry bilaterally.  Basilar crepitations present. CARDIOVASCULAR: Bradycardia resolved, heart rate 62 bpm. ABDOMEN: Soft, nontender, nondistended. Bowel sounds present.  EXTREMITIES: No pedal edema, cyanosis, or clubbing.  NEUROLOGIC: CN 2-12 intact, no focal deficits. +global weakness. Sensation intact throughout. Gait not checked.  PSYCHIATRIC: The patient is alert and oriented x 3.  SKIN: No obvious rash, lesion, or ulcer.  LABORATORY PANEL:  Male CBC Recent Labs  Lab 09/11/18 0608   WBC 11.5*  HGB 12.1*  HCT 36.7*  PLT 307   ------------------------------------------------------------------------------------------------------------------ Chemistries  Recent Labs  Lab 09/10/18 1402  09/12/18 0727 09/13/18 1202  NA 130*   < > 131* 134*  K 6.0*   < > 4.7 3.9  CL 97*   < > 95* 97*  CO2 16*   < > 23 25  GLUCOSE 122*   < > 123* 166*  BUN 70*   < > 72* 61*  CREATININE 2.84*   < > 2.51* 1.99*  CALCIUM 8.8*   < > 8.2* 8.4*  MG 2.6*  --   --   --   AST 19  --  18  --   ALT 14  --  15  --   ALKPHOS 71  --  64  --   BILITOT 1.0  --  0.8  --    < > = values in this interval not displayed.   RADIOLOGY:  Dg Chest Port 1 View  Result Date: 09/13/2018 CLINICAL DATA:  85 year old male with history of shortness of breath today. EXAM: PORTABLE CHEST 1 VIEW COMPARISON:  Chest x-ray 09/10/2018. FINDINGS: Small bilateral pleural effusions. Bibasilar opacities which may reflect areas of atelectasis and/or consolidation. Cephalization of the pulmonary vasculature. Enlargement of the cardiopericardial silhouette. Upper mediastinal contours are within normal limits. Aortic atherosclerosis. Left-sided pacemaker with lead tips projecting over the expected location of the right atrium and right ventricle. IMPRESSION: 1. Enlargement of the cardiopericardial silhouette related to known pericardial effusion (better demonstrated on recent chest CT). 2. Small bilateral pleural effusions with bibasilar opacities favored to reflect areas of atelectasis and/or consolidation. 3. Pulmonary venous congestion, without frank pulmonary edema. Electronically Signed   By: Vinnie Langton  M.D.   On: 09/13/2018 09:25   ASSESSMENT AND PLAN:   Moderate pericardial effusion- seen on echo.  No evidence of tamponade physiology.,  Cardiology wants to repeat another echocardiogram tomorrow and decide on pericardial window if needed as per discussion with Dr. Ubaldo Glassing    CT chest is showing right lung mass with  bulky tumor 74.7 cm.  Also had moderate bilateral pleural effusion.;  Consulted pulmonary, oncology, needs outpatient work-up with full PET scan, lung functions, and need ENB-ABS for tissue sampling.  --Acute kidney injury- likely secondary to NSAID use and poor cardiac output.  Renal function is slowly improving, creatinine down from 2.84-1.9. -Renal ultrasound showed medical renal disease, but no acute abnormalities -Nephrology consulted -Avoid nephrotoxic agents, IV fluids discontinued because of concern for CHF.  Chest x-ray showed pulmonary vascular congestion and also had more crackles on physical exam. -Hyperkalemia- likely secondary to above.  Potassium improving. -recommendations, potassium decreased from 6-4.7.  Lokelma change to 10 mg daily  Chronic atrial fibrillation with dual-chamber pacemaker in place -Cardiology following  Deconditioning- likely due to above -PT consult  Type 2 diabetes -Continue SSI  Chronic diastolic congestive heart failure- slightly volume overloaded yesterday due to worsening shortness of breath with basilar crepitations, stopped IV fluids, chest x-ray today showed pleural effusion, mild congestion. All the records are reviewed and case discussed with Care Management/Social Worker. Management plans discussed with the patient, family and they are in agreement.  CODE STATUS: Full Code  TOTAL TIME TAKING CARE OF THIS PATIENT: 35 minutes.   More than 50% of the time was spent in counseling/coordination of care: YES  POSSIBLE D/C IN 3-4 DAYS, DEPENDING ON CLINICAL CONDITION.   Epifanio Lesches M.D on 09/13/2018 at 1:39 PM  Between 7am to 6pm - Pager - [phone]  After 6pm go to [email or web address] - Proofreader  [facility]  Office  [phone]  CC: Primary care physician; Glendon Axe, MD  Note: This dictation was prepared with Dragon dictation along with smaller phrase technology. Any transcriptional  errors that result from this process are unintentional.

## 2018-09-13 NOTE — Assessment & Plan Note (Addendum)
85 year old male patient non-smoker/mild CKD is currently admitted to hospital for worsening insufficiency/right lower lobe lung mass/bulky mediastinal adenopathy  #Right lower lobe lung mass/bulky mediastinal adenopathy/pericardial effusion-highly suspicious for malignant process.   PET scan results with the patient-right infrahilar mediastinal adenopathy/bulky soft tissue masses adjacent to distal esophagus/along the diaphragmatic crus; right femoral lesion; possible liver lesion.  Unfortunately biopsy could not be done yesterday because of scheduling reasons.  I discussed with IR-awaiting to have biopsy today.  #Chronic kidney disease/acute renal failure-Baseline creatinine 1.5.  Improving at 1.6.  Discussed with Dr.Kolluru.   #Disposition: Patient is recommended SNF placement; however he wants to go home/concerns for coronavirus.  He states his son should be in town visiting from [place].  I will reach out to patient's son.  If patient is discharged over the weekend-I have the patient follow-up with me in the clinic next week to discuss the pathology.

## 2018-09-14 ENCOUNTER — Inpatient Hospital Stay
Admit: 2018-09-14 | Discharge: 2018-09-14 | Disposition: A | Discharge status: Home / Self Care | Payer: Medicare Other | Attending: Cardiology | Admitting: Cardiology

## 2018-09-14 ENCOUNTER — Encounter: Payer: Self-pay | Admitting: *Deleted

## 2018-09-14 ENCOUNTER — Telehealth: Payer: Self-pay | Admitting: Internal Medicine

## 2018-09-14 LAB — CBC
HCT: 39.7 % (ref 39.0–52.0)
Hemoglobin: 13 g/dL (ref 13.0–17.0)
MCH: 30.2 pg (ref 26.0–34.0)
MCHC: 32.7 g/dL (ref 30.0–36.0)
MCV: 92.1 fL (ref 80.0–100.0)
Platelets: 284 10*3/uL (ref 150–400)
RBC: 4.31 MIL/uL (ref 4.22–5.81)
RDW: 14.3 % (ref 11.5–15.5)
WBC: 10.5 10*3/uL (ref 4.0–10.5)
nRBC: 0 % (ref 0.0–0.2)

## 2018-09-14 LAB — PROTEIN ELECTROPHORESIS, SERUM
A/G Ratio: 1 (ref 0.7–1.7)
Albumin ELP: 3 g/dL (ref 2.9–4.4)
Alpha-1-Globulin: 0.3 g/dL (ref 0.0–0.4)
Alpha-2-Globulin: 1.2 g/dL — ABNORMAL HIGH (ref 0.4–1.0)
Beta Globulin: 0.8 g/dL (ref 0.7–1.3)
Gamma Globulin: 0.6 g/dL (ref 0.4–1.8)
Globulin, Total: 2.9 g/dL (ref 2.2–3.9)
Total Protein ELP: 5.9 g/dL — ABNORMAL LOW (ref 6.0–8.5)

## 2018-09-14 LAB — BASIC METABOLIC PANEL
Anion gap: 12 (ref 5–15)
BUN: 43 mg/dL — ABNORMAL HIGH (ref 8–23)
CO2: 26 mmol/L (ref 22–32)
Calcium: 8.7 mg/dL — ABNORMAL LOW (ref 8.9–10.3)
Chloride: 98 mmol/L (ref 98–111)
Creatinine, Ser: 1.83 mg/dL — ABNORMAL HIGH (ref 0.61–1.24)
GFR calc Af Amer: 38 mL/min — ABNORMAL LOW (ref >60)
GFR calc non Af Amer: 33 mL/min — ABNORMAL LOW (ref >60)
Glucose, Bld: 150 mg/dL — ABNORMAL HIGH (ref 70–99)
Potassium: 3.7 mmol/L (ref 3.5–5.1)
Sodium: 136 mmol/L (ref 135–145)

## 2018-09-14 LAB — GLOMERULAR BASEMENT MEMBRANE ANTIBODIES: GBM Ab: 2 units (ref 0–20)

## 2018-09-14 LAB — ECHOCARDIOGRAM LIMITED
Height: 71 in
Weight: 4070.4 oz

## 2018-09-14 LAB — GLUCOSE, CAPILLARY
Glucose-Capillary: 134 mg/dL — ABNORMAL HIGH (ref 70–99)
Glucose-Capillary: 144 mg/dL — ABNORMAL HIGH (ref 70–99)
Glucose-Capillary: 145 mg/dL — ABNORMAL HIGH (ref 70–99)
Glucose-Capillary: 162 mg/dL — ABNORMAL HIGH (ref 70–99)

## 2018-09-14 MED ORDER — METHYLPREDNISOLONE SODIUM SUCC 40 MG IJ SOLR
40.0000 mg | Freq: Two times a day (BID) | INTRAMUSCULAR | Status: DC
  Administered 2018-09-14 – 2018-09-15 (×2): 40 mg via INTRAVENOUS
  Filled 2018-09-14 (×2): qty 1

## 2018-09-14 MED ORDER — METHYLPREDNISOLONE SODIUM SUCC 125 MG IJ SOLR
60.0000 mg | Freq: Two times a day (BID) | INTRAMUSCULAR | Status: DC
  Administered 2018-09-14: 60 mg via INTRAVENOUS
  Filled 2018-09-14: qty 2

## 2018-09-14 MED ORDER — HYDRALAZINE HCL 20 MG/ML IJ SOLN: 10.0000 mg | INTRAMUSCULAR | Status: DC | PRN

## 2018-09-14 MED ORDER — FUROSEMIDE 10 MG/ML IJ SOLN
60.0000 mg | Freq: Once | INTRAMUSCULAR | Status: AC
  Administered 2018-09-14: 60 mg via INTRAVENOUS
  Filled 2018-09-14: qty 6

## 2018-09-14 MED ORDER — CALCIUM CARBONATE ANTACID 500 MG PO CHEW
1.0000 | CHEWABLE_TABLET | Freq: Three times a day (TID) | ORAL | Status: DC | PRN
  Administered 2018-09-14: 200 mg via ORAL
  Filled 2018-09-14: qty 1

## 2018-09-14 MED ORDER — LORAZEPAM 0.5 MG PO TABS
0.5000 mg | ORAL_TABLET | Freq: Three times a day (TID) | ORAL | Status: DC | PRN
  Administered 2018-09-14 – 2018-09-18 (×5): 0.5 mg via ORAL
  Filled 2018-09-14 (×5): qty 1

## 2018-09-14 MED ORDER — PANTOPRAZOLE SODIUM 40 MG PO TBEC
40.0000 mg | DELAYED_RELEASE_TABLET | Freq: Every day | ORAL | Status: DC
  Administered 2018-09-14 – 2018-09-19 (×5): 40 mg via ORAL
  Filled 2018-09-14 (×5): qty 1

## 2018-09-14 NOTE — Progress Notes (Signed)
*  PRELIMINARY RESULTS* Echocardiogram 2D Echocardiogram has been performed.  Sherrie Sport 09/14/2018, 8:46 AM

## 2018-09-14 NOTE — Telephone Encounter (Signed)
Spoke to pt's son Jenny Reichmann, explained to current clinical situation re: significant concerns for malignancy/planning for biopsy.
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
## 2018-09-14 NOTE — Progress Notes (Signed)
[facility]  ROUNDING NOTE   Subjective:   Productive cough, patient reports hemoptysis.   Objective:  Vital signs in last 24 hours:  Temp:  [97.4 F (36.3 C)-98.1 F (36.7 C)] 98 F (36.7 C) (07/27 0752) Pulse Rate:  [92-96] 93 (07/27 0752) Resp:  [16-20] 18 (07/27 0752) BP: (150-164)/(68-97) 164/97 (07/27 0752) SpO2:  [90 %-100 %] 90 % (07/27 0752) Weight:  [115.4 kg] 115.4 kg (07/27 0339)  Weight change: -0.544 kg Filed Weights   09/12/18 0420 09/13/18 0320 09/14/18 0339  Weight: 112.4 kg 115.9 kg 115.4 kg    Intake/Output: I/O last 3 completed shifts: In: 1128.8 [P.O.:480; I.V.:648.8] Out: 2000 [Urine:2000]   Intake/Output this shift:  Total I/O In: 250 [P.O.:240; I.V.:10] Out: -   Physical Exam: General: NAD, laying in bed  Head: Normocephalic, atraumatic. Moist oral mucosal membranes  Eyes: Anicteric, PERRL  Neck: Supple, trachea midline  Lungs:  +wheezing, + coughing  Heart: Regular rate and rhythm  Abdomen:  Soft, nontender,   Extremities:  trace peripheral edema.  Neurologic: Nonfocal, moving all four extremities  Skin: No lesions        Basic Metabolic Panel: Recent Labs  Lab 09/10/18 1402 09/10/18 1952 09/11/18 0608 09/12/18 0727 09/13/18 1202 09/14/18 0743  NA 130*  --  132* 131* 134* 136  K 6.0* 5.6* 5.7* 4.7 3.9 3.7  CL 97*  --  98 95* 97* 98  CO2 16*  --  20* 23 25 26   GLUCOSE 122*  --  116* 123* 166* 150*  BUN 70*  --  75* 72* 61* 43*  CREATININE 2.84*  --  2.73* 2.51* 1.99* 1.83*  CALCIUM 8.8*  --  8.4* 8.2* 8.4* 8.7*  MG 2.6*  --   --   --   --   --     Liver Function Tests: Recent Labs  Lab 09/10/18 1402 09/12/18 0727  AST 19 18  ALT 14 15  ALKPHOS 71 64  BILITOT 1.0 0.8  PROT 7.7 6.2*  ALBUMIN 3.7 3.0*   No results for input(s): LIPASE, AMYLASE in the last 168 hours. No results for input(s): AMMONIA in the last 168 hours.  CBC: Recent Labs  Lab 09/10/18 1402 09/11/18 0608 09/14/18 0743  WBC  14.3* 11.5* 10.5  NEUTROABS 12.9*  --   --   HGB 13.5 12.1* 13.0  HCT 41.4 36.7* 39.7  MCV 91.8 92.0 92.1  PLT 420* 307 284    Cardiac Enzymes: No results for input(s): CKTOTAL, CKMB, CKMBINDEX, TROPONINI in the last 168 hours.  BNP: Invalid input(s): POCBNP  CBG: Recent Labs  Lab 09/13/18 1136 09/13/18 1658 09/13/18 2107 09/13/18 2108 09/14/18 0751  GLUCAP 174* 128* 172* 144* 134*    Microbiology: Results for orders placed or performed during the hospital encounter of 09/10/18  SARS Coronavirus 2 (CEPHEID - Performed in [place] hospital lab), Hosp Order     Status: None   Collection Time: 09/10/18  2:03 PM   Specimen: Nasopharyngeal Swab  Result Value Ref Range Status   SARS Coronavirus 2 NEGATIVE NEGATIVE Final    Comment: (NOTE) If result is NEGATIVE SARS-CoV-2 target nucleic acids are NOT DETECTED. The SARS-CoV-2 RNA is generally detectable in upper and lower  respiratory specimens during the acute phase of infection. The lowest  concentration of SARS-CoV-2 viral copies this assay can detect is 250  copies / mL. A negative result does not preclude SARS-CoV-2 infection  and should not be used as the sole  basis for treatment or other  patient management decisions.  A negative result may occur with  improper specimen collection / handling, submission of specimen other  than nasopharyngeal swab, presence of viral mutation(s) within the  areas targeted by this assay, and inadequate number of viral copies  (<250 copies / mL). A negative result must be combined with clinical  observations, patient history, and epidemiological information. If result is POSITIVE SARS-CoV-2 target nucleic acids are DETECTED. The SARS-CoV-2 RNA is generally detectable in upper and lower  respiratory specimens dur ing the acute phase of infection.  Positive  results are indicative of active infection with SARS-CoV-2.  Clinical  correlation with patient history and other diagnostic  information is  necessary to determine patient infection status.  Positive results do  not rule out bacterial infection or co-infection with other viruses. If result is PRESUMPTIVE POSTIVE SARS-CoV-2 nucleic acids MAY BE PRESENT.   A presumptive positive result was obtained on the submitted specimen  and confirmed on repeat testing.  While May 08, 2017 novel coronavirus  (SARS-CoV-2) nucleic acids may be present in the submitted sample  additional confirmatory testing may be necessary for epidemiological  and / or clinical management purposes  to differentiate between  SARS-CoV-2 and other Sarbecovirus currently known to infect humans.  If clinically indicated additional testing with an alternate test  methodology [phone]) is advised. The SARS-CoV-2 RNA is generally  detectable in upper and lower respiratory sp ecimens during the acute  phase of infection. The expected result is Negative. Fact Sheet for Patients:  StrictlyIdeas.no Fact Sheet for Healthcare Providers: BankingDealers.co.za This test is not yet approved or cleared by the [place] FDA and has been authorized for detection and/or diagnosis of SARS-CoV-2 by FDA under an Emergency Use Authorization (EUA).  This EUA will remain in effect (meaning this test can be used) for the duration of the COVID-19 declaration under Section 564(b)(1) of the Act, 21 U.S.C. section 360bbb-3(b)(1), unless the authorization is terminated or revoked sooner. Performed at [facility], [address] [place], [place] [address]     Coagulation Studies: Recent Labs    09/13/18 05/08/2001  LABPROT 15.9*  INR 1.3*    Urinalysis: Recent Labs    09/13/18 0515  COLORURINE YELLOW*  LABSPEC 1.016  PHURINE 5.0  GLUCOSEU NEGATIVE  HGBUR NEGATIVE  BILIRUBINUR NEGATIVE  KETONESUR 5*  PROTEINUR NEGATIVE  NITRITE NEGATIVE  LEUKOCYTESUR NEGATIVE      Imaging: Dg Chest Port 1  View  Result Date: 09/13/2018 CLINICAL DATA:  85 year old male with history of shortness of breath today. EXAM: PORTABLE CHEST 1 VIEW COMPARISON:  Chest x-ray 09/10/2018. FINDINGS: Small bilateral pleural effusions. Bibasilar opacities which may reflect areas of atelectasis and/or consolidation. Cephalization of the pulmonary vasculature. Enlargement of the cardiopericardial silhouette. Upper mediastinal contours are within normal limits. Aortic atherosclerosis. Left-sided pacemaker with lead tips projecting over the expected location of the right atrium and right ventricle. IMPRESSION: 1. Enlargement of the cardiopericardial silhouette related to known pericardial effusion (better demonstrated on recent chest CT). 2. Small bilateral pleural effusions with bibasilar opacities favored to reflect areas of atelectasis and/or consolidation. 3. Pulmonary venous congestion, without frank pulmonary edema. Electronically Signed   By: Vinnie Langton M.D.   On: 09/13/2018 09:25     Medications:    . docusate sodium  100 mg Oral BID  . feeding supplement (NEPRO CARB STEADY)  237 mL Oral BID BM  . heparin  5,000 Units Subcutaneous Q8H  . insulin aspart  0-9 Units Subcutaneous TID WC  . multivitamin with minerals  1 tablet Oral Daily  . sodium chloride flush  10 mL Intravenous Q12H   acetaminophen **OR** acetaminophen, bisacodyl, guaiFENesin-dextromethorphan, HYDROcodone-acetaminophen, ipratropium-albuterol, ondansetron **OR** ondansetron (ZOFRAN) IV, traZODone  Assessment/ Plan:  Mr. Javier Wong is a 85 y.o. white male withhypertension, hyperlipidemia, history of complete heart block status post dual chamber pacemaker placement, diabetes mellitus type II, gout, who was admitted to [facility] on[facility]/23/2020. Found to have right lung mass.   1. Acute renal failure with hyperkalemia and metabolic acidosis: on chronic kidney disease stage III with proteinuria:  baseline creatinine 1.5, EGFR 45 on  05/20/2018. Chronic kidney disease secondary to NSAIDs and diabetes Acute renal failure secondary to acute cardiorenal syndrome.  - Discontinued naproxen on admission.  - holding ramipril, hydrochlorothiazide and furosemide.  - Discontinue lokelma  2. Pericardial effusion: no acute tamponade physiology. No evidence of uremia  3. Hypertension: elevated 164/97. Home regimen of ramipril, hydrochlorothiazide and furosemide. Holding due to acute renal failure - Start PRN IV hydralazine.  - Low threshold to start furosemide IV  4. Right lower lobe lung mass.  - work up as per pulmonology and heme/onc   LOS: [place] 7/27/202011:13 AM

## 2018-09-14 NOTE — Progress Notes (Signed)
Pulmonary Medicine          Date: 09/14/2018,   MRN# [id number] KASEEM VASTINE [birth year]     AdmissionWeight: 108.9 kg                 CurrentWeight: 115.4 kg        SUBJECTIVE   Reports mild improvement in symptoms.   Patient with bilateral pleural effusions, CHF with pericardial effusion and new RLL lung mass with subcarinal extension.   Worsening SOB and crackles on auscultation overnight.  He was started on IV fluids yesterday 50cc/hr, I will stop this for now.  Patient is on lasix at home.   S/p hospitalist, cardiology, oncology, nephrology evaluation - appreciate collaboration, plan for PET with possible mediastinal staging post cardiac clearance and pulmonary preoperative evaluation.  PAST MEDICAL HISTORY   Past Medical History:  Diagnosis Date  . Chronic kidney disease   . Diabetes type 2, controlled ([place])   . Hypertension      SURGICAL HISTORY   Patient denies previous surgery   FAMILY HISTORY   Family History  Problem Relation Age of Onset  . Mesothelioma Father      SOCIAL HISTORY   Social History   Tobacco Use  . Smoking status: Never Smoker  Substance Use Topics  . Alcohol use: Never    Frequency: Never  . Drug use: Never     MEDICATIONS    Home Medication:    Current Medication:  Current Facility-Administered Medications:  .  acetaminophen (TYLENOL) tablet 650 mg, 650 mg, Oral, Q6H PRN **OR** acetaminophen (TYLENOL) suppository 650 mg, 650 mg, Rectal, Q6H PRN, Epifanio Lesches, MD .  bisacodyl (DULCOLAX) EC tablet 5 mg, 5 mg, Oral, Daily PRN, Epifanio Lesches, MD .  calcium carbonate (TUMS - dosed in mg elemental calcium) chewable tablet 200 mg of elemental calcium, 1 tablet, Oral, TID PRN, Sudini, Srikar, MD, 200 mg of elemental calcium at 09/14/18 1309 .  docusate sodium (COLACE) capsule 100 mg, 100 mg, Oral, BID, Vianne Bulls, Snehalatha, MD .  feeding supplement (NEPRO CARB STEADY) liquid 237 mL, 237 mL,  Oral, BID BM, Mayo, Pete Pelt, MD, 237 mL at 09/14/18 0824 .  guaiFENesin-dextromethorphan (ROBITUSSIN DM) 100-10 MG/5ML syrup 5 mL, 5 mL, Oral, Q4H PRN, Epifanio Lesches, MD, 5 mL at 09/13/18 1802 .  heparin injection 5,000 Units, 5,000 Units, Subcutaneous, Q8H, Epifanio Lesches, MD, 5,000 Units at 09/14/18 1258 .  hydrALAZINE (APRESOLINE) injection 10 mg, 10 mg, Intravenous, Q4H PRN, Kolluru, Sarath, MD .  HYDROcodone-acetaminophen (NORCO/VICODIN) 5-325 MG per tablet 1 tablet, 1 tablet, Oral, Q4H PRN, Epifanio Lesches, MD .  insulin aspart (novoLOG) injection 0-9 Units, 0-9 Units, Subcutaneous, TID WC, Epifanio Lesches, MD, 1 Units at 09/14/18 1251 .  ipratropium-albuterol (DUONEB) 0.5-2.5 (3) MG/3ML nebulizer solution 3 mL, 3 mL, Nebulization, Q4H PRN, Lance Coon, MD, 3 mL at 09/14/18 0750 .  LORazepam (ATIVAN) tablet 0.5 mg, 0.5 mg, Oral, TID PRN, Hillary Bow, MD, 0.5 mg at 09/14/18 1309 .  methylPREDNISolone sodium succinate (SOLU-MEDROL) 125 mg/2 mL injection 60 mg, 60 mg, Intravenous, BID, Sudini, Srikar, MD, 60 mg at 09/14/18 1251 .  multivitamin with minerals tablet 1 tablet, 1 tablet, Oral, Daily, Mayo, Pete Pelt, MD, 1 tablet at 09/14/18 [id number] .  ondansetron (ZOFRAN) tablet 4 mg, 4 mg, Oral, Q6H PRN **OR** ondansetron (ZOFRAN) injection 4 mg, 4 mg, Intravenous, Q6H PRN, Epifanio Lesches, MD .  pantoprazole (PROTONIX) EC tablet 40 mg, 40 mg, Oral, Daily, Sudini, Artist,  MD, 40 mg at 09/14/18 1312 .  sodium chloride flush (NS) 0.9 % injection 10 mL, 10 mL, Intravenous, Q12H, Epifanio Lesches, MD, 10 mL at 09/14/18 4403    ALLERGIES   Patient has no known allergies.     REVIEW OF SYSTEMS    Review of Systems:  Gen:  Denies  fever, sweats, chills weigh loss  HEENT: Denies blurred vision, double vision, ear pain, eye pain, hearing loss, nose bleeds, sore throat Cardiac:  No dizziness, chest pain or heaviness, chest tightness,edema Resp:   Denies cough  or sputum porduction, shortness of breath,wheezing, hemoptysis,  Gi: Denies swallowing difficulty, stomach pain, nausea or vomiting, diarrhea, constipation, bowel incontinence Gu:  Denies bladder incontinence, burning urine Ext:   Denies Joint pain, stiffness or swelling Skin: Denies  skin rash, easy bruising or bleeding or hives Endoc:  Denies polyuria, polydipsia , polyphagia or weight change Psych:   Denies depression, insomnia or hallucinations   Other:  All other systems negative   VS: BP (!) 158/93   Pulse 90   Temp 98 F (36.7 C)   Resp 18   Ht 5\' 11"  (1.803 m)   Wt 115.4 kg   SpO2 90%   BMI 35.48 kg/m      PHYSICAL EXAM    GENERAL:NAD, no fevers, chills, no weakness no fatigue HEAD: Normocephalic, atraumatic.  EYES: Pupils equal, round, reactive to light. Extraocular muscles intact. No scleral icterus.  MOUTH: Moist mucosal membrane. Dentition intact. No abscess noted.  EAR, NOSE, THROAT: Clear without exudates. No external lesions.  NECK: Supple. No thyromegaly. No nodules. No JVD.  PULMONARY: Bibasilar crackles without wheezing or rhonchorous breath sounds CARDIOVASCULAR: S1 and S2. Regular rate and rhythm. No murmurs, rubs, or gallops. No edema. Pedal pulses 2+ bilaterally.  GASTROINTESTINAL: Soft, nontender, nondistended. No masses. Positive bowel sounds. No hepatosplenomegaly.  MUSCULOSKELETAL: No swelling, clubbing, or edema. Range of motion full in all extremities.  NEUROLOGIC: Cranial nerves II through XII are intact. No gross focal neurological deficits. Sensation intact. Reflexes intact.  SKIN: No ulceration, lesions, rashes, or cyanosis. Skin warm and dry. Turgor intact.  PSYCHIATRIC: Mood, affect within normal limits. The patient is awake, alert and oriented x 3. Insight, judgment intact.       IMAGING    Ct Chest Wo Contrast  Result Date: 09/11/2018 CLINICAL DATA:  Increasing weakness, fatigue and nausea. EXAM: CT CHEST WITHOUT CONTRAST TECHNIQUE:  Multidetector CT imaging of the chest was performed following the standard protocol without IV contrast. COMPARISON:  Chest x-ray 09/10/2018 FINDINGS: Cardiovascular: The heart is normal in size for the patient's age. There is a moderate to large pericardial effusion. This appears to be simple fluid. The pacer wires are in good position without complicating features. There is mild tortuosity, ectasia and moderate atherosclerotic calcifications involving the thoracic aorta. Three-vessel coronary artery calcifications are noted. Mediastinum/Nodes: There is a very large subcarinal mass measuring at least 7.7 x 4.9 x 5.1 cm. It appears to be contiguous with a right lower lobe soft tissue mass in the as ago esophageal recess. This also continues down to the diaphragmatic crus and there is adjacent probable nodal disease just above the celiac axis. A few other small scattered upper mediastinal lymph nodes. I do not see any definite direct involvement of the esophagus. There is some high attenuation material the esophagus which could be debris or pills. Lungs/Pleura: Suspect right lower lobe mass in the as ago esophageal recess contiguous with the large mediastinal mass or adenopathy.  Moderate bilateral pleural effusions along with fluid in both major fissures. There is an ill-defined 16 mm lesion in the right middle lobe which is indeterminate. Moderate vascular congestion and probable mild perihilar pulmonary edema. Upper Abdomen: Advanced cirrhotic changes involving the liver with a very irregular liver contour, dilated hepatic fissures and increased caudate to right lobe ratio. No obvious hepatic lesions without contrast. No splenomegaly. 2 cm nodal lesion noted just above the celiac axis and adjacent to the caudate lobe of the liver. Musculoskeletal: Indeterminate subcutaneous lesion involving the posterior lower thorax. Smaller adjacent lesion is also indeterminate. These could be benign complex sebaceous cysts. Mild  symmetric bilateral gynecomastia. Left-sided permanent pacemaker without complicating features. Advanced degenerative changes involving the spine but no worrisome bone lesions. IMPRESSION: 1. 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 PET-CT or biopsy may be helpful for further evaluation. 2. Moderate-sized bilateral pleural effusions. 3. Moderate to large pericardial effusion. 4. Indeterminate 16 mm nodule in the right middle lobe, possibly a metastatic focus. 5. Advanced cirrhotic changes involving the liver without definite hepatic lesions. Aortic Atherosclerosis (ICD10-I70.0). Electronically Signed   By: Marijo Sanes M.D.   On: 09/11/2018 13:51   US Renal  Result Date: 09/11/2018 CLINICAL DATA:  Acute kidney injury EXAM: RENAL / URINARY TRACT ULTRASOUND COMPLETE COMPARISON:  None. FINDINGS: Right Kidney: Renal measurements: 11.0 x 5.4 x 5.1 cm = volume: 159 mL. Slightly increased echotexture diffusely. Cortical thinning. No mass or hydronephrosis Left Kidney: Renal measurements: 11.2 x 5.9 x 5.7 cm = volume: 200 mL. Slightly increased echotexture diffusely with cortical thinning. No mass or hydronephrosis. Bladder: Appears normal for degree of bladder distention. Small amount of ascites seen within the abdomen. IMPRESSION: Increased echotexture and cortical thinning compatible with chronic medical renal disease. No acute findings. No hydronephrosis. Small amount of ascites incidentally noted. Electronically Signed   By: Rolm Baptise M.D.   On: 09/11/2018 10:52   Dg Chest Port 1 View  Result Date: 09/13/2018 CLINICAL DATA:  85 year old male with history of shortness of breath today. EXAM: PORTABLE CHEST 1 VIEW COMPARISON:  Chest x-ray 09/10/2018. FINDINGS: Small bilateral pleural effusions. Bibasilar opacities which may reflect areas of atelectasis and/or consolidation.  Cephalization of the pulmonary vasculature. Enlargement of the cardiopericardial silhouette. Upper mediastinal contours are within normal limits. Aortic atherosclerosis. Left-sided pacemaker with lead tips projecting over the expected location of the right atrium and right ventricle. IMPRESSION: 1. Enlargement of the cardiopericardial silhouette related to known pericardial effusion (better demonstrated on recent chest CT). 2. Small bilateral pleural effusions with bibasilar opacities favored to reflect areas of atelectasis and/or consolidation. 3. Pulmonary venous congestion, without frank pulmonary edema. Electronically Signed   By: Vinnie Langton M.D.   On: 09/13/2018 09:25   Dg Chest Portable 1 View  Result Date: 09/10/2018 CLINICAL DATA:  Shortness of breath. EXAM: PORTABLE CHEST 1 VIEW COMPARISON:  Radiograph of April 06, 2010. FINDINGS: Stable cardiomegaly. Left-sided pacemaker is unchanged in position. No pneumothorax is noted. No significant pleural effusion is noted. Minimal bibasilar subsegmental atelectasis is noted. Degenerative changes are seen involving both glenohumeral joints. IMPRESSION: Minimal bibasilar subsegmental atelectasis. Electronically Signed   By: Marijo Conception M.D.   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    -appreciate everyone involved in this patients care     Thank you for allowing me to participate in the care of this patient.   Patient/Family are satisfied with care plan and all questions have been answered.  This document was prepared using Dragon voice recognition software and may include unintentional dictation errors.     Ottie Glazier, M.D.  Division of [place]

## 2018-09-14 NOTE — Care Management Important Message (Signed)
Important Message  Patient Details  Name: Javier Wong MRN: [id number] Date of Birth: 07-17-1932   Medicare Important Message Given:  Yes     Dannette Barbara 09/14/2018, 12:05 PM

## 2018-09-14 NOTE — Progress Notes (Signed)
[facility] [place] NAME: Aadyn Buchheit    MR#:  [id number]  DATE OF BIRTH:  1932-12-08  SUBJECTIVE:   Continues to have shortness of breath.  Anxious.  Not on oxygen.  Afebrile.  No chest pain.  REVIEW OF SYSTEMS:  Review of Systems  Constitutional: Negative for chills, fever and malaise/fatigue.  HENT: Negative for congestion and sore throat.   Eyes: Negative for blurred vision and double vision.  Respiratory: Positive for shortness of breath. Negative for cough.   Cardiovascular: Negative for chest pain and palpitations.  Gastrointestinal: Negative for abdominal pain, nausea and vomiting.  Genitourinary: Negative for dysuria and urgency.  Musculoskeletal: Negative for back pain and neck pain.  Neurological: Positive for weakness. Negative for dizziness, focal weakness and headaches.  Psychiatric/Behavioral: Negative for depression. The patient is not nervous/anxious.     DRUG ALLERGIES:  No Known Allergies VITALS:  Blood pressure (!) 158/93, pulse 90, temperature 98 F (36.7 C), resp. rate 18, height 5\' 11"  (1.803 m), weight 115.4 kg, SpO2 90 %. PHYSICAL EXAMINATION:  Physical Exam  GENERAL:   Denies any complaints. HEENT: Head atraumatic, normoceph and accommodation. No scleral icterus. Extraocular muscles intact. Oropharynx and nasopharynx clear.  NECK:  Supple, no jugular venous distention. No thyroid enlargement. LUNGS -bilateral crackles and wheezing CARDIOVASCULAR: Bradycardia resolved, heart rate 62 bpm. ABDOMEN: Soft, nontender, nondistended. Bowel sounds present.  EXTREMITIES: No pedal edema, cyanosis, or clubbing.  NEUROLOGIC: CN 2-12 intact, no focal deficits. +global weakness. Sensation intact throughout. Gait not checked.  PSYCHIATRIC: The patient is alert and oriented x 3.  Anxious SKIN: No obvious rash, lesion, or ulcer.  LABORATORY PANEL:  Male CBC Recent Labs  Lab 09/14/18 0743  WBC 10.5  HGB 13.0   HCT 39.7  PLT 284   ------------------------------------------------------------------------------------------------------------------ Chemistries  Recent Labs  Lab 09/10/18 1402  09/12/18 0727  09/14/18 0743  NA 130*   < > 131*   < > 136  K 6.0*   < > 4.7   < > 3.7  CL 97*   < > 95*   < > 98  CO2 16*   < > 23   < > 26  GLUCOSE 122*   < > 123*   < > 150*  BUN 70*   < > 72*   < > 43*  CREATININE 2.84*   < > 2.51*   < > 1.83*  CALCIUM 8.8*   < > 8.2*   < > 8.7*  MG 2.6*  --   --   --   --   AST 19  --  18  --   --   ALT 14  --  15  --   --   ALKPHOS 71  --  64  --   --   BILITOT 1.0  --  0.8  --   --    < > = values in this interval not displayed.   RADIOLOGY:  No results found. ASSESSMENT AND PLAN:   * Moderate pericardial effusion- seen on echo.  No evidence of tamponade physiology.,  Repeat echocardiogram with similar pericardial effusion.  Appreciate cardiology input  *Acute on chronic diastolic congestive heart failure We will give stat dose of IV Lasix.  *Right lower lobe lung mass with subcarinal extension Seen by pulmonary and oncology.  Scheduled for PET scan tomorrow.  * Acute kidney injury- likely secondary to NSAID use and poor cardiac output.  Renal function  is slowly improving, creatinine down from 2.84-->1.83 -Renal ultrasound showed medical renal disease, but no acute abnormalities -Nephrology consulted and appreciate input.  Discussed with Dr. Juleen China. -Avoid nephrotoxic agents, IV fluids discontinued because of concern for CHF.  Chest x-ray showed pulmonary vascular congestion and also had more crackles on physical exam.  *Hyperkalemia secondary to acute kidney injury.  Improved.  *Atrial flutter found on EKG.  Cardiology following.  Has pacemaker in place.  Not started on anticoagulation due to patient awaiting further treatment plan for pericardial effusion.  * Deconditioning- due to above -PT consult  * Type 2 diabetes -Continue SSI  All the  records are reviewed and case discussed with Care Management/Social Worker. Management plans discussed with the patient, family and they are in agreement.  CODE STATUS: Full Code  TOTAL TIME TAKING CARE OF THIS PATIENT: 35 minutes.   POSSIBLE D/C IN 3-4 DAYS, DEPENDING ON CLINICAL CONDITION.  Leia Alf Payam Gribble M.D on 09/14/2018 at 2:38 PM  Between 7am to 6pm - Pager - [phone]  After 6pm go to [email or web address] - Proofreader  [facility]  Office  [phone]  CC: Primary care physician; Glendon Axe, MD  Note: This dictation was prepared with Dragon dictation along with smaller phrase technology. Any transcriptional errors that result from this process are unintentional.

## 2018-09-14 NOTE — Progress Notes (Signed)
Pericardial effusion appears to be similar to last echo. No evidence of tamponade physiology. Appreciate nephrology and oncology assistence with workup. PET scan pending. Renal function improved somewhat at 1.83.  Will continue to follow. Appreciate Dr. Genevive Bi input.

## 2018-09-14 NOTE — Plan of Care (Signed)
  Problem: Activity: Goal: Activity intolerance will improve Outcome: Progressing   Problem: Respiratory: Goal: Respiratory symptoms related to disease process will be avoided Outcome: Not Progressing Note: Dyspneic on exertion. Patient has audible wheezes.    Problem: Clinical Measurements: Goal: Dialysis access will remain free of complications Outcome: Not Applicable

## 2018-09-14 NOTE — Progress Notes (Signed)
Javier Wong   DOB:10-10-1932   DX#:[id number]    Subjective: Denies any chest pain.  Continues to have shortness of breath especially exertion.  No nausea no vomiting.  Objective:  Vitals:   09/14/18 1240 09/14/18 1641  BP: (!) 158/93 (!) 184/107  Pulse: 90 96  Resp:  20  Temp:  97.8 F (36.6 C)  SpO2:  90%     Intake/Output Summary (Last 24 hours) at 09/14/2018 1726 Last data filed at 09/14/2018 1518 Gross per 24 hour  Intake 490 ml  Output 1500 ml  Net -1010 ml    Physical Exam  Constitutional: He is oriented to person, place, and time and well-developed, well-nourished, and in no distress.  Obese male patient.  Resting in the bed.  HENT:  Head: Normocephalic and atraumatic.  Mouth/Throat: Oropharynx is clear and moist. No oropharyngeal exudate.  Eyes: Pupils are equal, round, and reactive to light.  Neck: Normal range of motion. Neck supple.  Cardiovascular: Normal rate and regular rhythm.  Pulmonary/Chest: No respiratory distress. He has no wheezes.  Decreased air entry bilaterally.  Abdominal: Soft. Bowel sounds are normal. He exhibits no distension and no mass. There is no abdominal tenderness. There is no rebound and no guarding.  Musculoskeletal: Normal range of motion.        General: No tenderness or edema.  Neurological: He is alert and oriented to person, place, and time.  Skin: Skin is warm.  Psychiatric: Affect normal.     Labs:  Lab Results  Component Value Date   WBC 10.5 09/14/2018   HGB 13.0 09/14/2018   HCT 39.7 09/14/2018   MCV 92.1 09/14/2018   PLT 284 09/14/2018   NEUTROABS 12.9 (H) 09/10/2018    Lab Results  Component Value Date   NA 136 09/14/2018   K 3.7 09/14/2018   CL 98 09/14/2018   CO2 26 09/14/2018    Studies:  Dg Chest Port 1 View  Result Date: 09/13/2018 CLINICAL DATA:  85 year old male with history of shortness of breath today. EXAM: PORTABLE CHEST 1 VIEW COMPARISON:  Chest x-ray 09/10/2018. FINDINGS: Small bilateral  pleural effusions. Bibasilar opacities which may reflect areas of atelectasis and/or consolidation. Cephalization of the pulmonary vasculature. Enlargement of the cardiopericardial silhouette. Upper mediastinal contours are within normal limits. Aortic atherosclerosis. Left-sided pacemaker with lead tips projecting over the expected location of the right atrium and right ventricle. IMPRESSION: 1. Enlargement of the cardiopericardial silhouette related to known pericardial effusion (better demonstrated on recent chest CT). 2. Small bilateral pleural effusions with bibasilar opacities favored to reflect areas of atelectasis and/or consolidation. 3. Pulmonary venous congestion, without frank pulmonary edema. Electronically Signed   By: Vinnie Langton M.D.   On: 09/13/2018 09:25    Mass of lower lobe of right lung 85 year old male patient non-smoker/mild CKD is currently admitted to hospital for worsening insufficiency/right lower lobe lung mass/bulky mediastinal adenopathy  #Right lower lobe lung mass/bulky mediastinal adenopathy/pericardial effusion-highly suspicious for malignant process.  Discussed with Dr. Posey Pronto from radiology who kindly approves inpatient PET scan-for biopsy planning.  #Chronic kidney disease/acute renal failure-Baseline creatinine 1.5.  Improving creatinine 1.8.  Cammie Sickle, MD 09/14/2018  5:26 PM

## 2018-09-15 ENCOUNTER — Inpatient Hospital Stay: Payer: Medicare Other

## 2018-09-15 ENCOUNTER — Encounter: Payer: Self-pay | Admitting: Radiology

## 2018-09-15 LAB — KAPPA/LAMBDA LIGHT CHAINS
Kappa free light chain: 25 mg/L — ABNORMAL HIGH (ref 3.3–19.4)
Kappa, lambda light chain ratio: 1.3 (ref 0.26–1.65)
Lambda free light chains: 19.3 mg/L (ref 5.7–26.3)

## 2018-09-15 LAB — BASIC METABOLIC PANEL
Anion gap: 12 (ref 5–15)
BUN: 38 mg/dL — ABNORMAL HIGH (ref 8–23)
CO2: 29 mmol/L (ref 22–32)
Calcium: 8.9 mg/dL (ref 8.9–10.3)
Chloride: 97 mmol/L — ABNORMAL LOW (ref 98–111)
Creatinine, Ser: 1.64 mg/dL — ABNORMAL HIGH (ref 0.61–1.24)
GFR calc Af Amer: 44 mL/min — ABNORMAL LOW (ref >60)
GFR calc non Af Amer: 38 mL/min — ABNORMAL LOW (ref >60)
Glucose, Bld: 160 mg/dL — ABNORMAL HIGH (ref 70–99)
Potassium: 3.7 mmol/L (ref 3.5–5.1)
Sodium: 138 mmol/L (ref 135–145)

## 2018-09-15 LAB — GLUCOSE, CAPILLARY
Glucose-Capillary: 215 mg/dL — ABNORMAL HIGH (ref 70–99)
Glucose-Capillary: 169 mg/dL — ABNORMAL HIGH (ref 70–99)

## 2018-09-15 LAB — MPO/PR-3 (ANCA) ANTIBODIES
ANCA Proteinase 3: <3.5 U/mL (ref 0.0–3.5)
Myeloperoxidase Abs: <9 U/mL (ref 0.0–9.0)

## 2018-09-15 LAB — PROTEIN ELECTRO, RANDOM URINE
Albumin ELP, Urine: 31.9 %
Alpha-1-Globulin, U: 2.5 %
Alpha-2-Globulin, U: 13.7 %
Beta Globulin, U: 27.3 %
Gamma Globulin, U: 24.7 %
Total Protein, Urine: 12.1 mg/dL

## 2018-09-15 MED ORDER — FLUDEOXYGLUCOSE F - 18 (FDG) INJECTION
14.0400 | Freq: Once | INTRAVENOUS | Status: AC | PRN
  Administered 2018-09-15: 14.04 via INTRAVENOUS

## 2018-09-15 MED ORDER — FUROSEMIDE 10 MG/ML IJ SOLN
60.0000 mg | Freq: Once | INTRAMUSCULAR | Status: AC
  Administered 2018-09-15: 60 mg via INTRAVENOUS
  Filled 2018-09-15: qty 6

## 2018-09-15 MED ORDER — PREDNISONE 20 MG PO TABS
40.0000 mg | ORAL_TABLET | Freq: Every day | ORAL | Status: DC
  Administered 2018-09-16 – 2018-09-19 (×2): 40 mg via ORAL
  Filled 2018-09-15 (×2): qty 2

## 2018-09-15 MED ORDER — ENSURE ENLIVE PO LIQD
237.0000 mL | Freq: Two times a day (BID) | ORAL | Status: DC
  Administered 2018-09-15 – 2018-09-19 (×3): 237 mL via ORAL

## 2018-09-15 NOTE — Progress Notes (Signed)
Pulmonary Medicine          Date: 09/15/2018,   MRN# [id number] Javier Wong [birth year]     AdmissionWeight: 108.9 kg                 CurrentWeight: 113.7 kg        SUBJECTIVE   Patient evaluated at bedside, reports less SOB.  He is s/p PET today. Will review and discuss with oncology regarding next steps.   Making adequate urine post diuresis.   Will decrease steroids from Solumedrol to pred 40 today with plan to taper over 3 days.   S/p hospitalist, cardiology, oncology, nephrology evaluation - appreciate collaboration  -ongoing plan for lung mass biopsy  PAST MEDICAL HISTORY   Past Medical History:  Diagnosis Date  . Chronic kidney disease   . Diabetes type 2, controlled ([place])   . Hypertension      SURGICAL HISTORY   Patient denies previous surgery   FAMILY HISTORY   Family History  Problem Relation Age of Onset  . Mesothelioma Father      SOCIAL HISTORY   Social History   Tobacco Use  . Smoking status: Never Smoker  . Smokeless tobacco: Never Used  Substance Use Topics  . Alcohol use: Never    Frequency: Never  . Drug use: Never     MEDICATIONS    Home Medication:    Current Medication:  Current Facility-Administered Medications:  .  acetaminophen (TYLENOL) tablet 650 mg, 650 mg, Oral, Q6H PRN **OR** acetaminophen (TYLENOL) suppository 650 mg, 650 mg, Rectal, Q6H PRN, Epifanio Lesches, MD .  bisacodyl (DULCOLAX) EC tablet 5 mg, 5 mg, Oral, Daily PRN, Epifanio Lesches, MD .  calcium carbonate (TUMS - dosed in mg elemental calcium) chewable tablet 200 mg of elemental calcium, 1 tablet, Oral, TID PRN, Sudini, Srikar, MD, 200 mg of elemental calcium at 09/14/18 1309 .  docusate sodium (COLACE) capsule 100 mg, 100 mg, Oral, BID, Vianne Bulls, Snehalatha, MD .  feeding supplement (NEPRO CARB STEADY) liquid 237 mL, 237 mL, Oral, BID BM, Mayo, Pete Pelt, MD, 237 mL at 09/14/18 0824 .  guaiFENesin-dextromethorphan  (ROBITUSSIN DM) 100-10 MG/5ML syrup 5 mL, 5 mL, Oral, Q4H PRN, Epifanio Lesches, MD, 5 mL at 09/13/18 1802 .  heparin injection 5,000 Units, 5,000 Units, Subcutaneous, Q8H, Epifanio Lesches, MD, 5,000 Units at 09/15/18 1227 .  hydrALAZINE (APRESOLINE) injection 10 mg, 10 mg, Intravenous, Q4H PRN, Kolluru, Sarath, MD .  HYDROcodone-acetaminophen (NORCO/VICODIN) 5-325 MG per tablet 1 tablet, 1 tablet, Oral, Q4H PRN, Epifanio Lesches, MD .  insulin aspart (novoLOG) injection 0-9 Units, 0-9 Units, Subcutaneous, TID WC, Epifanio Lesches, MD, 1 Units at 09/14/18 1728 .  ipratropium-albuterol (DUONEB) 0.5-2.5 (3) MG/3ML nebulizer solution 3 mL, 3 mL, Nebulization, Q4H PRN, Lance Coon, MD, 3 mL at 09/14/18 0750 .  LORazepam (ATIVAN) tablet 0.5 mg, 0.5 mg, Oral, TID PRN, Hillary Bow, MD, 0.5 mg at 09/14/18 1309 .  methylPREDNISolone sodium succinate (SOLU-MEDROL) 40 mg/mL injection 40 mg, 40 mg, Intravenous, BID, Lanney Gins, Lamount Bankson, MD, 40 mg at 09/15/18 0910 .  multivitamin with minerals tablet 1 tablet, 1 tablet, Oral, Daily, Mayo, Pete Pelt, MD, 1 tablet at 09/15/18 1227 .  ondansetron (ZOFRAN) tablet 4 mg, 4 mg, Oral, Q6H PRN **OR** ondansetron (ZOFRAN) injection 4 mg, 4 mg, Intravenous, Q6H PRN, Epifanio Lesches, MD .  pantoprazole (PROTONIX) EC tablet 40 mg, 40 mg, Oral, Daily, Sudini, Srikar, MD, 40 mg at 09/15/18 1226 .  sodium chloride  flush (NS) 0.9 % injection 10 mL, 10 mL, Intravenous, Q12H, Epifanio Lesches, MD, 10 mL at 09/15/18 0910    ALLERGIES   Patient has no known allergies.     REVIEW OF SYSTEMS    Review of Systems:  Gen:  Denies  fever, sweats, chills weigh loss  HEENT: Denies blurred vision, double vision, ear pain, eye pain, hearing loss, nose bleeds, sore throat Cardiac:  No dizziness, chest pain or heaviness, chest tightness,edema Resp:   Denies cough or sputum porduction, shortness of breath,wheezing, hemoptysis,  Gi: Denies swallowing  difficulty, stomach pain, nausea or vomiting, diarrhea, constipation, bowel incontinence Gu:  Denies bladder incontinence, burning urine Ext:   Denies Joint pain, stiffness or swelling Skin: Denies  skin rash, easy bruising or bleeding or hives Endoc:  Denies polyuria, polydipsia , polyphagia or weight change Psych:   Denies depression, insomnia or hallucinations   Other:  All other systems negative   VS: BP (!) 154/83 (BP Location: Left Arm)   Pulse 72   Temp (!) 97.5 F (36.4 C) (Oral)   Resp (!) 24   Ht 5\' 11"  (1.803 m)   Wt 113.7 kg   SpO2 97%   BMI 34.97 kg/m      PHYSICAL EXAM    GENERAL:NAD, no fevers, chills, no weakness no fatigue HEAD: Normocephalic, atraumatic.  EYES: Pupils equal, round, reactive to light. Extraocular muscles intact. No scleral icterus.  MOUTH: Moist mucosal membrane. Dentition intact. No abscess noted.  EAR, NOSE, THROAT: Clear without exudates. No external lesions.  NECK: Supple. No thyromegaly. No nodules. No JVD.  PULMONARY: mild crackles worse at bases bilaterally.  CARDIOVASCULAR: S1 and S2. Regular rate and rhythm. No murmurs, rubs, or gallops. No edema. Pedal pulses 2+ bilaterally.  GASTROINTESTINAL: Soft, nontender, nondistended. No masses. Positive bowel sounds. No hepatosplenomegaly.  MUSCULOSKELETAL: No swelling, clubbing, or edema. Range of motion full in all extremities.  NEUROLOGIC: Cranial nerves II through XII are intact. No gross focal neurological deficits. Sensation intact. Reflexes intact.  SKIN: No ulceration, lesions, rashes, or cyanosis. Skin warm and dry. Turgor intact.  PSYCHIATRIC: Mood, affect within normal limits. The patient is awake, alert and oriented x 3. Insight, judgment intact.       IMAGING    Ct Chest Wo Contrast  Result Date: 09/11/2018 CLINICAL DATA:  Increasing weakness, fatigue and nausea. EXAM: CT CHEST WITHOUT CONTRAST TECHNIQUE: Multidetector CT imaging of the chest was performed following the  standard protocol without IV contrast. COMPARISON:  Chest x-ray 09/10/2018 FINDINGS: Cardiovascular: The heart is normal in size for the patient's age. There is a moderate to large pericardial effusion. This appears to be simple fluid. The pacer wires are in good position without complicating features. There is mild tortuosity, ectasia and moderate atherosclerotic calcifications involving the thoracic aorta. Three-vessel coronary artery calcifications are noted. Mediastinum/Nodes: There is a very large subcarinal mass measuring at least 7.7 x 4.9 x 5.1 cm. It appears to be contiguous with a right lower lobe soft tissue mass in the as ago esophageal recess. This also continues down to the diaphragmatic crus and there is adjacent probable nodal disease just above the celiac axis. A few other small scattered upper mediastinal lymph nodes. I do not see any definite direct involvement of the esophagus. There is some high attenuation material the esophagus which could be debris or pills. Lungs/Pleura: Suspect right lower lobe mass in the as ago esophageal recess contiguous with the large mediastinal mass or adenopathy. Moderate bilateral pleural effusions  along with fluid in both major fissures. There is an ill-defined 16 mm lesion in the right middle lobe which is indeterminate. Moderate vascular congestion and probable mild perihilar pulmonary edema. Upper Abdomen: Advanced cirrhotic changes involving the liver with a very irregular liver contour, dilated hepatic fissures and increased caudate to right lobe ratio. No obvious hepatic lesions without contrast. No splenomegaly. 2 cm nodal lesion noted just above the celiac axis and adjacent to the caudate lobe of the liver. Musculoskeletal: Indeterminate subcutaneous lesion involving the posterior lower thorax. Smaller adjacent lesion is also indeterminate. These could be benign complex sebaceous cysts. Mild symmetric bilateral gynecomastia. Left-sided permanent pacemaker  without complicating features. Advanced degenerative changes involving the spine but no worrisome bone lesions. IMPRESSION: 1. 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 PET-CT or biopsy may be helpful for further evaluation. 2. Moderate-sized bilateral pleural effusions. 3. Moderate to large pericardial effusion. 4. Indeterminate 16 mm nodule in the right middle lobe, possibly a metastatic focus. 5. Advanced cirrhotic changes involving the liver without definite hepatic lesions. Aortic Atherosclerosis (ICD10-I70.0). Electronically Signed   By: Marijo Sanes M.D.   On: 09/11/2018 13:51   US Renal  Result Date: 09/11/2018 CLINICAL DATA:  Acute kidney injury EXAM: RENAL / URINARY TRACT ULTRASOUND COMPLETE COMPARISON:  None. FINDINGS: Right Kidney: Renal measurements: 11.0 x 5.4 x 5.1 cm = volume: 159 mL. Slightly increased echotexture diffusely. Cortical thinning. No mass or hydronephrosis Left Kidney: Renal measurements: 11.2 x 5.9 x 5.7 cm = volume: 200 mL. Slightly increased echotexture diffusely with cortical thinning. No mass or hydronephrosis. Bladder: Appears normal for degree of bladder distention. Small amount of ascites seen within the abdomen. IMPRESSION: Increased echotexture and cortical thinning compatible with chronic medical renal disease. No acute findings. No hydronephrosis. Small amount of ascites incidentally noted. Electronically Signed   By: Rolm Baptise M.D.   On: 09/11/2018 10:52   Dg Chest Port 1 View  Result Date: 09/13/2018 CLINICAL DATA:  85 year old male with history of shortness of breath today. EXAM: PORTABLE CHEST 1 VIEW COMPARISON:  Chest x-ray 09/10/2018. FINDINGS: Small bilateral pleural effusions. Bibasilar opacities which may reflect areas of atelectasis and/or consolidation. Cephalization of the pulmonary vasculature. Enlargement of the  cardiopericardial silhouette. Upper mediastinal contours are within normal limits. Aortic atherosclerosis. Left-sided pacemaker with lead tips projecting over the expected location of the right atrium and right ventricle. IMPRESSION: 1. Enlargement of the cardiopericardial silhouette related to known pericardial effusion (better demonstrated on recent chest CT). 2. Small bilateral pleural effusions with bibasilar opacities favored to reflect areas of atelectasis and/or consolidation. 3. Pulmonary venous congestion, without frank pulmonary edema. Electronically Signed   By: Vinnie Langton M.D.   On: 09/13/2018 09:25   Dg Chest Portable 1 View  Result Date: 09/10/2018 CLINICAL DATA:  Shortness of breath. EXAM: PORTABLE CHEST 1 VIEW COMPARISON:  Radiograph of April 06, 2010. FINDINGS: Stable cardiomegaly. Left-sided pacemaker is unchanged in position. No pneumothorax is noted. No significant pleural effusion is noted. Minimal bibasilar subsegmental atelectasis is noted. Degenerative changes are seen involving both glenohumeral joints. IMPRESSION: Minimal bibasilar subsegmental atelectasis. Electronically Signed   By: Marijo Conception M.D.   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   Patient/Family are satisfied with care plan and all questions have been answered.  This document was prepared using Dragon voice recognition software and  may include unintentional dictation errors.     Ottie Glazier, M.D.  Division of [place]

## 2018-09-15 NOTE — Progress Notes (Signed)
[facility]  ROUNDING NOTE   Subjective:   States shortness of breath, wheezing and productive cough have improved.  Furosemide 73m IV x 2 since yesterday.  UOP 1300  Creatinine 1.64 (1.83)  Objective:  Vital signs in last 24 hours:  Temp:  [97.5 F (36.4 C)-98.1 F (36.7 C)] 97.5 F (36.4 C) (07/28 0823) Pulse Rate:  [72-98] 72 (07/28 0823) Resp:  [20-24] 24 (07/28 0242) BP: (126-184)/(60-107) 154/83 (07/28 0823) SpO2:  [90 %-100 %] 97 % (07/28 0823) Weight:  [113.7 kg] 113.7 kg (07/28 0234)  Weight change: -1.678 kg Filed Weights   09/13/18 0320 09/14/18 0339 09/15/18 0234  Weight: 115.9 kg 115.4 kg 113.7 kg    Intake/Output: I/O last 3 completed shifts: In: 250 [P.O.:240; I.V.:10] Out: 1900 [Urine:1900]   Intake/Output this shift:  Total I/O In: -  Out: 700 [Urine:700]  Physical Exam: General: NAD, laying in bed  Head: Normocephalic, atraumatic. Moist oral mucosal membranes  Eyes: Anicteric, PERRL  Neck: Supple, trachea midline  Lungs:  +wheezing, + coughing  Heart: Regular rate and rhythm  Abdomen:  Soft, nontender,   Extremities:  trace peripheral edema.  Neurologic: Nonfocal, moving all four extremities  Skin: No lesions        Basic Metabolic Panel: Recent Labs  Lab 09/10/18 1402  09/11/18 0608 09/12/18 0727 09/13/18 1202 09/14/18 0743 09/15/18 0309  NA 130*  --  132* 131* 134* 136 138  K 6.0*   < > 5.7* 4.7 3.9 3.7 3.7  CL 97*  --  98 95* 97* 98 97*  CO2 16*  --  20* _0 GLUCOSE 122*  --  116* 123* 166* 150* 160*  BUN 70*  --  75* 72* 61* 43* 38*  CREATININE 2.84*  --  2.73* 2.51* 1.99* 1.83* 1.64*  CALCIUM 8.8*  --  8.4* 8.2* 8.4* 8.7* 8.9  MG 2.6*  --   --   --   --   --   --    < > = values in this interval not displayed.    Liver Function Tests: Recent Labs  Lab 09/10/18 1402 09/12/18 0727  AST 19 18  ALT 14 15  ALKPHOS 71 64  BILITOT 1.0 0.8  PROT 7.7 6.2*  ALBUMIN 3.7 3.0*   No results for  input(s): LIPASE, AMYLASE in the last 168 hours. No results for input(s): AMMONIA in the last 168 hours.  CBC: Recent Labs  Lab 09/10/18 1402 09/11/18 0608 09/14/18 0743  WBC 14.3* 11.5* 10.5  NEUTROABS 12.9*  --   --   HGB 13.5 12.1* 13.0  HCT 41.4 36.7* 39.7  MCV 91.8 92.0 92.1  PLT 420* 307 284    Cardiac Enzymes: No results for input(s): CKTOTAL, CKMB, CKMBINDEX, TROPONINI in the last 168 hours.  BNP: Invalid input(s): POCBNP  CBG: Recent Labs  Lab 09/13/18 2108 09/14/18 0751 09/14/18 1240 09/14/18 1642 09/14/18 2145  GLUCAP 144* 134* 144* 145* 162*    Microbiology: Results for orders placed or performed during the hospital encounter of 09/10/18  SARS Coronavirus 2 ([facility]HEID - [facility] Hosp Order     Status: None   Collection Time: 09/10/18  2:03 PM   Specimen: Nasopharyngeal Swab  Result Value Ref Range Status   SARS Coronavirus 2 NEGATIVE NEGATIVE Final    Comment: (NOTE) If result is NEGATIVE SARS-CoV-2 target nucleic acids are NOT DETECTED. The SARS-CoV-2 RNA is generally detectable in upper and lower  respiratory specimens during the acute phase of infection. The lowest  concentration of SARS-CoV-2 viral copies this assay can detect is 250  copies / mL. A negative result does not preclude SARS-CoV-2 infection  and should not be used as the sole basis for treatment or other  patient management decisions.  A negative result may occur with  improper specimen collection / handling, submission of specimen other  than nasopharyngeal swab, presence of viral mutation(s) within the  areas targeted by this assay, and inadequate number of viral copies  (<250 copies / mL). A negative result must be combined with clinical  observations, patient history, and epidemiological information. If result is POSITIVE SARS-CoV-2 target nucleic acids are DETECTED. The SARS-CoV-2 RNA is generally detectable in upper and lower  respiratory  specimens dur ing the acute phase of infection.  Positive  results are indicative of active infection with SARS-CoV-2.  Clinical  correlation with patient history and other diagnostic information is  necessary to determine patient infection status.  Positive results do  not rule out bacterial infection or co-infection with other viruses. If result is PRESUMPTIVE POSTIVE SARS-CoV-2 nucleic acids MAY BE PRESENT.   A presumptive positive result was obtained on the submitted specimen  and confirmed on repeat testing.  While 2017/05/01 novel coronavirus  (SARS-CoV-2) nucleic acids may be present in the submitted sample  additional confirmatory testing may be necessary for epidemiological  and / or clinical management purposes  to differentiate between  SARS-CoV-2 and other Sarbecovirus currently known to infect humans.  If clinically indicated additional testing with an alternate test  methodology [phone]) is advised. The SARS-CoV-2 RNA is generally  detectable in upper and lower respiratory sp ecimens during the acute  phase of infection. The expected result is Negative. Fact Sheet for Patients:  StrictlyIdeas.no Fact Sheet for Healthcare Providers: BankingDealers.co.za This test is not yet approved or cleared by the [place] FDA and has been authorized for detection and/or diagnosis of SARS-CoV-2 by FDA under an Emergency Use Authorization (EUA).  This EUA will remain in effect (meaning this test can be used) for the duration of the COVID-19 declaration under Section 564(b)(1) of the Act, 21 U.S.C. section 360bbb-3(b)(1), unless the authorization is terminated or revoked sooner. Performed at [facility], [address] [place], [address] [address]     Coagulation Studies: Recent Labs    09/13/18 May 01, 2001  LABPROT 15.9*  INR 1.3*    Urinalysis: Recent Labs    09/13/18 0515  COLORURINE YELLOW*  LABSPEC 1.016  PHURINE 5.0   GLUCOSEU NEGATIVE  HGBUR NEGATIVE  BILIRUBINUR NEGATIVE  KETONESUR 5*  PROTEINUR NEGATIVE  NITRITE NEGATIVE  LEUKOCYTESUR NEGATIVE      Imaging: No results found.   Medications:    . docusate sodium  100 mg Oral BID  . feeding supplement (NEPRO CARB STEADY)  237 mL Oral BID BM  . heparin  5,000 Units Subcutaneous Q8H  . insulin aspart  0-9 Units Subcutaneous TID WC  . methylPREDNISolone (SOLU-MEDROL) injection  40 mg Intravenous BID  . multivitamin with minerals  1 tablet Oral Daily  . pantoprazole  40 mg Oral Daily  . sodium chloride flush  10 mL Intravenous Q12H   acetaminophen **OR** acetaminophen, bisacodyl, calcium carbonate, guaiFENesin-dextromethorphan, hydrALAZINE, HYDROcodone-acetaminophen, ipratropium-albuterol, LORazepam, ondansetron **OR** ondansetron (ZOFRAN) IV  Assessment/ Plan:  Javier Wong is a 85 y.o. white male withhypertension, hyperlipidemia, history of complete heart block status post dual chamber pacemaker placement, diabetes mellitus type II, gout, who  was admitted to [facility] on7/23/2020. Found to have right lung mass.   1. Acute renal failure with hyperkalemia and metabolic acidosis: on chronic kidney disease stage III with proteinuria:  baseline creatinine 1.5, EGFR 45 on 05/20/2018. Chronic kidney disease secondary to NSAIDs and diabetes Acute renal failure secondary to acute cardiorenal syndrome.  Required lokelma due to hyperkalemia.  - Discontinued naproxen on admission.  - holding ramipril, hydrochlorothiazide and furosemide.   2. Pericardial effusion: no acute tamponade physiology. No evidence of uremia Echocardiogram reviewed with patient.   3. Hypertension: elevated 154/83. Home regimen of ramipril, hydrochlorothiazide and furosemide. Holding due to acute renal failure - Start PRN IV hydralazine.  - IV furosemide as needed.   4. Right lower lobe lung mass.  - work up as per pulmonology and heme/onc. PET scan for later today.     LOS: 5 Javier Wong 7/28/202010:18 AM

## 2018-09-15 NOTE — Progress Notes (Signed)
Javier Wong   DOB:[birth year]   ZO#:[id number]    Subjective: Patient feels improved since being in the hospital.  Continues to have shortness of breath on exertion.  Needing oxygen.  Mild cough.  Mild to moderate swelling in the legs.  He slept well last night.  Objective:  Vitals:   09/15/18 0242 09/15/18 0823  BP: 126/60 (!) 154/83  Pulse: 91 72  Resp: (!) 24   Temp: 98.1 F (36.7 C) (!) 97.5 F (36.4 C)  SpO2: 100% 97%     Intake/Output Summary (Last 24 hours) at 09/15/2018 1238 Last data filed at 09/15/2018 1218 Gross per 24 hour  Intake 0 ml  Output 2000 ml  Net -2000 ml    Physical Exam  Constitutional: He is oriented to person, place, and time and well-developed, well-nourished, and in no distress.  Sitting in the bed.  2 L of nasal cannula oxygen.  HENT:  Head: Normocephalic and atraumatic.  Mouth/Throat: Oropharynx is clear and moist. No oropharyngeal exudate.  Eyes: Pupils are equal, round, and reactive to light.  Neck: Normal range of motion. Neck supple.  Cardiovascular: Normal rate and regular rhythm.  Pulmonary/Chest: No respiratory distress. He has no wheezes.  Decreased air entry bilaterally.   Abdominal: Soft. Bowel sounds are normal. He exhibits no distension and no mass. There is no abdominal tenderness. There is no rebound and no guarding.  Musculoskeletal: Normal range of motion.        General: Edema present. No tenderness.  Neurological: He is alert and oriented to person, place, and time.  Skin: Skin is warm.  Psychiatric: Affect normal.     Labs:  Lab Results  Component Value Date   WBC 10.5 09/14/2018   HGB 13.0 09/14/2018   HCT 39.7 09/14/2018   MCV 92.1 09/14/2018   PLT 284 09/14/2018   NEUTROABS 12.9 (H) 09/10/2018    Lab Results  Component Value Date   NA 138 09/15/2018   K 3.7 09/15/2018   CL 97 (L) 09/15/2018   CO2 29 09/15/2018    Studies:  No results found.  Mass of lower lobe of right lung 85 year old male patient  non-smoker/mild CKD is currently admitted to hospital for worsening insufficiency/right lower lobe lung mass/bulky mediastinal adenopathy  #Right lower lobe lung mass/bulky mediastinal adenopathy/pericardial effusion-highly suspicious for malignant process.  Awaiting PET scan this morning.  #Chronic kidney disease/acute renal failure-Baseline creatinine 1.5.  Improving at 1.8.  Nephrology following.  #Discussed with patient's son Jonathon Jordan [[place]]-updated the son of patient's condition/need for biopsy given the high possibility of this being a malignancy.   Cammie Sickle, MD 09/15/2018  12:38 PM

## 2018-09-15 NOTE — Progress Notes (Signed)
[place] [facility] [place] NAME: Javier Wong    MR#:  [id number]  DATE OF BIRTH:  1932/09/20  SUBJECTIVE:   Shortness of breath has improved Still has cough No chest pain  REVIEW OF SYSTEMS:  Review of Systems  Constitutional: Negative for chills, fever and malaise/fatigue.  HENT: Negative for congestion and sore throat.   Eyes: Negative for blurred vision and double vision.  Respiratory: Positive for shortness of breath. Negative for cough.   Cardiovascular: Negative for chest pain and palpitations.  Gastrointestinal: Negative for abdominal pain, nausea and vomiting.  Genitourinary: Negative for dysuria and urgency.  Musculoskeletal: Negative for back pain and neck pain.  Neurological: Positive for weakness. Negative for dizziness, focal weakness and headaches.  Psychiatric/Behavioral: Negative for depression. The patient is not nervous/anxious.     DRUG ALLERGIES:  No Known Allergies VITALS:  Blood pressure (!) 154/83, pulse 72, temperature (!) 97.5 F (36.4 C), temperature source Oral, resp. rate (!) 24, height 5\' 11"  (1.803 m), weight 113.7 kg, SpO2 97 %. PHYSICAL EXAMINATION:  Physical Exam  GENERAL:   Denies any complaints. HEENT: Head atraumatic, normoceph and accommodation. No scleral icterus. Extraocular muscles intact. Oropharynx and nasopharynx clear.  NECK:  Supple, no jugular venous distention. No thyroid enlargement. LUNGS -clear to auscultation bilaterally CARDIOVASCULAR: Bradycardia resolved, heart rate 62 bpm. ABDOMEN: Soft, nontender, nondistended. Bowel sounds present.  EXTREMITIES: No pedal edema, cyanosis, or clubbing.  NEUROLOGIC: CN 2-12 intact, no focal deficits.  Sensation intact throughout. Gait not checked.  PSYCHIATRIC: The patient is alert and oriented x 3.  Anxious SKIN: No obvious rash, lesion, or ulcer.  LABORATORY PANEL:  Male CBC Recent Labs  Lab 09/14/18 0743  WBC 10.5  HGB 13.0  HCT  39.7  PLT 284   ------------------------------------------------------------------------------------------------------------------ Chemistries  Recent Labs  Lab 09/10/18 1402  09/12/18 0727  09/15/18 0309  NA 130*   < > 131*   < > 138  K 6.0*   < > 4.7   < > 3.7  CL 97*   < > 95*   < > 97*  CO2 16*   < > 23   < > 29  GLUCOSE 122*   < > 123*   < > 160*  BUN 70*   < > 72*   < > 38*  CREATININE 2.84*   < > 2.51*   < > 1.64*  CALCIUM 8.8*   < > 8.2*   < > 8.9  MG 2.6*  --   --   --   --   AST 19  --  18  --   --   ALT 14  --  15  --   --   ALKPHOS 71  --  64  --   --   BILITOT 1.0  --  0.8  --   --    < > = values in this interval not displayed.   RADIOLOGY:  No results found. ASSESSMENT AND PLAN:   *Acute on chronic diastolic congestive heart failure Gave 1 more dose of IV Lasix today.  Will reassess in the morning. Wean O2  * Moderate pericardial effusion- seen on echo.  No evidence of tamponade physiology.  Repeat echocardiogram with similar pericardial effusion.  Appreciate cardiology input.  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  Scheduled for PET today  * Acute kidney injury- likely secondary to NSAID use and poor  cardiac output.  Renal function is slowly improving, creatinine down from 2.84-->1.83 -->1.6 - Renal ultrasound showed medical renal disease, but no acute abnormalities. - Nephrology consulted and appreciate input.  Discussed with Dr. Juleen China. - Avoid nephrotoxic agents.  *Hyperkalemia secondary to acute kidney injury.  Resolved.  *Atrial flutter found on EKG.  Cardiology following.  Has pacemaker in place.  Not started on anticoagulation due to patient awaiting further treatment plan for pericardial effusion and also biopsy for lung mass.  * Deconditioning- due to above -PT consult  * Type 2 diabetes -Continue SSI  All the records are reviewed and case discussed with Care Management/Social Worker.  Management plans discussed with the patient, family and they are in agreement.  CODE STATUS: Full Code  TOTAL TIME TAKING CARE OF THIS PATIENT: 35 minutes.   POSSIBLE D/C IN 2-3 DAYS, DEPENDING ON CLINICAL CONDITION.  Javier Wong M.D on 09/15/2018 at 12:15 PM  Between 7am to 6pm - Pager - [phone]  After 6pm go to [email or web address] - Proofreader  [facility]  Office  [phone]  CC: Primary care physician; Javier Axe, MD  Note: This dictation was prepared with Dragon dictation along with smaller phrase technology. Any transcriptional errors that result from this process are unintentional.

## 2018-09-15 NOTE — Plan of Care (Signed)
  Problem: Activity: Goal: Activity intolerance will improve Outcome: Progressing Note: Up with 1 assist to [facility]   Problem: Respiratory: Goal: Respiratory symptoms related to disease process will be avoided Outcome: Progressing Note: Did have to place pt on 2L O2, sats were in the high 80's on room air   Problem: Urinary Elimination: Goal: Progression of disease will be identified and treated Outcome: Progressing   Problem: Pain Managment: Goal: General experience of comfort will improve Outcome: Progressing Note: No complaints of pain

## 2018-09-15 NOTE — Progress Notes (Signed)
PT Cancellation Note  Patient Details Name: Javier Wong MRN: [id number] DOB: 1932-09-18   Cancelled Treatment:    Reason Eval/Treat Not Completed: Patient declined PT services secondary to fatigue from recent PET scan and nausea, nursing notified.  Will attempt to see pt at a future date as appropriate.     Linus Salmons PT, DPT 09/15/18, 3:54 PM

## 2018-09-15 NOTE — Plan of Care (Signed)
  Problem: Education: Goal: Knowledge of disease and its progression will improve 09/15/2018 1330 by Daron Offer, RN Outcome: Progressing 09/15/2018 1321 by Daron Offer, RN Outcome: Progressing   Problem: Health Behavior/Discharge Planning: Goal: Ability to manage health-related needs will improve 09/15/2018 1330 by Daron Offer, RN Outcome: Progressing 09/15/2018 1321 by Daron Offer, RN Outcome: Progressing   Problem: Clinical Measurements: Goal: Complications related to the disease process or treatment will be avoided or minimized 09/15/2018 1330 by Daron Offer, RN Outcome: Progressing 09/15/2018 1321 by Daron Offer, RN Outcome: Progressing   Problem: Activity: Goal: Activity intolerance will improve 09/15/2018 1330 by Daron Offer, RN Outcome: Progressing 09/15/2018 1321 by Daron Offer, RN Outcome: Progressing   Problem: Fluid Volume: Goal: Fluid volume balance will be maintained or improved 09/15/2018 1330 by Daron Offer, RN Outcome: Progressing 09/15/2018 1321 by Daron Offer, RN Outcome: Progressing   Problem: Nutritional: Goal: Ability to make appropriate dietary choices will improve 09/15/2018 1330 by Daron Offer, RN Outcome: Progressing 09/15/2018 1321 by Daron Offer, RN Outcome: Progressing   Problem: Respiratory: Goal: Respiratory symptoms related to disease process will be avoided 09/15/2018 1330 by Daron Offer, RN Outcome: Progressing 09/15/2018 1321 by Daron Offer, RN Outcome: Progressing   Problem: Self-Concept: Goal: Body image disturbance will be avoided or minimized 09/15/2018 1330 by Daron Offer, RN Outcome: Progressing 09/15/2018 1321 by Daron Offer, RN Outcome: Progressing   Problem: Urinary Elimination: Goal: Progression of disease will be identified and treated 09/15/2018 1330 by Daron Offer, RN Outcome: Progressing 09/15/2018 1321 by Daron Offer, RN Outcome:  Progressing Note: Patient wotrki=   Problem: Education: Goal: Knowledge of General Education information will improve Description: Including pain rating scale, medication(s)/side effects and non-pharmacologic comfort measures Outcome: Progressing   Problem: Health Behavior/Discharge Planning: Goal: Ability to manage health-related needs will improve Outcome: Progressing Note: Patient has been in discussion w/ his oncologist and pulmonologist regarding condition. Awaiting PET scan result. Will continue to monitor overall progression. Wenda Low [facility]

## 2018-09-15 NOTE — Progress Notes (Signed)
Nutrition Follow Up Note   DOCUMENTATION CODES:   Obesity unspecified  INTERVENTION:   Discontinue Nepro Shake as pt refusing  Add Ensure Enlive po BID, each supplement provides 350 kcal and 20 grams of protein  MVI daily   Liberalize diet- no salt packs on trays   Bowel regimen per MD  NUTRITION DIAGNOSIS:   Inadequate oral intake related to acute illness as evidenced by per patient/family report.  GOAL:   Patient will meet greater than or equal to 90% of their needs  MONITOR:   PO intake, Supplement acceptance, Labs, Weight trends, Skin, I & O's  REASON FOR ASSESSMENT:   Malnutrition Screening Tool    ASSESSMENT:   85 y.o. male with a PMHx of hypertension, hyperlipidemia, chronic kidney disease stage III baseline creatinine 1.5, EGFR 45, history of complete heart block status post dual chamber pacemaker placement, who was admitted to [facility] on 09/10/2018 for evaluation of weakness, weakness, and poor p.o. intake.  Pt with improved appetite and oral intake; pt eating 100% of meals but refusing Nepro supplements. RD will discontinue Nepro and add Ensure as pt may like this better. RD will also liberalize the heart healthy portion of pt's diet as this is restrictive of protein. Per chart, pt with ~6lb weight gain since admit; RD will continue to monitor. Pt reporting constipation; recommend bowel regimen as needed per MD.   Medications reviewed and include: colace, heparin, insulin, MVI, protonix, prednisone   Labs reviewed: K 3.7 wnl, BUN 38(H), creat 1.64(H) cbgs- 144, 134, 144, 145, 162 x 24hrs  Diet Order:   Diet Order            Diet heart healthy/carb modified Room service appropriate? Yes; Fluid consistency: Thin  Diet effective now             EDUCATION NEEDS:   No education needs have been identified at this time  Skin:  Skin Assessment: Reviewed RN Assessment(ecchymosis)  Last BM:  7/23- Constipation  Height:   Ht Readings from Last 1 Encounters:   09/10/18 5' 11" (1.803 m)    Weight:   Wt Readings from Last 1 Encounters:  09/15/18 113.7 kg    Ideal Body Weight:  78.2 kg  BMI:  Body mass index is 34.97 kg/m.  Estimated Nutritional Needs:   Kcal:  2200-2500kcal/day  Protein:  110-125g/day  Fluid:  >2L/day  Casey Campbell MS, RD, LDN Pager #- [phone] Office#- [phone] After Hours Pager: [phone] 

## 2018-09-16 ENCOUNTER — Inpatient Hospital Stay: Payer: Medicare Other

## 2018-09-16 DIAGNOSIS — Z7189 Other specified counseling: Secondary | ICD-10-CM

## 2018-09-16 DIAGNOSIS — Z515 Encounter for palliative care: Secondary | ICD-10-CM

## 2018-09-16 LAB — GLUCOSE, CAPILLARY
Glucose-Capillary: 124 mg/dL — ABNORMAL HIGH (ref 70–99)
Glucose-Capillary: 115 mg/dL — ABNORMAL HIGH (ref 70–99)
Glucose-Capillary: 148 mg/dL — ABNORMAL HIGH (ref 70–99)
Glucose-Capillary: 156 mg/dL — ABNORMAL HIGH (ref 70–99)
Glucose-Capillary: 165 mg/dL — ABNORMAL HIGH (ref 70–99)

## 2018-09-16 MED ORDER — POLYETHYLENE GLYCOL 3350 17 G PO PACK
17.0000 g | PACK | Freq: Two times a day (BID) | ORAL | Status: AC
  Administered 2018-09-16 (×2): 17 g via ORAL
  Filled 2018-09-16 (×2): qty 1

## 2018-09-16 NOTE — Progress Notes (Signed)
[facility] [place] NAME: Javier Wong    MR#:  [id number]  DATE OF BIRTH:  1933-01-22  SUBJECTIVE:   Shortness of breath has improved Feels weak Constipated  REVIEW OF SYSTEMS:  Review of Systems  Constitutional: Negative for chills, fever and malaise/fatigue.  HENT: Negative for congestion and sore throat.   Eyes: Negative for blurred vision and double vision.  Respiratory: Positive for shortness of breath. Negative for cough.   Cardiovascular: Negative for chest pain and palpitations.  Gastrointestinal: Negative for abdominal pain, nausea and vomiting.  Genitourinary: Negative for dysuria and urgency.  Musculoskeletal: Negative for back pain and neck pain.  Neurological: Positive for weakness. Negative for dizziness, focal weakness and headaches.  Psychiatric/Behavioral: Negative for depression. The patient is not nervous/anxious.     DRUG ALLERGIES:  No Known Allergies VITALS:  Blood pressure (!) 144/75, pulse (!) 56, temperature 97.8 F (36.6 C), temperature source Oral, resp. rate (!) 24, height 5\' 11"  (1.803 m), weight 112.8 kg, SpO2 100 %. PHYSICAL EXAMINATION:  Physical Exam  GENERAL:   Denies any complaints. HEENT: Head atraumatic, normoceph and accommodation. No scleral icterus. Extraocular muscles intact. Oropharynx and nasopharynx clear.  NECK:  Supple, no jugular venous distention. No thyroid enlargement. LUNGS -clear to auscultation bilaterally CARDIOVASCULAR: Bradycardia resolved, heart rate 62 bpm. ABDOMEN: Soft, nontender, nondistended. Bowel sounds present.  EXTREMITIES: No pedal edema, cyanosis, or clubbing.  NEUROLOGIC: CN 2-12 intact, no focal deficits.  Sensation intact throughout. Gait not checked.  PSYCHIATRIC: The patient is alert and oriented x 3.   SKIN: No obvious rash, lesion, or ulcer.  LABORATORY PANEL:  Male CBC Recent Labs  Lab 09/14/18 0743  WBC 10.5  HGB 13.0  HCT 39.7  PLT 284    ------------------------------------------------------------------------------------------------------------------ Chemistries  Recent Labs  Lab 09/10/18 1402  09/12/18 0727  09/15/18 0309  NA 130*   < > 131*   < > 138  K 6.0*   < > 4.7   < > 3.7  CL 97*   < > 95*   < > 97*  CO2 16*   < > 23   < > 29  GLUCOSE 122*   < > 123*   < > 160*  BUN 70*   < > 72*   < > 38*  CREATININE 2.84*   < > 2.51*   < > 1.64*  CALCIUM 8.8*   < > 8.2*   < > 8.9  MG 2.6*  --   --   --   --   AST 19  --  18  --   --   ALT 14  --  15  --   --   ALKPHOS 71  --  64  --   --   BILITOT 1.0  --  0.8  --   --    < > = values in this interval not displayed.   RADIOLOGY:  Nm Pet Image Initial (pi) Skull Base To Thigh  Result Date: 09/15/2018 CLINICAL DATA:  Initial treatment strategy for lung mass. EXAM: NUCLEAR MEDICINE PET SKULL BASE TO THIGH TECHNIQUE: 14.0 mCi F-18 FDG was injected intravenously. Full-ring PET imaging was performed from the skull base to thigh after the radiotracer. CT data was obtained and used for attenuation correction and anatomic localization. Fasting blood glucose: 124 mg/dl COMPARISON:  Chest CT 09/11/2018 FINDINGS: Mediastinal blood pool activity: SUV max 2.4 Liver activity: SUV max NA NECK: No hypermetabolic lymph  nodes in the neck. Incidental CT findings: none CHEST: 6.8 x 5.1 cm soft tissue mass is identified in the infrahilar aspect of the medial right lower lobe with extension of soft tissue into the mediastinum/subcarinal station. This lesion is markedly hypermetabolic with SUV max = 00.9. No definite evidence for contralateral hypermetabolic hilar lymphadenopathy. No hypermetabolic lymphadenopathy in the axillary regions. Hypermetabolic soft tissue is identified in the region of the right diaphragmatic crus/esophageal hiatus and extending posterolaterally on the right. This abnormal soft tissue is markedly hypermetabolic with SUV max = 38.1. The portion of this mass anterior to the  descending aorta on 01/30 2/3 measures 4.1 x 6.2 cm. I think the esophagus is deflected to the left of this lesion, but anatomy in this region is poorly demonstrated on noncontrast imaging. I cannot exclude that this lesion arises from the esophagus. Incidental CT findings: Left-sided permanent pacemaker noted. The heart is enlarged. Small pericardial effusion is similar to prior. Coronary artery calcification is evident. Atherosclerotic calcification is noted in the wall of the thoracic aorta. Bilateral collapse/consolidation noted in the lower lungs with small to moderate bilateral pleural effusions. ABDOMEN/PELVIS: Uptake in the liver parenchyma is very mottled which could obscure hypermetabolic small liver lesions. There is a subtle hypoattenuating focus in the lateral segment left liver (149/3) that shows a more prominent degree of FDG accumulation ( SUV max = 6.1.) than other areas of the liver, suggesting that this represents a real hypermetabolic liver lesion/metastasis. 2.4 cm lymph node just cranial to the celiac axis is hypermetabolic with SUV max = 82.9. 1.6 cm soft tissue nodule lateral to the lower pole left kidney shows no hypermetabolism. Incidental CT findings: Nodular liver contour compatible with cirrhosis. There is abdominal aortic atherosclerosis without aneurysm. SKELETON: Diffusely mottled uptake identified in the marrow space with focal hypermetabolism identified in the posterior cortex of the proximal right femur ( SUV max = 7.8). This corresponds to a subtle area of cortical lucency visible on 269/3. Incidental CT findings: Degenerative changes noted in the shoulders bilaterally. IMPRESSION: 1. The infrahilar right lung lesion, filling the as ago esophageal recess extending up into the subcarinal region is markedly hypermetabolic consistent with neoplasm. Bulky soft tissue lesions adjacent/involving the distal esophagus and extending posteriorly along the right diaphragmatic crus are not  well demonstrated on this noncontrast exam, but are markedly hypermetabolic. This disease appears to deflect the esophagus to the left, but esophageal involvement/origin of the neoplasm not excluded. 2. Probable hypermetabolic metastasis in the lateral segment left liver, poorly demonstrated on noncontrast CT imaging. 3. 2.4 cm hypermetabolic lymph node just cranial to the celiac axis consistent with metastatic involvement. 4. Hypermetabolic lucent lesion in the posterior cortex of the proximal right femur, concerning for metastatic involvement. 5. Bibasilar collapse/consolidation in the lungs with small to moderate bilateral pleural effusions. 6. Nodular hepatic contour consistent with cirrhosis. 7. Small pericardial effusion. 8.  Aortic Atherosclerois (ICD10-170.0) Electronically Signed   By: Misty Stanley M.D.   On: 09/15/2018 14:14   Dg Chest Port 1 View  Result Date: 09/15/2018 CLINICAL DATA:  85 year old male patient non-smoker/mild CKD is currently admitted to hospital for worsening insufficiency/right lower lobe lung mass/bulky mediastinal adenopathy. Hx of HTN and DM. Hx of pacemaker. EXAM: PORTABLE CHEST 1 VIEW COMPARISON:  09/13/2018 and earlier exams. FINDINGS: Bilateral pleural effusions obscure the hemidiaphragms, extending into the oblique fissure on the right, similar on the left and mildly increased on the right from the most recent prior exam. Associated basilar lung opacity  is consistent with atelectasis, similar to the prior study. Upper lungs remain clear. There is no evidence of pulmonary edema. Cardiac silhouette is mildly enlarged, stable. Stable left anterior chest wall sequential pacemaker. No pneumothorax. IMPRESSION: 1. Mild interval increase in the size of the right pleural effusion with stable appearance of the left pleural effusion. Stable associated lung base atelectasis. 2. No convincing pneumonia or pulmonary edema. 3. No change in the enlargement of the cardiopericardial  silhouette. Electronically Signed   By: Lajean Manes M.D.   On: 09/15/2018 13:19   ASSESSMENT AND PLAN:   * Right lower lobe lung mass with subcarinal extension Seen by pulmonary and oncology.   PET with Mets for liver and femur Scheduled for UD biopsy of liver lesion  *Acute on chronic diastolic congestive heart failure Improved with IV lasix  * Moderate pericardial effusion- seen on echo.  No evidence of tamponade physiology.  Repeat echocardiogram with similar pericardial effusion.  Appreciate cardiology input.  * Acute kidney injury- likely secondary to NSAID use and poor cardiac output.  Renal function is slowly improving, creatinine down from 2.84-->1.83 -->1.6 - Renal ultrasound showed medical renal disease, but no acute abnormalities. - Nephrology consulted and appreciate input.  Discussed with Dr. Juleen China. - Avoid nephrotoxic agents.  *Hyperkalemia secondary to acute kidney injury.  Resolved.  *Atrial flutter found on EKG.  Cardiology following.  Has pacemaker in place.  Not started on anticoagulation due to patient awaiting further treatment plan for pericardial effusion and also biopsy for lung mass.  * Deconditioning- due to above -PT consulted.   * Type 2 diabetes -Continue SSI  All the records are reviewed and case discussed with Care Management/Social Worker. Management plans discussed with the patient, family and they are in agreement.  CODE STATUS: DNR  TOTAL TIME TAKING CARE OF THIS PATIENT: 35 minutes.   POSSIBLE D/C IN 2-3 DAYS, DEPENDING ON CLINICAL CONDITION.  Leia Alf Davius Goudeau M.D on 09/16/2018 at 11:49 AM  Between 7am to 6pm - Pager - [phone]  After 6pm go to [email or web address] - Proofreader  [facility]  Office  [phone]  CC: Primary care physician; Glendon Axe, MD  Note: This dictation was prepared with Dragon dictation along with smaller phrase technology. Any transcriptional errors that result from  this process are unintentional.

## 2018-09-16 NOTE — Progress Notes (Addendum)
Ch was paged to see pt. Pt shared that he was struggling with the recent diagnosis of lung cancer. Pt lamented about how the news was given and was frustrated with the change in his code status, which would explain his resistance of wearing the DNR band. Ch provided a compassionate presence as the pt shared that he still talks to his "baby" all the time (pt was referring to his deceased wife). Pt seemed to be dealing with chronic grief related to the passing of his wife from lung cancer that was not detected until she was in her final days. Pt requested prayer. Ch prayed for pt's ability to return home safely and have a quality of life that ushers in joy and fulfillment.  F/U should include checking with staff for pt's GOC/TOC and spiritual support for pt.   09/16/18 2200  Clinical Encounter Type  Visited With Patient;Health care provider  Visit Type Psychological support;Spiritual support;Social support  Referral From Nurse  Consult/Referral To Chaplain  Spiritual Encounters  Spiritual Needs Prayer;Emotional;Grief support  Stress Factors  Patient Stress Factors Exhausted;Health changes;Loss;Loss of control;Major life changes  Family Stress Factors None identified

## 2018-09-16 NOTE — Consult Note (Addendum)
Consultation Note Date: 09/16/2018   Patient Name: Javier Wong  DOB: [birth year]  MRN: [id number]  Age / Sex: 85 y.o., male  PCP: Glendon Axe, MD Referring Physician: Hillary Bow, MD  Reason for Consultation: Establishing goals of care  HPI/Patient Profile: Javier Wong  is a 85 y.o. male with a known history of hypertension, diabetes mellitus type 2 comes in because of fatigue.  Patient told me that he is feeling Very weak, nauseous and has poor p.o. intake for 4 weeks.   Clinical Assessment and Goals of Care: Patient is sitting on side of bed. He is anxious and states he is scared; he tells me he has an [place] scheduled. He states he is afraid of the unknown. He states "I don't want to die. Let God decide." He states "I just found all this out and they want to put this band on me."  He lives alone. He was widowed in 2005. He states his wife and mother died from cancer. 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 He states he used to knock astestos off the ceiling. He states now since retirement, he enjoys Therapist, music.   He states he wants all life prolonging care at this time to help keep him alive. He will need time after speaking with oncology to determine how he wants to proceed, after finding out confirmatory diagnoses and options, but will most certainly pursue oncologic intervention if it is possible.   Will continue to follow. He states his son is coming in from [place].       SUMMARY OF RECOMMENDATIONS   Continue current treatment plans.  Recommend palliative at D/C.   Prognosis:   Unable to determine  Discharge Planning: To Be Determined Likely D/C with palliative.      Primary Diagnoses: Present on Admission: **None**   I have reviewed the medical record, interviewed the patient  and family, and examined the patient. The following aspects are pertinent.  Past Medical History:  Diagnosis Date  . Chronic kidney disease   . Diabetes type 2, controlled ([name])   . Hypertension    Social History   Socioeconomic History  . Marital status: Married    Spouse name: Not on file  . Number of children: Not on file  . Years of education: Not on file  . Highest education level: Not on file  Occupational History  . Not on file  Social Needs  . Financial resource strain: Not on file  . Food insecurity    Worry: Not on file    Inability: Not on file  . Transportation needs    Medical: Not on file    Non-medical: Not on file  Tobacco Use  . Smoking status: Never Smoker  . Smokeless tobacco: Never Used  Substance and Sexual Activity  . Alcohol use: Never    Frequency: Never  . Drug use: Never  . Sexual activity: Not  Currently  Lifestyle  . Physical activity    Days per week: Not on file    Minutes per session: Not on file  . Stress: Not on file  Relationships  . Social Herbalist on phone: Not on file    Gets together: Not on file    Attends religious service: Not on file    Active member of club or organization: Not on file    Attends meetings of clubs or organizations: Not on file    Relationship status: Not on file  Other Topics Concern  . Not on file  Social History Narrative   Patient is alone.  He is fairly active for his age.  Denies any prior history of smoking.  No alcohol.  He admits to exposure to asbestos.    Family History  Problem Relation Age of Onset  . Mesothelioma Father    Scheduled Meds: . docusate sodium  100 mg Oral BID  . feeding supplement (ENSURE ENLIVE)  237 mL Oral BID BM  . heparin  5,000 Units Subcutaneous Q8H  . insulin aspart  0-9 Units Subcutaneous TID WC  . multivitamin with minerals  1 tablet Oral Daily  . pantoprazole  40 mg Oral Daily  . polyethylene glycol  17 g Oral BID  . predniSONE  40 mg Oral Q  breakfast  . sodium chloride flush  10 mL Intravenous Q12H   Continuous Infusions: PRN Meds:.acetaminophen **OR** acetaminophen, bisacodyl, calcium carbonate, guaiFENesin-dextromethorphan, hydrALAZINE, HYDROcodone-acetaminophen, ipratropium-albuterol, LORazepam, ondansetron **OR** ondansetron (ZOFRAN) IV Medications Prior to Admission:  Prior to Admission medications   Medication Sig Start Date End Date Taking? Authorizing Provider  allopurinol (ZYLOPRIM) 100 MG tablet Take 100 mg by mouth daily. 07/10/18  Yes [provider]  aspirin EC 81 MG tablet Take 81 mg by mouth daily.   Yes [provider]  cyanocobalamin (,VITAMIN B-12,) 1000 MCG/ML injection Inject 1,000 mcg into the muscle every 30 (thirty) days. 03/24/18  Yes [provider]  furosemide (LASIX) 40 MG tablet Take 40 mg by mouth daily. 02/20/18  Yes [provider]  hydrochlorothiazide (HYDRODIURIL) 25 MG tablet Take 25 mg by mouth daily. 02/20/18  Yes [provider]  insulin lispro protamine-lispro (HUMALOG 75/25 MIX) (75-25) 100 UNIT/ML SUSP injection Inject 10 Units into the skin daily with breakfast. 02/20/18  Yes [provider]  metFORMIN (GLUCOPHAGE) 1000 MG tablet Take 1,000 mg by mouth 2 (two) times a day. 03/31/18  Yes [provider]  naproxen sodium (ALEVE) 220 MG tablet Take 220 mg by mouth daily as needed.   Yes [provider]  ramipril (ALTACE) 10 MG capsule Take 10 mg by mouth 2 (two) times a day. 07/10/18  Yes [provider]  simvastatin (ZOCOR) 40 MG tablet Take 40 mg by mouth at bedtime. 02/20/18  Yes [provider]  traMADol (ULTRAM) 50 MG tablet Take 50 mg by mouth every 8 (eight) hours as needed for pain. 04/14/18  Yes [provider]   No Known Allergies Review of Systems  Constitutional: Positive for fatigue.    Physical Exam Pulmonary:     Effort: Pulmonary effort is normal.  Skin:    General: Skin is warm and dry.   Neurological:     Mental Status: He is alert.     Comments: Oriented     Vital Signs: BP (!) 144/75 (BP Location: Left Arm)   Pulse (!) 56   Temp 97.8 F (36.6 C) (Oral)  Resp (!) 24   Ht 5\' 11"  (1.803 m)   Wt 112.8 kg   SpO2 100%   BMI 34.69 kg/m  Pain Scale: 0-10 POSS *See Group Information*: 1-Acceptable,Awake and alert Pain Score: 0-No pain   SpO2: SpO2: 100 % O2 Device:SpO2: 100 % O2 Flow Rate: .O2 Flow Rate (L/min): 2 L/min  IO: Intake/output summary:   Intake/Output Summary (Last 24 hours) at 09/16/2018 1445 Last data filed at 09/16/2018 1116 Gross per 24 hour  Intake 370 ml  Output 450 ml  Net -80 ml    LBM: Last BM Date: 09/10/18 Baseline Weight: Weight: 108.9 kg Most recent weight: Weight: 112.8 kg     Palliative Assessment/Data:     Time In: 2:45 Time Out: 3:20 Time Total: 35 min Greater than 50%  of this time was spent counseling and coordinating care related to the above assessment and plan.  Signed by: Asencion Gowda, NP   Please contact Palliative Medicine Team phone at [phone] for questions and concerns.  For individual provider: See Shea Evans

## 2018-09-16 NOTE — TOC Transition Note (Addendum)
Transition of Care [facility]) - CM/SW Discharge Note   Patient Details  Name: Javier Wong MRN: [id number] Date of Birth: July 23, 1932  [facility] CM/SW Contact:  Ross Ludwig, LCSW Phone Number: 09/16/2018, 10:37 AM   Clinical Narrative:     CSW was informed that patient needed a more narrow walker.  CSW contacted Brad at [facility], and he spoke to patient, patient has a bariatric walker at home.  Brad at [facility] will provide patient with a more narrow walker.  CSW spoke with APS they confirmed there is not a current case open with patient.  CSW spoke to [place] worker Laury Deep and completed an APS report on patient's living environment.  CSW also spoke with patient to discuss if he had any concerns about returning back home.  Patient stated that he is happy at home, he does not want to go to a SNF for short term rehab due to covid 19.  CSW also explained to the patient the advantage of going to SNF verse going home with home health, patient still would like to go home instead.  CSW provided choice of agency and he said whoever will take his insurance is fine with him.  CSW advised patient to think about SNF placement, and if he changes his mind, SNF can still be arranged.  CSW to continue to follow patient's progress throughout discharge planning.   Barriers to Discharge: Continued Medical Work up   Patient Goals and CMS Choice  Patient would like to return home with home health.      Discharge Placement                       Discharge Plan and Services In-house Referral: Clinical Social Work                                   Social Determinants of Health (SDOH) Interventions     Readmission Risk Interventions Readmission Risk Prevention Plan 09/14/2018  Medication Screening Complete  Transportation Screening Complete  Some recent data might be hidden

## 2018-09-16 NOTE — Progress Notes (Signed)
[facility]  ROUNDING NOTE   Subjective:   No new labs. PET scan results reviewed with patient.  Objective:  Vital signs in last 24 hours:  Temp:  [97.4 F (36.3 C)-98.6 F (37 C)] 97.8 F (36.6 C) (07/29 0738) Pulse Rate:  [54-57] 56 (07/29 0738) BP: (124-144)/(60-75) 144/75 (07/29 0738) SpO2:  [94 %-100 %] 100 % (07/29 0738) Weight:  [112.8 kg] 112.8 kg (07/29 0328)  Weight change: -0.907 kg Filed Weights   09/14/18 0339 09/15/18 0234 09/16/18 0328  Weight: 115.4 kg 113.7 kg 112.8 kg    Intake/Output: I/O last 3 completed shifts: In: 240 [P.O.:240] Out: 2050 [Urine:2050]   Intake/Output this shift:  Total I/O In: 130 [P.O.:120; I.V.:10] Out: 200 [Urine:200]  Physical Exam: General: NAD, laying in bed  Head: Normocephalic, atraumatic. Moist oral mucosal membranes  Eyes: Anicteric, PERRL  Neck: Supple, trachea midline  Lungs:  +wheezing, + coughing  Heart: Regular rate and rhythm  Abdomen:  Soft, nontender,   Extremities:  trace peripheral edema.  Neurologic: Nonfocal, moving all four extremities  Skin: No lesions        Basic Metabolic Panel: Recent Labs  Lab 09/10/18 1402  09/11/18 0608 09/12/18 0727 09/13/18 1202 09/14/18 0743 09/15/18 0309  NA 130*  --  132* 131* 134* 136 138  K 6.0*   < > 5.7* 4.7 3.9 3.7 3.7  CL 97*  --  98 95* 97* 98 97*  CO2 16*  --  20* _0 GLUCOSE 122*  --  116* 123* 166* 150* 160*  BUN 70*  --  75* 72* 61* 43* 38*  CREATININE 2.84*  --  2.73* 2.51* 1.99* 1.83* 1.64*  CALCIUM 8.8*  --  8.4* 8.2* 8.4* 8.7* 8.9  MG 2.6*  --   --   --   --   --   --    < > = values in this interval not displayed.    Liver Function Tests: Recent Labs  Lab 09/10/18 1402 09/12/18 0727  AST 19 18  ALT 14 15  ALKPHOS 71 64  BILITOT 1.0 0.8  PROT 7.7 6.2*  ALBUMIN 3.7 3.0*   No results for input(s): LIPASE, AMYLASE in the last 168 hours. No results for input(s): AMMONIA in the last 168 hours.  CBC: Recent  Labs  Lab 09/10/18 1402 09/11/18 0608 09/14/18 0743  WBC 14.3* 11.5* 10.5  NEUTROABS 12.9*  --   --   HGB 13.5 12.1* 13.0  HCT 41.4 36.7* 39.7  MCV 91.8 92.0 92.1  PLT 420* 307 284    Cardiac Enzymes: No results for input(s): CKTOTAL, CKMB, CKMBINDEX, TROPONINI in the last 168 hours.  BNP: Invalid input(s): POCBNP  CBG: Recent Labs  Lab 09/14/18 2145 09/15/18 1737 09/15/18 2044 09/16/18 0739 09/16/18 1144  GLUCAP 162* 215* 169* 115* 148*    Microbiology: Results for orders placed or performed during the hospital encounter of 09/10/18  SARS Coronavirus 2 (CEPHEID - Performed in [place] hospital lab), Hosp Order     Status: None   Collection Time: 09/10/18  2:03 PM   Specimen: Nasopharyngeal Swab  Result Value Ref Range Status   SARS Coronavirus 2 NEGATIVE NEGATIVE Final    Comment: (NOTE) If result is NEGATIVE SARS-CoV-2 target nucleic acids are NOT DETECTED. The SARS-CoV-2 RNA is generally detectable in upper and lower  respiratory specimens during the acute phase of infection. The lowest  concentration of SARS-CoV-2 viral copies this assay can detect is  250  copies / mL. A negative result does not preclude SARS-CoV-2 infection  and should not be used as the sole basis for treatment or other  patient management decisions.  A negative result may occur with  improper specimen collection / handling, submission of specimen other  than nasopharyngeal swab, presence of viral mutation(s) within the  areas targeted by this assay, and inadequate number of viral copies  (<250 copies / mL). A negative result must be combined with clinical  observations, patient history, and epidemiological information. If result is POSITIVE SARS-CoV-2 target nucleic acids are DETECTED. The SARS-CoV-2 RNA is generally detectable in upper and lower  respiratory specimens dur ing the acute phase of infection.  Positive  results are indicative of active infection with SARS-CoV-2.   Clinical  correlation with patient history and other diagnostic information is  necessary to determine patient infection status.  Positive results do  not rule out bacterial infection or co-infection with other viruses. If result is PRESUMPTIVE POSTIVE SARS-CoV-2 nucleic acids MAY BE PRESENT.   A presumptive positive result was obtained on the submitted specimen  and confirmed on repeat testing.  While 04-08-2017 novel coronavirus  (SARS-CoV-2) nucleic acids may be present in the submitted sample  additional confirmatory testing may be necessary for epidemiological  and / or clinical management purposes  to differentiate between  SARS-CoV-2 and other Sarbecovirus currently known to infect humans.  If clinically indicated additional testing with an alternate test  methodology [id number]) is advised. The SARS-CoV-2 RNA is generally  detectable in upper and lower respiratory sp ecimens during the acute  phase of infection. The expected result is Negative. Fact Sheet for Patients:  StrictlyIdeas.no Fact Sheet for Healthcare Providers: BankingDealers.co.za This test is not yet approved or cleared by the [place] FDA and has been authorized for detection and/or diagnosis of SARS-CoV-2 by FDA under an Emergency Use Authorization (EUA).  This EUA will remain in effect (meaning this test can be used) for the duration of the COVID-19 declaration under Section 564(b)(1) of the Act, 21 U.S.C. section 360bbb-3(b)(1), unless the authorization is terminated or revoked sooner. Performed at [facility], [address] [place], [place] [address]     Coagulation Studies: Recent Labs    09/13/18 04/08/01  LABPROT 15.9*  INR 1.3*    Urinalysis: No results for input(s): COLORURINE, LABSPEC, PHURINE, GLUCOSEU, HGBUR, BILIRUBINUR, KETONESUR, PROTEINUR, UROBILINOGEN, NITRITE, LEUKOCYTESUR in the last 72 hours.  Invalid input(s): APPERANCEUR     Imaging: Nm Pet Image Initial (pi) Skull Base To Thigh  Result Date: 09/15/2018 CLINICAL DATA:  Initial treatment strategy for lung mass. EXAM: NUCLEAR MEDICINE PET SKULL BASE TO THIGH TECHNIQUE: 14.0 mCi F-18 FDG was injected intravenously. Full-ring PET imaging was performed from the skull base to thigh after the radiotracer. CT data was obtained and used for attenuation correction and anatomic localization. Fasting blood glucose: 124 mg/dl COMPARISON:  Chest CT 09/11/2018 FINDINGS: Mediastinal blood pool activity: SUV max 2.4 Liver activity: SUV max NA NECK: No hypermetabolic lymph nodes in the neck. Incidental CT findings: none CHEST: 6.8 x 5.1 cm soft tissue mass is identified in the infrahilar aspect of the medial right lower lobe with extension of soft tissue into the mediastinum/subcarinal station. This lesion is markedly hypermetabolic with SUV max = 62.0. No definite evidence for contralateral hypermetabolic hilar lymphadenopathy. No hypermetabolic lymphadenopathy in the axillary regions. Hypermetabolic soft tissue is identified in the region of the right diaphragmatic crus/esophageal hiatus and extending posterolaterally on the right.  This abnormal soft tissue is markedly hypermetabolic with SUV max = 16.6. The portion of this mass anterior to the descending aorta on 01/30 2/3 measures 4.1 x 6.2 cm. I think the esophagus is deflected to the left of this lesion, but anatomy in this region is poorly demonstrated on noncontrast imaging. I cannot exclude that this lesion arises from the esophagus. Incidental CT findings: Left-sided permanent pacemaker noted. The heart is enlarged. Small pericardial effusion is similar to prior. Coronary artery calcification is evident. Atherosclerotic calcification is noted in the wall of the thoracic aorta. Bilateral collapse/consolidation noted in the lower lungs with small to moderate bilateral pleural effusions. ABDOMEN/PELVIS: Uptake in the liver parenchyma is  very mottled which could obscure hypermetabolic small liver lesions. There is a subtle hypoattenuating focus in the lateral segment left liver (149/3) that shows a more prominent degree of FDG accumulation ( SUV max = 6.1.) than other areas of the liver, suggesting that this represents a real hypermetabolic liver lesion/metastasis. 2.4 cm lymph node just cranial to the celiac axis is hypermetabolic with SUV max = 06.3. 1.6 cm soft tissue nodule lateral to the lower pole left kidney shows no hypermetabolism. Incidental CT findings: Nodular liver contour compatible with cirrhosis. There is abdominal aortic atherosclerosis without aneurysm. SKELETON: Diffusely mottled uptake identified in the marrow space with focal hypermetabolism identified in the posterior cortex of the proximal right femur ( SUV max = 7.8). This corresponds to a subtle area of cortical lucency visible on 269/3. Incidental CT findings: Degenerative changes noted in the shoulders bilaterally. IMPRESSION: 1. The infrahilar right lung lesion, filling the as ago esophageal recess extending up into the subcarinal region is markedly hypermetabolic consistent with neoplasm. Bulky soft tissue lesions adjacent/involving the distal esophagus and extending posteriorly along the right diaphragmatic crus are not well demonstrated on this noncontrast exam, but are markedly hypermetabolic. This disease appears to deflect the esophagus to the left, but esophageal involvement/origin of the neoplasm not excluded. 2. Probable hypermetabolic metastasis in the lateral segment left liver, poorly demonstrated on noncontrast CT imaging. 3. 2.4 cm hypermetabolic lymph node just cranial to the celiac axis consistent with metastatic involvement. 4. Hypermetabolic lucent lesion in the posterior cortex of the proximal right femur, concerning for metastatic involvement. 5. Bibasilar collapse/consolidation in the lungs with small to moderate bilateral pleural effusions. 6.  Nodular hepatic contour consistent with cirrhosis. 7. Small pericardial effusion. 8.  Aortic Atherosclerois (ICD10-170.0) Electronically Signed   By: Misty Stanley M.D.   On: 09/15/2018 14:14   Dg Chest Port 1 View  Result Date: 09/15/2018 CLINICAL DATA:  85 year old male patient non-smoker/mild CKD is currently admitted to hospital for worsening insufficiency/right lower lobe lung mass/bulky mediastinal adenopathy. Hx of HTN and DM. Hx of pacemaker. EXAM: PORTABLE CHEST 1 VIEW COMPARISON:  09/13/2018 and earlier exams. FINDINGS: Bilateral pleural effusions obscure the hemidiaphragms, extending into the oblique fissure on the right, similar on the left and mildly increased on the right from the most recent prior exam. Associated basilar lung opacity is consistent with atelectasis, similar to the prior study. Upper lungs remain clear. There is no evidence of pulmonary edema. Cardiac silhouette is mildly enlarged, stable. Stable left anterior chest wall sequential pacemaker. No pneumothorax. IMPRESSION: 1. Mild interval increase in the size of the right pleural effusion with stable appearance of the left pleural effusion. Stable associated lung base atelectasis. 2. No convincing pneumonia or pulmonary edema. 3. No change in the enlargement of the cardiopericardial silhouette. Electronically Signed  By: Lajean Manes M.D.   On: 09/15/2018 13:19     Medications:    . docusate sodium  100 mg Oral BID  . feeding supplement (ENSURE ENLIVE)  237 mL Oral BID BM  . heparin  5,000 Units Subcutaneous Q8H  . insulin aspart  0-9 Units Subcutaneous TID WC  . multivitamin with minerals  1 tablet Oral Daily  . pantoprazole  40 mg Oral Daily  . polyethylene glycol  17 g Oral BID  . predniSONE  40 mg Oral Q breakfast  . sodium chloride flush  10 mL Intravenous Q12H   acetaminophen **OR** acetaminophen, bisacodyl, calcium carbonate, guaiFENesin-dextromethorphan, hydrALAZINE, HYDROcodone-acetaminophen,  ipratropium-albuterol, LORazepam, ondansetron **OR** ondansetron (ZOFRAN) IV  Assessment/ Plan:  Javier Wong is a 85 y.o. white male withhypertension, hyperlipidemia, history of complete heart block status post dual chamber pacemaker placement, diabetes mellitus type II, gout, who was admitted to [facility] on7/23/2020. Found to have right lung mass.   1. Acute renal failure with hyperkalemia and metabolic acidosis: on chronic kidney disease stage III with proteinuria:  baseline creatinine 1.5, EGFR 45 on 05/20/2018. Chronic kidney disease secondary to NSAIDs and diabetes Acute renal failure secondary to acute cardiorenal syndrome.  Required lokelma due to hyperkalemia.  - Discontinued naproxen on admission.  - holding ramipril, hydrochlorothiazide and furosemide.  - labs tomorrow  2. Pericardial effusion: no acute tamponade physiology. No evidence of uremia Echocardiogram reviewed    3. Hypertension: stable at 144/75. Home regimen of ramipril, hydrochlorothiazide and furosemide. Holding due to acute renal failure - Start PRN IV hydralazine.  - IV furosemide as needed.   4. Right lower lobe lung mass.  - work up as per pulmonology and heme/onc.  Ultrasound guided biopsy to be scheduled.    LOS: [place] 7/29/202011:51 AM

## 2018-09-16 NOTE — Progress Notes (Signed)
Patient Name: Javier Wong Date of Encounter: 09/16/2018  Hospital Problem List     Active Problems:   AKI (acute kidney injury) ([place])   Pericardial effusion   Mass of lower lobe of right lung    Patient Profile     85 year old male admitted with acute kidney injury, moderate pericardial effusion and shortness of breath.  Noted to have right lower lobe mass.  Also has atrial fibrillation.  Subjective   Short of breath somewhat despondent due to the diagnosis.  Inpatient Medications    . docusate sodium  100 mg Oral BID  . feeding supplement (ENSURE ENLIVE)  237 mL Oral BID BM  . heparin  5,000 Units Subcutaneous Q8H  . insulin aspart  0-9 Units Subcutaneous TID WC  . multivitamin with minerals  1 tablet Oral Daily  . pantoprazole  40 mg Oral Daily  . polyethylene glycol  17 g Oral BID  . predniSONE  40 mg Oral Q breakfast  . sodium chloride flush  10 mL Intravenous Q12H    Vital Signs    Vitals:   09/15/18 1725 09/15/18 1926 09/16/18 0328 09/16/18 0738  BP: 139/70 124/60 130/75 (!) 144/75  Pulse: (!) 54 (!) 57 (!) 57 (!) 56  Resp:      Temp: (!) 97.4 F (36.3 C) 98.6 F (37 C) (!) 97.5 F (36.4 C) 97.8 F (36.6 C)  TempSrc: Oral Oral Oral Oral  SpO2: 98% 98% 94% 100%  Weight:   112.8 kg   Height:        Intake/Output Summary (Last 24 hours) at 09/16/2018 1257 Last data filed at 09/16/2018 1116 Gross per 24 hour  Intake 370 ml  Output 950 ml  Net -580 ml   Filed Weights   09/14/18 0339 09/15/18 0234 09/16/18 0328  Weight: 115.4 kg 113.7 kg 112.8 kg    Physical Exam    GEN: Well nourished, well developed, in no acute distress.  HEENT: normal.  Neck: Supple, no JVD, carotid bruits, or masses. Cardiac: RRR, no murmurs, rubs, or gallops. No clubbing, cyanosis, edema.  Radials/DP/PT 2+ and equal bilaterally.  Respiratory:  Respirations regular and unlabored, clear to auscultation bilaterally. GI: Soft, nontender, nondistended, BS + x 4. MS: no  deformity or atrophy. Skin: warm and dry, no rash. Neuro:  Strength and sensation are intact. Psych: Normal affect.  Labs    CBC Recent Labs    09/14/18 0743  WBC 10.5  HGB 13.0  HCT 39.7  MCV 92.1  PLT 762   Basic Metabolic Panel Recent Labs    09/14/18 0743 09/15/18 0309  NA 136 138  K 3.7 3.7  CL 98 97*  CO2 26 29  GLUCOSE 150* 160*  BUN 43* 38*  CREATININE 1.83* 1.64*  CALCIUM 8.7* 8.9   Liver Function Tests No results for input(s): AST, ALT, ALKPHOS, BILITOT, PROT, ALBUMIN in the last 72 hours. No results for input(s): LIPASE, AMYLASE in the last 72 hours. Cardiac Enzymes No results for input(s): CKTOTAL, CKMB, CKMBINDEX, TROPONINI in the last 72 hours. BNP No results for input(s): BNP in the last 72 hours. D-Dimer No results for input(s): DDIMER in the last 72 hours. Hemoglobin A1C No results for input(s): HGBA1C in the last 72 hours. Fasting Lipid Panel No results for input(s): CHOL, HDL, LDLCALC, TRIG, CHOLHDL, LDLDIRECT in the last 72 hours. Thyroid Function Tests No results for input(s): TSH, T4TOTAL, T3FREE, THYROIDAB in the last 72 hours.  Invalid input(s): [employer]  Telemetry  Atrial fibrillation  ECG    Atrial fibrillation with no ischemia  Radiology    Ct Chest Wo Contrast  Result Date: 09/11/2018 CLINICAL DATA:  Increasing weakness, fatigue and nausea. EXAM: CT CHEST WITHOUT CONTRAST TECHNIQUE: Multidetector CT imaging of the chest was performed following the standard protocol without IV contrast. COMPARISON:  Chest x-ray 09/10/2018 FINDINGS: Cardiovascular: The heart is normal in size for the patient's age. There is a moderate to large pericardial effusion. This appears to be simple fluid. The pacer wires are in good position without complicating features. There is mild tortuosity, ectasia and moderate atherosclerotic calcifications involving the thoracic aorta. Three-vessel coronary artery calcifications are noted. Mediastinum/Nodes:  There is a very large subcarinal mass measuring at least 7.7 x 4.9 x 5.1 cm. It appears to be contiguous with a right lower lobe soft tissue mass in the as ago esophageal recess. This also continues down to the diaphragmatic crus and there is adjacent probable nodal disease just above the celiac axis. A few other small scattered upper mediastinal lymph nodes. I do not see any definite direct involvement of the esophagus. There is some high attenuation material the esophagus which could be debris or pills. Lungs/Pleura: Suspect right lower lobe mass in the as ago esophageal recess contiguous with the large mediastinal mass or adenopathy. Moderate bilateral pleural effusions along with fluid in both major fissures. There is an ill-defined 16 mm lesion in the right middle lobe which is indeterminate. Moderate vascular congestion and probable mild perihilar pulmonary edema. Upper Abdomen: Advanced cirrhotic changes involving the liver with a very irregular liver contour, dilated hepatic fissures and increased caudate to right lobe ratio. No obvious hepatic lesions without contrast. No splenomegaly. 2 cm nodal lesion noted just above the celiac axis and adjacent to the caudate lobe of the liver. Musculoskeletal: Indeterminate subcutaneous lesion involving the posterior lower thorax. Smaller adjacent lesion is also indeterminate. These could be benign complex sebaceous cysts. Mild symmetric bilateral gynecomastia. Left-sided permanent pacemaker without complicating features. Advanced degenerative changes involving the spine but no worrisome bone lesions. IMPRESSION: 1. 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 PET-CT or biopsy may be helpful for further evaluation. 2. Moderate-sized bilateral pleural effusions. 3. Moderate to large pericardial effusion. 4. Indeterminate 16 mm nodule in the  right middle lobe, possibly a metastatic focus. 5. Advanced cirrhotic changes involving the liver without definite hepatic lesions. Aortic Atherosclerosis (ICD10-I70.0). Electronically Signed   By: Marijo Sanes M.D.   On: 09/11/2018 13:51   US Renal  Result Date: 09/11/2018 CLINICAL DATA:  Acute kidney injury EXAM: RENAL / URINARY TRACT ULTRASOUND COMPLETE COMPARISON:  None. FINDINGS: Right Kidney: Renal measurements: 11.0 x 5.4 x 5.1 cm = volume: 159 mL. Slightly increased echotexture diffusely. Cortical thinning. No mass or hydronephrosis Left Kidney: Renal measurements: 11.2 x 5.9 x 5.7 cm = volume: 200 mL. Slightly increased echotexture diffusely with cortical thinning. No mass or hydronephrosis. Bladder: Appears normal for degree of bladder distention. Small amount of ascites seen within the abdomen. IMPRESSION: Increased echotexture and cortical thinning compatible with chronic medical renal disease. No acute findings. No hydronephrosis. Small amount of ascites incidentally noted. Electronically Signed   By: Rolm Baptise M.D.   On: 09/11/2018 10:52   Nm Pet Image Initial (pi) Skull Base To Thigh  Result Date: 09/15/2018 CLINICAL DATA:  Initial treatment strategy for  lung mass. EXAM: NUCLEAR MEDICINE PET SKULL BASE TO THIGH TECHNIQUE: 14.0 mCi F-18 FDG was injected intravenously. Full-ring PET imaging was performed from the skull base to thigh after the radiotracer. CT data was obtained and used for attenuation correction and anatomic localization. Fasting blood glucose: 124 mg/dl COMPARISON:  Chest CT 09/11/2018 FINDINGS: Mediastinal blood pool activity: SUV max 2.4 Liver activity: SUV max NA NECK: No hypermetabolic lymph nodes in the neck. Incidental CT findings: none CHEST: 6.8 x 5.1 cm soft tissue mass is identified in the infrahilar aspect of the medial right lower lobe with extension of soft tissue into the mediastinum/subcarinal station. This lesion is markedly hypermetabolic with SUV max =  06.2. No definite evidence for contralateral hypermetabolic hilar lymphadenopathy. No hypermetabolic lymphadenopathy in the axillary regions. Hypermetabolic soft tissue is identified in the region of the right diaphragmatic crus/esophageal hiatus and extending posterolaterally on the right. This abnormal soft tissue is markedly hypermetabolic with SUV max = 37.6. The portion of this mass anterior to the descending aorta on 01/30 2/3 measures 4.1 x 6.2 cm. I think the esophagus is deflected to the left of this lesion, but anatomy in this region is poorly demonstrated on noncontrast imaging. I cannot exclude that this lesion arises from the esophagus. Incidental CT findings: Left-sided permanent pacemaker noted. The heart is enlarged. Small pericardial effusion is similar to prior. Coronary artery calcification is evident. Atherosclerotic calcification is noted in the wall of the thoracic aorta. Bilateral collapse/consolidation noted in the lower lungs with small to moderate bilateral pleural effusions. ABDOMEN/PELVIS: Uptake in the liver parenchyma is very mottled which could obscure hypermetabolic small liver lesions. There is a subtle hypoattenuating focus in the lateral segment left liver (149/3) that shows a more prominent degree of FDG accumulation ( SUV max = 6.1.) than other areas of the liver, suggesting that this represents a real hypermetabolic liver lesion/metastasis. 2.4 cm lymph node just cranial to the celiac axis is hypermetabolic with SUV max = 28.3. 1.6 cm soft tissue nodule lateral to the lower pole left kidney shows no hypermetabolism. Incidental CT findings: Nodular liver contour compatible with cirrhosis. There is abdominal aortic atherosclerosis without aneurysm. SKELETON: Diffusely mottled uptake identified in the marrow space with focal hypermetabolism identified in the posterior cortex of the proximal right femur ( SUV max = 7.8). This corresponds to a subtle area of cortical lucency visible  on 269/3. Incidental CT findings: Degenerative changes noted in the shoulders bilaterally. IMPRESSION: 1. The infrahilar right lung lesion, filling the as ago esophageal recess extending up into the subcarinal region is markedly hypermetabolic consistent with neoplasm. Bulky soft tissue lesions adjacent/involving the distal esophagus and extending posteriorly along the right diaphragmatic crus are not well demonstrated on this noncontrast exam, but are markedly hypermetabolic. This disease appears to deflect the esophagus to the left, but esophageal involvement/origin of the neoplasm not excluded. 2. Probable hypermetabolic metastasis in the lateral segment left liver, poorly demonstrated on noncontrast CT imaging. 3. 2.4 cm hypermetabolic lymph node just cranial to the celiac axis consistent with metastatic involvement. 4. Hypermetabolic lucent lesion in the posterior cortex of the proximal right femur, concerning for metastatic involvement. 5. Bibasilar collapse/consolidation in the lungs with small to moderate bilateral pleural effusions. 6. Nodular hepatic contour consistent with cirrhosis. 7. Small pericardial effusion. 8.  Aortic Atherosclerois (ICD10-170.0) Electronically Signed   By: Misty Stanley M.D.   On: 09/15/2018 14:14   Dg Chest Port 1 View  Result Date: 09/15/2018 CLINICAL DATA:  85 year old male  patient non-smoker/mild CKD is currently admitted to hospital for worsening insufficiency/right lower lobe lung mass/bulky mediastinal adenopathy. Hx of HTN and DM. Hx of pacemaker. EXAM: PORTABLE CHEST 1 VIEW COMPARISON:  09/13/2018 and earlier exams. FINDINGS: Bilateral pleural effusions obscure the hemidiaphragms, extending into the oblique fissure on the right, similar on the left and mildly increased on the right from the most recent prior exam. Associated basilar lung opacity is consistent with atelectasis, similar to the prior study. Upper lungs remain clear. There is no evidence of pulmonary  edema. Cardiac silhouette is mildly enlarged, stable. Stable left anterior chest wall sequential pacemaker. No pneumothorax. IMPRESSION: 1. Mild interval increase in the size of the right pleural effusion with stable appearance of the left pleural effusion. Stable associated lung base atelectasis. 2. No convincing pneumonia or pulmonary edema. 3. No change in the enlargement of the cardiopericardial silhouette. Electronically Signed   By: Lajean Manes M.D.   On: 09/15/2018 13:19   Dg Chest Port 1 View  Result Date: 09/13/2018 CLINICAL DATA:  85 year old male with history of shortness of breath today. EXAM: PORTABLE CHEST 1 VIEW COMPARISON:  Chest x-ray 09/10/2018. FINDINGS: Small bilateral pleural effusions. Bibasilar opacities which may reflect areas of atelectasis and/or consolidation. Cephalization of the pulmonary vasculature. Enlargement of the cardiopericardial silhouette. Upper mediastinal contours are within normal limits. Aortic atherosclerosis. Left-sided pacemaker with lead tips projecting over the expected location of the right atrium and right ventricle. IMPRESSION: 1. Enlargement of the cardiopericardial silhouette related to known pericardial effusion (better demonstrated on recent chest CT). 2. Small bilateral pleural effusions with bibasilar opacities favored to reflect areas of atelectasis and/or consolidation. 3. Pulmonary venous congestion, without frank pulmonary edema. Electronically Signed   By: Vinnie Langton M.D.   On: 09/13/2018 09:25   Dg Chest Portable 1 View  Result Date: 09/10/2018 CLINICAL DATA:  Shortness of breath. EXAM: PORTABLE CHEST 1 VIEW COMPARISON:  Radiograph of April 06, 2010. FINDINGS: Stable cardiomegaly. Left-sided pacemaker is unchanged in position. No pneumothorax is noted. No significant pleural effusion is noted. Minimal bibasilar subsegmental atelectasis is noted. Degenerative changes are seen involving both glenohumeral joints. IMPRESSION: Minimal  bibasilar subsegmental atelectasis. Electronically Signed   By: Marijo Conception M.D.   On: 09/10/2018 14:47    Assessment & Plan    Pericardial effusion-may be malignant.  Work-up of right lower lobe lung mass undergoing.  Atrial fibrillation-rate is moderately controlled.  Has dual-chamber pacemaker.  Has mode switch of his ventricular backup pacing.  Not on anticoagulation at present due to pending biopsies  Lung mass-appreciate oncology assistance  Acute kidney injury-has improved somewhat.    Signed, Javier Docker Janeli Lewison MD 09/16/2018, 12:57 PM  Pager: [phone]

## 2018-09-16 NOTE — Progress Notes (Signed)
Javier Wong   DOB:November 18, 1932   PY#:[id number]    Subjective: Patient states to have a " lousy" night because of frequent interruptions of sleep.  Denies any pain.  No nausea vomiting.  Shortness of breath with exertion.  Objective:  Vitals:   09/16/18 0738 09/16/18 2039  BP: (!) 144/75 128/73  Pulse: (!) 56 (!) 111  Resp:  16  Temp: 97.8 F (36.6 C) 98.8 F (37.1 C)  SpO2: 100% 94%     Intake/Output Summary (Last 24 hours) at 09/16/2018 2043 Last data filed at 09/16/2018 2040 Gross per 24 hour  Intake 370 ml  Output 800 ml  Net -430 ml    Physical Exam  Constitutional: He is oriented to person, place, and time and well-developed, well-nourished, and in no distress.  Morbidly obese patient.  Resting in bed comfortably.  3 L of oxygen  HENT:  Head: Normocephalic and atraumatic.  Mouth/Throat: Oropharynx is clear and moist. No oropharyngeal exudate.  Eyes: Pupils are equal, round, and reactive to light.  Neck: Normal range of motion. Neck supple.  Cardiovascular: Normal rate and regular rhythm.  Pulmonary/Chest: No respiratory distress. He has no wheezes.  Decreased air entry bilaterally.  Abdominal: Soft. Bowel sounds are normal. He exhibits no distension and no mass. There is no abdominal tenderness. There is no rebound and no guarding.  Musculoskeletal: Normal range of motion.        General: Edema present. No tenderness.  Neurological: He is alert and oriented to person, place, and time.  Skin: Skin is warm.  Psychiatric: Affect normal.     Labs:  Lab Results  Component Value Date   WBC 10.5 09/14/2018   HGB 13.0 09/14/2018   HCT 39.7 09/14/2018   MCV 92.1 09/14/2018   PLT 284 09/14/2018   NEUTROABS 12.9 (H) 09/10/2018    Lab Results  Component Value Date   NA 138 09/15/2018   K 3.7 09/15/2018   CL 97 (L) 09/15/2018   CO2 29 09/15/2018    Studies:  Nm Pet Image Initial (pi) Skull Base To Thigh  Result Date: 09/15/2018 CLINICAL DATA:  Initial  treatment strategy for lung mass. EXAM: NUCLEAR MEDICINE PET SKULL BASE TO THIGH TECHNIQUE: 14.0 mCi F-18 FDG was injected intravenously. Full-ring PET imaging was performed from the skull base to thigh after the radiotracer. CT data was obtained and used for attenuation correction and anatomic localization. Fasting blood glucose: 124 mg/dl COMPARISON:  Chest CT 09/11/2018 FINDINGS: Mediastinal blood pool activity: SUV max 2.4 Liver activity: SUV max NA NECK: No hypermetabolic lymph nodes in the neck. Incidental CT findings: none CHEST: 6.8 x 5.1 cm soft tissue mass is identified in the infrahilar aspect of the medial right lower lobe with extension of soft tissue into the mediastinum/subcarinal station. This lesion is markedly hypermetabolic with SUV max = 12.4. No definite evidence for contralateral hypermetabolic hilar lymphadenopathy. No hypermetabolic lymphadenopathy in the axillary regions. Hypermetabolic soft tissue is identified in the region of the right diaphragmatic crus/esophageal hiatus and extending posterolaterally on the right. This abnormal soft tissue is markedly hypermetabolic with SUV max = 58.0. The portion of this mass anterior to the descending aorta on 01/30 2/3 measures 4.1 x 6.2 cm. I think the esophagus is deflected to the left of this lesion, but anatomy in this region is poorly demonstrated on noncontrast imaging. I cannot exclude that this lesion arises from the esophagus. Incidental CT findings: Left-sided permanent pacemaker noted. The heart is enlarged. Small pericardial effusion  is similar to prior. Coronary artery calcification is evident. Atherosclerotic calcification is noted in the wall of the thoracic aorta. Bilateral collapse/consolidation noted in the lower lungs with small to moderate bilateral pleural effusions. ABDOMEN/PELVIS: Uptake in the liver parenchyma is very mottled which could obscure hypermetabolic small liver lesions. There is a subtle hypoattenuating focus in  the lateral segment left liver (149/3) that shows a more prominent degree of FDG accumulation ( SUV max = 6.1.) than other areas of the liver, suggesting that this represents a real hypermetabolic liver lesion/metastasis. 2.4 cm lymph node just cranial to the celiac axis is hypermetabolic with SUV max = 57.8. 1.6 cm soft tissue nodule lateral to the lower pole left kidney shows no hypermetabolism. Incidental CT findings: Nodular liver contour compatible with cirrhosis. There is abdominal aortic atherosclerosis without aneurysm. SKELETON: Diffusely mottled uptake identified in the marrow space with focal hypermetabolism identified in the posterior cortex of the proximal right femur ( SUV max = 7.8). This corresponds to a subtle area of cortical lucency visible on 269/3. Incidental CT findings: Degenerative changes noted in the shoulders bilaterally. IMPRESSION: 1. The infrahilar right lung lesion, filling the as ago esophageal recess extending up into the subcarinal region is markedly hypermetabolic consistent with neoplasm. Bulky soft tissue lesions adjacent/involving the distal esophagus and extending posteriorly along the right diaphragmatic crus are not well demonstrated on this noncontrast exam, but are markedly hypermetabolic. This disease appears to deflect the esophagus to the left, but esophageal involvement/origin of the neoplasm not excluded. 2. Probable hypermetabolic metastasis in the lateral segment left liver, poorly demonstrated on noncontrast CT imaging. 3. 2.4 cm hypermetabolic lymph node just cranial to the celiac axis consistent with metastatic involvement. 4. Hypermetabolic lucent lesion in the posterior cortex of the proximal right femur, concerning for metastatic involvement. 5. Bibasilar collapse/consolidation in the lungs with small to moderate bilateral pleural effusions. 6. Nodular hepatic contour consistent with cirrhosis. 7. Small pericardial effusion. 8.  Aortic Atherosclerois  (ICD10-170.0) Electronically Signed   By: Misty Stanley M.D.   On: 09/15/2018 14:14   Dg Chest Port 1 View  Result Date: 09/15/2018 CLINICAL DATA:  85 year old male patient non-smoker/mild CKD is currently admitted to hospital for worsening insufficiency/right lower lobe lung mass/bulky mediastinal adenopathy. Hx of HTN and DM. Hx of pacemaker. EXAM: PORTABLE CHEST 1 VIEW COMPARISON:  09/13/2018 and earlier exams. FINDINGS: Bilateral pleural effusions obscure the hemidiaphragms, extending into the oblique fissure on the right, similar on the left and mildly increased on the right from the most recent prior exam. Associated basilar lung opacity is consistent with atelectasis, similar to the prior study. Upper lungs remain clear. There is no evidence of pulmonary edema. Cardiac silhouette is mildly enlarged, stable. Stable left anterior chest wall sequential pacemaker. No pneumothorax. IMPRESSION: 1. Mild interval increase in the size of the right pleural effusion with stable appearance of the left pleural effusion. Stable associated lung base atelectasis. 2. No convincing pneumonia or pulmonary edema. 3. No change in the enlargement of the cardiopericardial silhouette. Electronically Signed   By: Lajean Manes M.D.   On: 09/15/2018 13:19    Mass of lower lobe of right lung 85 year old male patient non-smoker/mild CKD is currently admitted to hospital for worsening insufficiency/right lower lobe lung mass/bulky mediastinal adenopathy  #Right lower lobe lung mass/bulky mediastinal adenopathy/pericardial effusion-highly suspicious for malignant process.  Reviewed the PET scan results with the patient-right infrahilar mediastinal adenopathy/bulky soft tissue masses adjacent to distal esophagus/along the diaphragmatic crus; right femoral lesion;  possible liver lesion.  Discussed with patient that this is malignancy until proven otherwise.;  And unfortunately appears to be stage IV/incurable.  I discussed with  Dr. Jarvis Newcomer who recommends liver ultrasound for further evaluation/liver biopsy.  We will make the patient n.p.o./order liver biopsy for tomorrow.  If unable to perform liver biopsy-recommend biopsy through bronchoscopy/EBUS; discussed with pulmonary.  #Chronic kidney disease/acute renal failure-Baseline creatinine 1.5.  Improving at 1.6.  Nephrology following.  #Discussed with patient's son Jonathon Jordan [[place]]-updated the results of the PET scan; suggestive of stage IV cancer.  However, treatment prognosis/based upon pathology.    Cammie Sickle, MD 09/16/2018  8:43 PM

## 2018-09-16 NOTE — Progress Notes (Signed)
Pt was changed to DNR today. Pt does not want to wear DNR bracelet. MD aware and said that this was ok. Will continue to monitor.

## 2018-09-16 NOTE — Progress Notes (Signed)
PT Cancellation Note  Patient Details Name: Javier Wong MRN: [id number] DOB: Nov 27, 1932   Cancelled Treatment:    Reason Eval/Treat Not Completed: Patient at procedure or test/unavailable.  Pt currently with ultrasound.  Per nursing,he has been in poor spirits today and has not eaten much.  Will re-attempt tomorrow.  Roxanne Gates, PT, DPT  Roxanne Gates 09/16/2018, 3:08 PM

## 2018-09-16 NOTE — Progress Notes (Signed)
Advance care planning  Purpose of Encounter Right lung mass, Pericardial effusion  Parties in Attendance Patient  Patients Decisional capacity Alert and oriented. Able to make medical decisions  Discussed in detail regarding Right lung mass, Pericardial effusion.  Treatment plan , prognosis discussed.  All questions answered.  Discussed regarding CODE STATUS.  Patient tells me he would not want intubation/CPR or defibrillation with cardiac or respiratory arrest.  Explained DNR/DNI status.  Patient agrees.  Orders entered and CODE STATUS changed  DNR/DNI  Time spent - 17 minutes

## 2018-09-17 LAB — CBC
HCT: 44.4 % (ref 39.0–52.0)
Hemoglobin: 14 g/dL (ref 13.0–17.0)
MCH: 29.6 pg (ref 26.0–34.0)
MCHC: 31.5 g/dL (ref 30.0–36.0)
MCV: 93.9 fL (ref 80.0–100.0)
Platelets: 261 10*3/uL (ref 150–400)
RBC: 4.73 MIL/uL (ref 4.22–5.81)
RDW: 14.2 % (ref 11.5–15.5)
WBC: 19.1 10*3/uL — ABNORMAL HIGH (ref 4.0–10.5)
nRBC: 0 % (ref 0.0–0.2)

## 2018-09-17 LAB — GLUCOSE, CAPILLARY
Glucose-Capillary: 154 mg/dL — ABNORMAL HIGH (ref 70–99)
Glucose-Capillary: 183 mg/dL — ABNORMAL HIGH (ref 70–99)
Glucose-Capillary: 228 mg/dL — ABNORMAL HIGH (ref 70–99)
Glucose-Capillary: 141 mg/dL — ABNORMAL HIGH (ref 70–99)

## 2018-09-17 LAB — RENAL FUNCTION PANEL
Albumin: 3.4 g/dL — ABNORMAL LOW (ref 3.5–5.0)
Anion gap: 13 (ref 5–15)
BUN: 39 mg/dL — ABNORMAL HIGH (ref 8–23)
CO2: 27 mmol/L (ref 22–32)
Calcium: 8.9 mg/dL (ref 8.9–10.3)
Chloride: 99 mmol/L (ref 98–111)
Creatinine, Ser: 1.7 mg/dL — ABNORMAL HIGH (ref 0.61–1.24)
GFR calc Af Amer: 42 mL/min — ABNORMAL LOW (ref >60)
GFR calc non Af Amer: 36 mL/min — ABNORMAL LOW (ref >60)
Glucose, Bld: 159 mg/dL — ABNORMAL HIGH (ref 70–99)
Phosphorus: 2.8 mg/dL (ref 2.5–4.6)
Potassium: 3.9 mmol/L (ref 3.5–5.1)
Sodium: 139 mmol/L (ref 135–145)

## 2018-09-17 LAB — PROTIME-INR
INR: 1.2 (ref 0.8–1.2)
Prothrombin Time: 14.9 seconds (ref 11.4–15.2)

## 2018-09-17 MED ORDER — SODIUM CHLORIDE 0.9 % IV SOLN
INTRAVENOUS | Status: DC
  Administered 2018-09-18: 09:00:00 via INTRAVENOUS

## 2018-09-17 MED ORDER — SODIUM CHLORIDE 0.9 % IV SOLN
INTRAVENOUS | Status: DC
  Administered 2018-09-17: 08:00:00 via INTRAVENOUS

## 2018-09-17 NOTE — TOC Progression Note (Signed)
Transition of Care [facility]) - Progression Note    Patient Details  Name: Javier Wong MRN: [id number] Date of Birth: 1932-11-08  Transition of Care [facility]) CM/SW Contact  Cecil Cobbs Phone Number: 09/17/2018, 4:41 PM  Clinical Narrative:     CSW received a phone call from Kingsley Spittle, [place] worker, [phone], she is the assigned worker, and she is planning to see patient in the hospital today.  CSW notified AC in order to get permission for patient to be seen by APS worker.  CSW continuing to follow patient's progress throughout discharge planning, patient still refusing SNF, but would like home health to see him, and he is also requesting a bedside [employer].  Expected Discharge Plan: [facility] Barriers to Discharge: Continued Medical Work up  Expected Discharge Plan and Services Expected Discharge Plan: [facility] In-house Referral: Clinical Social Work     Living arrangements for the past 2 months: Single Family Home                                       Social Determinants of Health (SDOH) Interventions    Readmission Risk Interventions Readmission Risk Prevention Plan 09/14/2018  Medication Screening Complete  Transportation Screening Complete  Some recent data might be hidden

## 2018-09-17 NOTE — Care Management Important Message (Signed)
Important Message  Patient Details  Name: Javier Wong MRN: [id number] Date of Birth: 19-Apr-1932   Medicare Important Message Given:  Yes     Dannette Barbara 09/17/2018, 12:58 PM

## 2018-09-17 NOTE — Progress Notes (Signed)
[facility]  ROUNDING NOTE   Subjective:   Patient with anxiety.   Objective:  Vital signs in last 24 hours:  Temp:  [97.4 F (36.3 C)-98.8 F (37.1 C)] 98 F (36.7 C) (07/30 0756) Pulse Rate:  [86-111] 95 (07/30 0757) Resp:  [16-20] 20 (07/30 0756) BP: (99-128)/(23-78) 121/62 (07/30 0757) SpO2:  [90 %-94 %] 90 % (07/30 0756) Weight:  [111.9 kg] 111.9 kg (07/30 0311)  Weight change: -0.953 kg Filed Weights   09/15/18 0234 09/16/18 0328 09/17/18 0311  Weight: 113.7 kg 112.8 kg 111.9 kg    Intake/Output: I/O last 3 completed shifts: In: 610 [P.O.:600; I.V.:10] Out: 1400 [Urine:1400]   Intake/Output this shift:  No intake/output data recorded.  Physical Exam: General: NAD, laying in bed  Head: Normocephalic, atraumatic. Moist oral mucosal membranes  Eyes: Anicteric, PERRL  Neck: Supple, trachea midline  Lungs:  +wheezing   Heart: Regular rate and rhythm  Abdomen:  Soft, nontender,   Extremities:  trace peripheral edema.  Neurologic: Nonfocal, moving all four extremities  Skin: No lesions        Basic Metabolic Panel: Recent Labs  Lab 09/10/18 1402  09/12/18 0727 09/13/18 1202 09/14/18 0743 09/15/18 0309 09/17/18 0639  NA 130*   < > 131* 134* 136 138 139  K 6.0*   < > 4.7 3.9 3.7 3.7 3.9  CL 97*   < > 95* 97* 98 97* 99  CO2 16*   < > 23 25 26 29 27  GLUCOSE 122*   < > 123* 166* 150* 160* 159*  BUN 70*   < > 72* 61* 43* 38* 39*  CREATININE 2.84*   < > 2.51* 1.99* 1.83* 1.64* 1.70*  CALCIUM 8.8*   < > 8.2* 8.4* 8.7* 8.9 8.9  MG 2.6*  --   --   --   --   --   --   PHOS  --   --   --   --   --   --  2.8   < > = values in this interval not displayed.    Liver Function Tests: Recent Labs  Lab 09/10/18 1402 09/12/18 0727 09/17/18 0639  AST 19 18  --   ALT 14 15  --   ALKPHOS 71 64  --   BILITOT 1.0 0.8  --   PROT 7.7 6.2*  --   ALBUMIN 3.7 3.0* 3.4*   No results for input(s): LIPASE, AMYLASE in the last 168 hours. No results for  input(s): AMMONIA in the last 168 hours.  CBC: Recent Labs  Lab 09/10/18 1402 09/11/18 0608 09/14/18 0743 09/17/18 0639  WBC 14.3* 11.5* 10.5 19.1*  NEUTROABS 12.9*  --   --   --   HGB 13.5 12.1* 13.0 14.0  HCT 41.4 36.7* 39.7 44.4  MCV 91.8 92.0 92.1 93.9  PLT 420* 307 284 261    Cardiac Enzymes: No results for input(s): CKTOTAL, CKMB, CKMBINDEX, TROPONINI in the last 168 hours.  BNP: Invalid input(s): POCBNP  CBG: Recent Labs  Lab 09/16/18 0739 09/16/18 1144 09/16/18 1657 09/16/18 2153 09/17/18 0758  GLUCAP 115* 148* 156* 165* 154*    Microbiology: Results for orders placed or performed during the hospital encounter of 09/10/18  SARS Coronavirus 2 (CEPHEID - Performed in [place] hospital lab), Hosp Order     Status: None   Collection Time: 09/10/18  2:03 PM   Specimen: Nasopharyngeal Swab  Result Value Ref Range Status   SARS Coronavirus   2 NEGATIVE NEGATIVE Final    Comment: (NOTE) If result is NEGATIVE SARS-CoV-2 target nucleic acids are NOT DETECTED. The SARS-CoV-2 RNA is generally detectable in upper and lower  respiratory specimens during the acute phase of infection. The lowest  concentration of SARS-CoV-2 viral copies this assay can detect is 250  copies / mL. A negative result does not preclude SARS-CoV-2 infection  and should not be used as the sole basis for treatment or other  patient management decisions.  A negative result may occur with  improper specimen collection / handling, submission of specimen other  than nasopharyngeal swab, presence of viral mutation(s) within the  areas targeted by this assay, and inadequate number of viral copies  (<250 copies / mL). A negative result must be combined with clinical  observations, patient history, and epidemiological information. If result is POSITIVE SARS-CoV-2 target nucleic acids are DETECTED. The SARS-CoV-2 RNA is generally detectable in upper and lower  respiratory specimens dur ing the  acute phase of infection.  Positive  results are indicative of active infection with SARS-CoV-2.  Clinical  correlation with patient history and other diagnostic information is  necessary to determine patient infection status.  Positive results do  not rule out bacterial infection or co-infection with other viruses. If result is PRESUMPTIVE POSTIVE SARS-CoV-2 nucleic acids MAY BE PRESENT.   A presumptive positive result was obtained on the submitted specimen  and confirmed on repeat testing.  While 2019 novel coronavirus  (SARS-CoV-2) nucleic acids may be present in the submitted sample  additional confirmatory testing may be necessary for epidemiological  and / or clinical management purposes  to differentiate between  SARS-CoV-2 and other Sarbecovirus currently known to infect humans.  If clinically indicated additional testing with an alternate test  methodology ([id number]) is advised. The SARS-CoV-2 RNA is generally  detectable in upper and lower respiratory sp ecimens during the acute  phase of infection. The expected result is Negative. Fact Sheet for Patients:  https://www.fda.gov/media/136312/download Fact Sheet for Healthcare Providers: https://www.fda.gov/media/136313/download This test is not yet approved or cleared by the [place] FDA and has been authorized for detection and/or diagnosis of SARS-CoV-2 by FDA under an Emergency Use Authorization (EUA).  This EUA will remain in effect (meaning this test can be used) for the duration of the COVID-19 declaration under Section 564(b)(1) of the Act, 21 U.S.C. section 360bbb-3(b)(1), unless the authorization is terminated or revoked sooner. Performed at [facility], [address] [place], [place] [address]     Coagulation Studies: Recent Labs    09/17/18 0731  LABPROT 14.9  INR 1.2    Urinalysis: No results for input(s): COLORURINE, LABSPEC, PHURINE, GLUCOSEU, HGBUR, BILIRUBINUR, KETONESUR, PROTEINUR,  UROBILINOGEN, NITRITE, LEUKOCYTESUR in the last 72 hours.  Invalid input(s): APPERANCEUR    Imaging: Nm Pet Image Initial (pi) Skull Base To Thigh  Result Date: 09/15/2018 CLINICAL DATA:  Initial treatment strategy for lung mass. EXAM: NUCLEAR MEDICINE PET SKULL BASE TO THIGH TECHNIQUE: 14.0 mCi F-18 FDG was injected intravenously. Full-ring PET imaging was performed from the skull base to thigh after the radiotracer. CT data was obtained and used for attenuation correction and anatomic localization. Fasting blood glucose: 124 mg/dl COMPARISON:  Chest CT 09/11/2018 FINDINGS: Mediastinal blood pool activity: SUV max 2.4 Liver activity: SUV max NA NECK: No hypermetabolic lymph nodes in the neck. Incidental CT findings: none CHEST: 6.8 x 5.1 cm soft tissue mass is identified in the infrahilar aspect of the medial right lower lobe with extension   of soft tissue into the mediastinum/subcarinal station. This lesion is markedly hypermetabolic with SUV max = 18.2. No definite evidence for contralateral hypermetabolic hilar lymphadenopathy. No hypermetabolic lymphadenopathy in the axillary regions. Hypermetabolic soft tissue is identified in the region of the right diaphragmatic crus/esophageal hiatus and extending posterolaterally on the right. This abnormal soft tissue is markedly hypermetabolic with SUV max = 99.3. The portion of this mass anterior to the descending aorta on 01/30 2/3 measures 4.1 x 6.2 cm. I think the esophagus is deflected to the left of this lesion, but anatomy in this region is poorly demonstrated on noncontrast imaging. I cannot exclude that this lesion arises from the esophagus. Incidental CT findings: Left-sided permanent pacemaker noted. The heart is enlarged. Small pericardial effusion is similar to prior. Coronary artery calcification is evident. Atherosclerotic calcification is noted in the wall of the thoracic aorta. Bilateral collapse/consolidation noted in the lower lungs with small  to moderate bilateral pleural effusions. ABDOMEN/PELVIS: Uptake in the liver parenchyma is very mottled which could obscure hypermetabolic small liver lesions. There is a subtle hypoattenuating focus in the lateral segment left liver (149/3) that shows a more prominent degree of FDG accumulation ( SUV max = 6.1.) than other areas of the liver, suggesting that this represents a real hypermetabolic liver lesion/metastasis. 2.4 cm lymph node just cranial to the celiac axis is hypermetabolic with SUV max = 71.6. 1.6 cm soft tissue nodule lateral to the lower pole left kidney shows no hypermetabolism. Incidental CT findings: Nodular liver contour compatible with cirrhosis. There is abdominal aortic atherosclerosis without aneurysm. SKELETON: Diffusely mottled uptake identified in the marrow space with focal hypermetabolism identified in the posterior cortex of the proximal right femur ( SUV max = 7.8). This corresponds to a subtle area of cortical lucency visible on 269/3. Incidental CT findings: Degenerative changes noted in the shoulders bilaterally. IMPRESSION: 1. The infrahilar right lung lesion, filling the as ago esophageal recess extending up into the subcarinal region is markedly hypermetabolic consistent with neoplasm. Bulky soft tissue lesions adjacent/involving the distal esophagus and extending posteriorly along the right diaphragmatic crus are not well demonstrated on this noncontrast exam, but are markedly hypermetabolic. This disease appears to deflect the esophagus to the left, but esophageal involvement/origin of the neoplasm not excluded. 2. Probable hypermetabolic metastasis in the lateral segment left liver, poorly demonstrated on noncontrast CT imaging. 3. 2.4 cm hypermetabolic lymph node just cranial to the celiac axis consistent with metastatic involvement. 4. Hypermetabolic lucent lesion in the posterior cortex of the proximal right femur, concerning for metastatic involvement. 5. Bibasilar  collapse/consolidation in the lungs with small to moderate bilateral pleural effusions. 6. Nodular hepatic contour consistent with cirrhosis. 7. Small pericardial effusion. 8.  Aortic Atherosclerois (ICD10-170.0) Electronically Signed   By: Misty Stanley M.D.   On: 09/15/2018 14:14   Dg Chest Port 1 View  Result Date: 09/15/2018 CLINICAL DATA:  85 year old male patient non-smoker/mild CKD is currently admitted to hospital for worsening insufficiency/right lower lobe lung mass/bulky mediastinal adenopathy. Hx of HTN and DM. Hx of pacemaker. EXAM: PORTABLE CHEST 1 VIEW COMPARISON:  09/13/2018 and earlier exams. FINDINGS: Bilateral pleural effusions obscure the hemidiaphragms, extending into the oblique fissure on the right, similar on the left and mildly increased on the right from the most recent prior exam. Associated basilar lung opacity is consistent with atelectasis, similar to the prior study. Upper lungs remain clear. There is no evidence of pulmonary edema. Cardiac silhouette is mildly enlarged, stable. Stable left anterior chest  wall sequential pacemaker. No pneumothorax. IMPRESSION: 1. Mild interval increase in the size of the right pleural effusion with stable appearance of the left pleural effusion. Stable associated lung base atelectasis. 2. No convincing pneumonia or pulmonary edema. 3. No change in the enlargement of the cardiopericardial silhouette. Electronically Signed   By: David  Ormond M.D.   On: 09/15/2018 13:19   Us Abdomen Limited Ruq  Result Date: 09/17/2018 CLINICAL DATA:  Infrahilar right lung mass. Hypermetabolic lateral left hepatic lesions suspected on PET-CT. Preop planning for possible biopsy EXAM: ULTRASOUND ABDOMEN LIMITED RIGHT UPPER QUADRANT COMPARISON:  PET-CT 09/15/2018 and previous FINDINGS: Gallbladder: Layering sludge. No wall thickening or pericholecystic fluid. Sonographer describes no sonographic Murphy's sign. Common bile duct: Diameter: 4.4 mm, unremarkable Liver:  2.3 x 2.2 x 2 cm lesion in the lateral left hepatic segment 1.4 x 1.3 cm slightly hyperechoic nodule in hepatic segment 5 2.3 x 2.1 by 2 cm slightly echogenic lesion in the anterior right hepatic lobe. These correspond to hypermetabolic foci on recent PET-CT. Background liver parenchyma is markedly heterogenous. Nodular liver contour. Portal vein is patent on color Doppler imaging with normal direction of blood flow towards the liver. Other: Trace perihepatic ascites IMPRESSION: 1. Liver lesions corresponding to hypermetabolic foci on PET-CT. These should be approachable for ultrasound-guided core liver lesion biopsy. 2. Nodular hepatic contour with heterogenous echotexture consistent with cirrhosis. Electronically Signed   By: D  Hassell M.D.   On: 09/17/2018 08:20     Medications:   . sodium chloride Stopped (09/17/18 1003)   . docusate sodium  100 mg Oral BID  . feeding supplement (ENSURE ENLIVE)  237 mL Oral BID BM  . insulin aspart  0-9 Units Subcutaneous TID WC  . multivitamin with minerals  1 tablet Oral Daily  . pantoprazole  40 mg Oral Daily  . predniSONE  40 mg Oral Q breakfast  . sodium chloride flush  10 mL Intravenous Q12H   acetaminophen **OR** acetaminophen, bisacodyl, calcium carbonate, guaiFENesin-dextromethorphan, hydrALAZINE, HYDROcodone-acetaminophen, ipratropium-albuterol, LORazepam, ondansetron **OR** ondansetron (ZOFRAN) IV  Assessment/ Plan:  Javier Wong is a 86 y.o. white male withhypertension, hyperlipidemia, history of complete heart block status post dual chamber pacemaker placement, diabetes mellitus type II, gout, who was admitted to [facility] on7/23/2020. Found to have right lung mass.   1. Acute renal failure with hyperkalemia and metabolic acidosis: on chronic kidney disease stage III with proteinuria:  baseline creatinine 1.5, EGFR 45 on 05/20/2018. Chronic kidney disease secondary to NSAIDs and diabetes Acute renal failure secondary to acute  cardiorenal syndrome. Peak creatinine of 2.84 GFR of 20 on 7/23 Required lokelma due to hyperkalemia.  - Discontinued naproxen on admission.  - holding ramipril, hydrochlorothiazide and furosemide.  - Recommend no IV contrast exposure as his kidneys are just now recovering.   2. Pericardial effusion: no acute tamponade physiology. No evidence of uremia  3. Hypertension: Home regimen of ramipril, hydrochlorothiazide and furosemide. Holding due to acute renal failure - PRN IV hydralazine.   4. Right lower lobe lung mass.  - work up as per pulmonology and heme/onc.   - Do not recommend IV contrast exposure at this time.    LOS: 7 Sarath Kolluru 7/30/202010:54 AM  

## 2018-09-17 NOTE — Progress Notes (Signed)
[facility] at [place] NAME: Javier Wong    MR#:  [id number]  DATE OF BIRTH:  1932-06-30  SUBJECTIVE:   Feels weak.  Tells me he is scared with his diagnosis.  REVIEW OF SYSTEMS:  Review of Systems  Constitutional: Negative for chills, fever and malaise/fatigue.  HENT: Negative for congestion and sore throat.   Eyes: Negative for blurred vision and double vision.  Respiratory: Positive for shortness of breath. Negative for cough.   Cardiovascular: Negative for chest pain and palpitations.  Gastrointestinal: Negative for abdominal pain, nausea and vomiting.  Genitourinary: Negative for dysuria and urgency.  Musculoskeletal: Negative for back pain and neck pain.  Neurological: Positive for weakness. Negative for dizziness, focal weakness and headaches.  Psychiatric/Behavioral: Negative for depression. The patient is not nervous/anxious.    DRUG ALLERGIES:  No Known Allergies VITALS:  Blood pressure 121/62, pulse 95, temperature 98 F (36.7 C), temperature source Oral, resp. rate 20, height 5\' 11"  (1.803 m), weight 111.9 kg, SpO2 90 %. PHYSICAL EXAMINATION:  Physical Exam  GENERAL:   Denies any complaints. HEENT: Head atraumatic, normoceph and accommodation. No scleral icterus. Extraocular muscles intact. Oropharynx and nasopharynx clear.  NECK:  Supple, no jugular venous distention. No thyroid enlargement. LUNGS -clear to auscultation bilaterally CARDIOVASCULAR: Bradycardia resolved, heart rate 62 bpm. ABDOMEN: Soft, nontender, nondistended. Bowel sounds present.  EXTREMITIES: No pedal edema, cyanosis, or clubbing.  NEUROLOGIC: CN 2-12 intact, no focal deficits.  Sensation intact throughout. Gait not checked.  PSYCHIATRIC: The patient is alert and oriented x 3.   SKIN: No obvious rash, lesion, or ulcer.  LABORATORY PANEL:  Male CBC Recent Labs  Lab 09/17/18 0639  WBC 19.1*  HGB 14.0  HCT 44.4  PLT 261    ------------------------------------------------------------------------------------------------------------------ Chemistries  Recent Labs  Lab 09/10/18 1402  09/12/18 0727  09/17/18 0639  NA 130*   < > 131*   < > 139  K 6.0*   < > 4.7   < > 3.9  CL 97*   < > 95*   < > 99  CO2 16*   < > 23   < > 27  GLUCOSE 122*   < > 123*   < > 159*  BUN 70*   < > 72*   < > 39*  CREATININE 2.84*   < > 2.51*   < > 1.70*  CALCIUM 8.8*   < > 8.2*   < > 8.9  MG 2.6*  --   --   --   --   AST 19  --  18  --   --   ALT 14  --  15  --   --   ALKPHOS 71  --  64  --   --   BILITOT 1.0  --  0.8  --   --    < > = values in this interval not displayed.   RADIOLOGY:  US Abdomen Limited Ruq  Result Date: 09/17/2018 CLINICAL DATA:  Infrahilar right lung mass. Hypermetabolic lateral left hepatic lesions suspected on PET-CT. Preop planning for possible biopsy EXAM: ULTRASOUND ABDOMEN LIMITED RIGHT UPPER QUADRANT COMPARISON:  PET-CT 09/15/2018 and previous FINDINGS: Gallbladder: Layering sludge. No wall thickening or pericholecystic fluid. Sonographer describes no sonographic Murphy's sign. Common bile duct: Diameter: 4.4 mm, unremarkable Liver: 2.3 x 2.2 x 2 cm lesion in the lateral left hepatic segment 1.4 x 1.3 cm slightly hyperechoic nodule in hepatic segment 5 2.3 x 2.1  by 2 cm slightly echogenic lesion in the anterior right hepatic lobe. These correspond to hypermetabolic foci on recent PET-CT. Background liver parenchyma is markedly heterogenous. Nodular liver contour. Portal vein is patent on color Doppler imaging with normal direction of blood flow towards the liver. Other: Trace perihepatic ascites IMPRESSION: 1. Liver lesions corresponding to hypermetabolic foci on PET-CT. These should be approachable for ultrasound-guided core liver lesion biopsy. 2. Nodular hepatic contour with heterogenous echotexture consistent with cirrhosis. Electronically Signed   By: Lucrezia Europe M.D.   On: 09/17/2018 08:20   ASSESSMENT  AND PLAN:   * Right lower lobe lung mass with subcarinal extension Seen by pulmonary and oncology.   PET with Mets for liver and femur Scheduled for US biopsy of liver lesion today  *Acute on chronic diastolic congestive heart failure Improved with IV lasix. Resolved  * Moderate pericardial effusion- seen on echo.  No evidence of tamponade physiology.  Repeat echocardiogram with similar pericardial effusion.  Appreciate cardiology input.  * Acute kidney injury- likely secondary to NSAID use and poor cardiac output.  Renal function is slowly improving, creatinine down from 2.84-->1.83 -->1.6 - Renal ultrasound showed medical renal disease, but no acute abnormalities. - Nephrology consulted and appreciate input.  Discussed with Dr. Juleen China. - Avoid nephrotoxic agents.  *Hyperkalemia secondary to acute kidney injury.  Resolved.  *Atrial flutter found on EKG.  Cardiology following.  Has pacemaker in place.  Not started on anticoagulation due to patient awaiting further treatment plan with biopsy.  * Deconditioning- due to above -PT consulted.  * Type 2 diabetes -Continue SSI  All the records are reviewed and case discussed with Care Management/Social Worker. Management plans discussed with the patient, family and they are in agreement.  CODE STATUS: DNR  TOTAL TIME TAKING CARE OF THIS PATIENT: 35 minutes.   POSSIBLE D/C IN 2-3 DAYS, DEPENDING ON CLINICAL CONDITION.  Leia Alf Reed Dady M.D on 09/17/2018 at 12:45 PM  Between 7am to 6pm - Pager - [phone]  After 6pm go to [email or web address] - Proofreader  [facility]  Office  [phone]  CC: Primary care physician; Glendon Axe, MD  Note: This dictation was prepared with Dragon dictation along with smaller phrase technology. Any transcriptional errors that result from this process are unintentional.

## 2018-09-17 NOTE — Plan of Care (Signed)
  Problem: Education: Goal: Knowledge of disease and its progression will improve Outcome: Progressing   Problem: Health Behavior/Discharge Planning: Goal: Ability to manage health-related needs will improve Outcome: Not Progressing Note: Patient was supposed to have a US-guided liver biopsy today, however, department cancelled d/t time constraints. Will supposedly retry for tomorrow around 10:30 AM. Will continue to monitor testing progression. Wenda Low [facility]

## 2018-09-17 NOTE — Progress Notes (Signed)
Physical Therapy Treatment Patient Details Name: Javier Wong MRN: [id number] DOB: 06/26/1932 Today's Date: 09/17/2018    History of Present Illness Javier Wong is an 12yoM who comes to [facility] after worsening SOB, weakness. Pt clinically dehydrated and in ARF.    PT Comments    Pt initially agreeable to PT; denies pain. Presents fatigued, opening eyes very little. Pt wishes to sit edge of bed and does so with Min a for BLEs and trunk requiring greater assist for trunk to initiate movement. Pt sits edge of bed well, but ultimately declines further intervention for seated exercises or stand/ambulation or up to recliner. Pt wishes to remain sitting edge of bed; instructed to call nursing if needing to get out of bed or return to bed. Pt states understanding and is agreeable. Continue PT to progress participation, endurance and strength to improve functional mobility.    Follow Up Recommendations  SNF     Equipment Recommendations  None recommended by PT    Recommendations for Other Services       Precautions / Restrictions Precautions Precautions: Fall Restrictions Weight Bearing Restrictions: No    Mobility  Bed Mobility Overal bed mobility: Needs Assistance Bed Mobility: Supine to Sit     Supine to sit: Min assist     General bed mobility comments: Assist for trunk and less so for LEs  Transfers                 General transfer comment: declines  Ambulation/Gait                 Stairs             Wheelchair Mobility    Modified Rankin (Stroke Patients Only)       Balance                                            Cognition Arousal/Alertness: Awake/alert Behavior During Therapy: Flat affect Overall Cognitive Status: Within Functional Limits for tasks assessed                                 General Comments: Pt maintains eyes closed primarily and speaks only when spoken to      Exercises  Other Exercises Other Exercises: Static sit with good balance; ultimately declines further exercises    General Comments        Pertinent Vitals/Pain Pain Assessment: No/denies pain    Home Living                      Prior Function            PT Goals (current goals can now be found in the care plan section) Progress towards PT goals: Progressing toward goals(slowly)    Frequency    Min 2X/week      PT Plan Current plan remains appropriate    Co-evaluation              AM-PAC PT "6 Clicks" Mobility   Outcome Measure  Help needed turning from your back to your side while in a flat bed without using bedrails?: A Little Help needed moving from lying on your back to sitting on the side of a flat bed without using bedrails?: A Little Help needed moving to and from  a bed to a chair (including a wheelchair)?: A Little Help needed standing up from a chair using your arms (e.g., wheelchair or bedside chair)?: A Lot Help needed to walk in hospital room?: A Lot Help needed climbing 3-5 steps with a railing? : A Lot 6 Click Score: 15    End of Session   Activity Tolerance: Patient limited by fatigue Patient left: Other (comment);with bed alarm set(sitting edge of bed)   PT Visit Diagnosis: Unsteadiness on feet (R26.81);Repeated falls (R29.6);Difficulty in walking, not elsewhere classified (R26.2);Other abnormalities of gait and mobility (R26.89)     Time: 9295-7473 PT Time Calculation (min) (ACUTE ONLY): 11 min  Charges:  $Therapeutic Activity: 8-22 mins                      Larae Grooms, PTA 09/17/2018, 3:21 PM

## 2018-09-17 NOTE — Progress Notes (Signed)
Javier Wong   DOB:1932/10/23   ON#:[id number]    Subjective: Patient is anxious.  States that he is scared given his is slightly diagnosed with malignancy/and of his prognosis.  He states that he has spoken to his son.  As per the patient son is planned to visit him from [place].   Objective:  Vitals:   09/17/18 0756 09/17/18 0757  BP: (!) 99/23 121/62  Pulse: 99 95  Resp: 20   Temp: 98 F (36.7 C)   SpO2: 90%      Intake/Output Summary (Last 24 hours) at 09/17/2018 0904 Last data filed at 09/17/2018 0500 Gross per 24 hour  Intake 370 ml  Output 1300 ml  Net -930 ml    Physical Exam  Constitutional: He is oriented to person, place, and time and well-developed, well-nourished, and in no distress.  HENT:  Head: Normocephalic and atraumatic.  Mouth/Throat: Oropharynx is clear and moist. No oropharyngeal exudate.  Eyes: Pupils are equal, round, and reactive to light.  Neck: Normal range of motion. Neck supple.  Cardiovascular: Normal rate and regular rhythm.  Pulmonary/Chest: No respiratory distress. He has no wheezes.  Decreased bilateral lung bases.  Abdominal: Soft. Bowel sounds are normal. He exhibits no distension and no mass. There is no abdominal tenderness. There is no rebound and no guarding.  Musculoskeletal: Normal range of motion.        General: No tenderness or edema.  Neurological: He is alert and oriented to person, place, and time.  Skin: Skin is warm.  Psychiatric: Affect normal.     Labs:  Lab Results  Component Value Date   WBC 19.1 (H) 09/17/2018   HGB 14.0 09/17/2018   HCT 44.4 09/17/2018   MCV 93.9 09/17/2018   PLT 261 09/17/2018   NEUTROABS 12.9 (H) 09/10/2018    Lab Results  Component Value Date   NA 139 09/17/2018   K 3.9 09/17/2018   CL 99 09/17/2018   CO2 27 09/17/2018    Studies:  Nm Pet Image Initial (pi) Skull Base To Thigh  Result Date: 09/15/2018 CLINICAL DATA:  Initial treatment strategy for lung mass. EXAM: NUCLEAR  MEDICINE PET SKULL BASE TO THIGH TECHNIQUE: 14.0 mCi F-18 FDG was injected intravenously. Full-ring PET imaging was performed from the skull base to thigh after the radiotracer. CT data was obtained and used for attenuation correction and anatomic localization. Fasting blood glucose: 124 mg/dl COMPARISON:  Chest CT 09/11/2018 FINDINGS: Mediastinal blood pool activity: SUV max 2.4 Liver activity: SUV max NA NECK: No hypermetabolic lymph nodes in the neck. Incidental CT findings: none CHEST: 6.8 x 5.1 cm soft tissue mass is identified in the infrahilar aspect of the medial right lower lobe with extension of soft tissue into the mediastinum/subcarinal station. This lesion is markedly hypermetabolic with SUV max = 24.4. No definite evidence for contralateral hypermetabolic hilar lymphadenopathy. No hypermetabolic lymphadenopathy in the axillary regions. Hypermetabolic soft tissue is identified in the region of the right diaphragmatic crus/esophageal hiatus and extending posterolaterally on the right. This abnormal soft tissue is markedly hypermetabolic with SUV max = 01.0. The portion of this mass anterior to the descending aorta on 01/30 2/3 measures 4.1 x 6.2 cm. I think the esophagus is deflected to the left of this lesion, but anatomy in this region is poorly demonstrated on noncontrast imaging. I cannot exclude that this lesion arises from the esophagus. Incidental CT findings: Left-sided permanent pacemaker noted. The heart is enlarged. Small pericardial effusion is similar to prior.  Coronary artery calcification is evident. Atherosclerotic calcification is noted in the wall of the thoracic aorta. Bilateral collapse/consolidation noted in the lower lungs with small to moderate bilateral pleural effusions. ABDOMEN/PELVIS: Uptake in the liver parenchyma is very mottled which could obscure hypermetabolic small liver lesions. There is a subtle hypoattenuating focus in the lateral segment left liver (149/3) that shows  a more prominent degree of FDG accumulation ( SUV max = 6.1.) than other areas of the liver, suggesting that this represents a real hypermetabolic liver lesion/metastasis. 2.4 cm lymph node just cranial to the celiac axis is hypermetabolic with SUV max = 54.2. 1.6 cm soft tissue nodule lateral to the lower pole left kidney shows no hypermetabolism. Incidental CT findings: Nodular liver contour compatible with cirrhosis. There is abdominal aortic atherosclerosis without aneurysm. SKELETON: Diffusely mottled uptake identified in the marrow space with focal hypermetabolism identified in the posterior cortex of the proximal right femur ( SUV max = 7.8). This corresponds to a subtle area of cortical lucency visible on 269/3. Incidental CT findings: Degenerative changes noted in the shoulders bilaterally. IMPRESSION: 1. The infrahilar right lung lesion, filling the as ago esophageal recess extending up into the subcarinal region is markedly hypermetabolic consistent with neoplasm. Bulky soft tissue lesions adjacent/involving the distal esophagus and extending posteriorly along the right diaphragmatic crus are not well demonstrated on this noncontrast exam, but are markedly hypermetabolic. This disease appears to deflect the esophagus to the left, but esophageal involvement/origin of the neoplasm not excluded. 2. Probable hypermetabolic metastasis in the lateral segment left liver, poorly demonstrated on noncontrast CT imaging. 3. 2.4 cm hypermetabolic lymph node just cranial to the celiac axis consistent with metastatic involvement. 4. Hypermetabolic lucent lesion in the posterior cortex of the proximal right femur, concerning for metastatic involvement. 5. Bibasilar collapse/consolidation in the lungs with small to moderate bilateral pleural effusions. 6. Nodular hepatic contour consistent with cirrhosis. 7. Small pericardial effusion. 8.  Aortic Atherosclerois (ICD10-170.0) Electronically Signed   By: Misty Stanley M.D.    On: 09/15/2018 14:14   Dg Chest Port 1 View  Result Date: 09/15/2018 CLINICAL DATA:  85 year old male patient non-smoker/mild CKD is currently admitted to hospital for worsening insufficiency/right lower lobe lung mass/bulky mediastinal adenopathy. Hx of HTN and DM. Hx of pacemaker. EXAM: PORTABLE CHEST 1 VIEW COMPARISON:  09/13/2018 and earlier exams. FINDINGS: Bilateral pleural effusions obscure the hemidiaphragms, extending into the oblique fissure on the right, similar on the left and mildly increased on the right from the most recent prior exam. Associated basilar lung opacity is consistent with atelectasis, similar to the prior study. Upper lungs remain clear. There is no evidence of pulmonary edema. Cardiac silhouette is mildly enlarged, stable. Stable left anterior chest wall sequential pacemaker. No pneumothorax. IMPRESSION: 1. Mild interval increase in the size of the right pleural effusion with stable appearance of the left pleural effusion. Stable associated lung base atelectasis. 2. No convincing pneumonia or pulmonary edema. 3. No change in the enlargement of the cardiopericardial silhouette. Electronically Signed   By: Lajean Manes M.D.   On: 09/15/2018 13:19   US Abdomen Limited Ruq  Result Date: 09/17/2018 CLINICAL DATA:  Infrahilar right lung mass. Hypermetabolic lateral left hepatic lesions suspected on PET-CT. Preop planning for possible biopsy EXAM: ULTRASOUND ABDOMEN LIMITED RIGHT UPPER QUADRANT COMPARISON:  PET-CT 09/15/2018 and previous FINDINGS: Gallbladder: Layering sludge. No wall thickening or pericholecystic fluid. Sonographer describes no sonographic Murphy's sign. Common bile duct: Diameter: 4.4 mm, unremarkable Liver: 2.3 x 2.2 x  2 cm lesion in the lateral left hepatic segment 1.4 x 1.3 cm slightly hyperechoic nodule in hepatic segment 5 2.3 x 2.1 by 2 cm slightly echogenic lesion in the anterior right hepatic lobe. These correspond to hypermetabolic foci on recent PET-CT.  Background liver parenchyma is markedly heterogenous. Nodular liver contour. Portal vein is patent on color Doppler imaging with normal direction of blood flow towards the liver. Other: Trace perihepatic ascites IMPRESSION: 1. Liver lesions corresponding to hypermetabolic foci on PET-CT. These should be approachable for ultrasound-guided core liver lesion biopsy. 2. Nodular hepatic contour with heterogenous echotexture consistent with cirrhosis. Electronically Signed   By: Lucrezia Europe M.D.   On: 09/17/2018 08:20    Mass of lower lobe of right lung 85 year old male patient non-smoker/mild CKD is currently admitted to hospital for worsening insufficiency/right lower lobe lung mass/bulky mediastinal adenopathy  #Right lower lobe lung mass/bulky mediastinal adenopathy/pericardial effusion-highly suspicious for malignant process.   PET scan results with the patient-right infrahilar mediastinal adenopathy/bulky soft tissue masses adjacent to distal esophagus/along the diaphragmatic crus; right femoral lesion; possible liver lesion.  Discussed with Dr. Pascal Lux from radiology-who recommends further delineation of the liver lesion with a CT scan with contrast; however after discussion with nephrology-it was felt that patient would be high risk for complications from renal failure from the contrast.  Plan to proceed with ultrasound-guided liver biopsy today.  #Chronic kidney disease/acute renal failure-Baseline creatinine 1.5.  Improving at 1.6.  Discussed with Dr.Kolluru.   #Spoke to patient's son yesterday over the plan with biopsy.  Informed patient's nurse of the plan for biopsy today.   Cammie Sickle, MD 09/17/2018  9:04 AM

## 2018-09-18 ENCOUNTER — Telehealth: Payer: Self-pay | Admitting: Internal Medicine

## 2018-09-18 ENCOUNTER — Inpatient Hospital Stay: Payer: Medicare Other

## 2018-09-18 DIAGNOSIS — K769 Liver disease, unspecified: Secondary | ICD-10-CM

## 2018-09-18 LAB — GLUCOSE, CAPILLARY
Glucose-Capillary: 123 mg/dL — ABNORMAL HIGH (ref 70–99)
Glucose-Capillary: 134 mg/dL — ABNORMAL HIGH (ref 70–99)
Glucose-Capillary: 159 mg/dL — ABNORMAL HIGH (ref 70–99)
Glucose-Capillary: 189 mg/dL — ABNORMAL HIGH (ref 70–99)
Glucose-Capillary: 152 mg/dL — ABNORMAL HIGH (ref 70–99)

## 2018-09-18 MED ORDER — FENTANYL CITRATE (PF) 100 MCG/2ML IJ SOLN
INTRAMUSCULAR | Status: AC
  Filled 2018-09-18: qty 4

## 2018-09-18 MED ORDER — MIDAZOLAM HCL 5 MG/5ML IJ SOLN
INTRAMUSCULAR | Status: AC
  Filled 2018-09-18: qty 5

## 2018-09-18 MED ORDER — FENTANYL CITRATE (PF) 100 MCG/2ML IJ SOLN
INTRAMUSCULAR | Status: AC | PRN
  Administered 2018-09-18: 50 ug via INTRAVENOUS

## 2018-09-18 MED ORDER — MIDAZOLAM HCL 5 MG/5ML IJ SOLN
INTRAMUSCULAR | Status: AC | PRN
  Administered 2018-09-18 (×2): 1 mg via INTRAVENOUS

## 2018-09-18 MED ORDER — ENOXAPARIN SODIUM 40 MG/0.4ML ~~LOC~~ SOLN
40.0000 mg | SUBCUTANEOUS | Status: DC
  Administered 2018-09-19: 08:00:00 40 mg via SUBCUTANEOUS
  Filled 2018-09-18: qty 0.4

## 2018-09-18 NOTE — Consult Note (Signed)
Post Procedure Management Guideline For Patients Having ELECTIVE Invasive Radiology Procedures: Standard.   Starting enoxaparin 40 mg q24H tomorrow AM per protocol.    Eleonore Chiquito, PharmD, BCPS

## 2018-09-18 NOTE — Addendum Note (Signed)
Addended by: Sabino Gasser on: 09/18/2018 11:56 AM   Modules accepted: Orders

## 2018-09-18 NOTE — Telephone Encounter (Signed)
The patient is in the hospital currently for liver biopsy/likely be discharged today all over the weekend  Please have the patient follow-up with me/Josh in the clinic Thursday/Friday of next week to discuss pathology.  Labs CBC CMP.  Thank you GB

## 2018-09-18 NOTE — TOC Progression Note (Addendum)
Transition of Care [facility]) - Progression Note    Patient Details  Name: [name] MRN: [id number] Date of Birth: [birth year]  [facility] CM/SW Contact  Ross Ludwig, [place] Phone Number: 09/18/2018, 5:35 PM  Clinical Narrative:     CSW followed up with patient, he still does not want to go to SNF for short term rehab.  Patient states with so many Covid outbreaks he does not want take the chance.  CSW again reminded him of the benefits of going to SNF verse going home with home health.  Patient states he would like home health to see him.  CSW contacted [facility] and they can accept patient for home health PT, OT, Nursing, Aide, and social worker.  CSW spoke with Malachy Mood at [facility] she said they will have to get auth, so it may take 48 hours post discharge before he is able to be seen.  CSW updated patient and he stated his son will be staying with him for several days once he discharges from the hospital.  Patient also informed CSW that he does not want his step daughter to be notified of his progress only his son.  CSW to continue to follow patient's progress throughout discharge planning.    Expected Discharge Plan: [place] Barriers to Discharge: Continued Medical Work up  Expected Discharge Plan and Services Expected Discharge Plan: [place] In-house Referral: Clinical Social Work     Living arrangements for the past 2 months: Single Family Home                                       Social Determinants of Health (SDOH) Interventions    Readmission Risk Interventions Readmission Risk Prevention Plan 09/14/2018  Medication Screening Complete  Transportation Screening Complete  Some recent data might be hidden

## 2018-09-18 NOTE — Progress Notes (Signed)
Talked to Sanjuana Mae, patient's neighbor and updated her about the plan of care for this patient. She knows the patient's password and gave me her number if we need somebody to pick patient up when patient get discharge she can be that person to transport patient. Karen's number is [phone] or [phone]

## 2018-09-18 NOTE — Progress Notes (Signed)
[facility]  ROUNDING NOTE   Subjective:   Underwent ultrasound guided liver mass biopsy.   No new renal function today  Objective:  Vital signs in last 24 hours:  Temp:  [97.5 F (36.4 C)-98.5 F (36.9 C)] 98.5 F (36.9 C) (07/31 0741) Pulse Rate:  [54-97] 54 (07/31 1200) Resp:  [16-29] 16 (07/31 1200) BP: (111-154)/(53-97) 112/55 (07/31 1200) SpO2:  [84 %-99 %] 97 % (07/31 1200) Weight:  [111.2 kg] 111.2 kg (07/31 0324)  Weight change: -0.635 kg Filed Weights   09/16/18 0328 09/17/18 0311 09/18/18 0324  Weight: 112.8 kg 111.9 kg 111.2 kg    Intake/Output: I/O last 3 completed shifts: In: -  Out: 1825 [Urine:1825]   Intake/Output this shift:  Total I/O In: -  Out: 150 [Urine:150]  Physical Exam: General: NAD, laying in bed  Head: Normocephalic, atraumatic. Moist oral mucosal membranes  Eyes: Anicteric, PERRL  Neck: Supple, trachea midline  Lungs:  clear  Heart: Regular rate and rhythm  Abdomen:  Soft, nontender,   Extremities:  trace peripheral edema.  Neurologic: Nonfocal, moving all four extremities  Skin: No lesions        Basic Metabolic Panel: Recent Labs  Lab 09/12/18 0727 09/13/18 1202 09/14/18 0743 09/15/18 0309 09/17/18 0639  NA 131* 134* 136 138 139  K 4.7 3.9 3.7 3.7 3.9  CL 95* 97* 98 97* 99  CO2 23 25 26 29 27   GLUCOSE 123* 166* 150* 160* 159*  BUN 72* 61* 43* 38* 39*  CREATININE 2.51* 1.99* 1.83* 1.64* 1.70*  CALCIUM 8.2* 8.4* 8.7* 8.9 8.9  PHOS  --   --   --   --  2.8    Liver Function Tests: Recent Labs  Lab 09/12/18 0727 09/17/18 0639  AST 18  --   ALT 15  --   ALKPHOS 64  --   BILITOT 0.8  --   PROT 6.2*  --   ALBUMIN 3.0* 3.4*   No results for input(s): LIPASE, AMYLASE in the last 168 hours. No results for input(s): AMMONIA in the last 168 hours.  CBC: Recent Labs  Lab 09/14/18 0743 09/17/18 0639  WBC 10.5 19.1*  HGB 13.0 14.0  HCT 39.7 44.4  MCV 92.1 93.9  PLT 284 261    Cardiac  Enzymes: No results for input(s): CKTOTAL, CKMB, CKMBINDEX, TROPONINI in the last 168 hours.  BNP: Invalid input(s): POCBNP  CBG: Recent Labs  Lab 09/17/18 0758 09/17/18 1151 09/17/18 1657 09/17/18 2127 09/18/18 0742  GLUCAP 154* 183* 228* 141* 123*    Microbiology: Results for orders placed or performed during the hospital encounter of 09/10/18  SARS Coronavirus 2 (CEPHEID - Performed in [place] hospital lab), Hosp Order     Status: None   Collection Time: 09/10/18  2:03 PM   Specimen: Nasopharyngeal Swab  Result Value Ref Range Status   SARS Coronavirus 2 NEGATIVE NEGATIVE Final    Comment: (NOTE) If result is NEGATIVE SARS-CoV-2 target nucleic acids are NOT DETECTED. The SARS-CoV-2 RNA is generally detectable in upper and lower  respiratory specimens during the acute phase of infection. The lowest  concentration of SARS-CoV-2 viral copies this assay can detect is 250  copies / mL. A negative result does not preclude SARS-CoV-2 infection  and should not be used as the sole basis for treatment or other  patient management decisions.  A negative result may occur with  improper specimen collection / handling, submission of specimen other  than nasopharyngeal swab,  presence of viral mutation(s) within the  areas targeted by this assay, and inadequate number of viral copies  (<250 copies / mL). A negative result must be combined with clinical  observations, patient history, and epidemiological information. If result is POSITIVE SARS-CoV-2 target nucleic acids are DETECTED. The SARS-CoV-2 RNA is generally detectable in upper and lower  respiratory specimens dur ing the acute phase of infection.  Positive  results are indicative of active infection with SARS-CoV-2.  Clinical  correlation with patient history and other diagnostic information is  necessary to determine patient infection status.  Positive results do  not rule out bacterial infection or co-infection with  other viruses. If result is PRESUMPTIVE POSTIVE SARS-CoV-2 nucleic acids MAY BE PRESENT.   A presumptive positive result was obtained on the submitted specimen  and confirmed on repeat testing.  While 2019 novel coronavirus  (SARS-CoV-2) nucleic acids may be present in the submitted sample  additional confirmatory testing may be necessary for epidemiological  and / or clinical management purposes  to differentiate between  SARS-CoV-2 and other Sarbecovirus currently known to infect humans.  If clinically indicated additional testing with an alternate test  methodology [phone]) is advised. The SARS-CoV-2 RNA is generally  detectable in upper and lower respiratory sp ecimens during the acute  phase of infection. The expected result is Negative. Fact Sheet for Patients:  StrictlyIdeas.no Fact Sheet for Healthcare Providers: BankingDealers.co.za This test is not yet approved or cleared by the [place] FDA and has been authorized for detection and/or diagnosis of SARS-CoV-2 by FDA under an Emergency Use Authorization (EUA).  This EUA will remain in effect (meaning this test can be used) for the duration of the COVID-19 declaration under Section 564(b)(1) of the Act, 21 U.S.C. section 360bbb-3(b)(1), unless the authorization is terminated or revoked sooner. Performed at [facility], [address] [place], [place] [address]     Coagulation Studies: Recent Labs    09/17/18 0731  LABPROT 14.9  INR 1.2    Urinalysis: No results for input(s): COLORURINE, LABSPEC, PHURINE, GLUCOSEU, HGBUR, BILIRUBINUR, KETONESUR, PROTEINUR, UROBILINOGEN, NITRITE, LEUKOCYTESUR in the last 72 hours.  Invalid input(s): APPERANCEUR    Imaging: US Biopsy (liver)  Result Date: 09/18/2018 INDICATION: 85 year old male with a history of liver mass EXAM: ULTRASOUND-GUIDED RIGHT LIVER MASS BIOPSY MEDICATIONS: None. ANESTHESIA/SEDATION: Moderate  (conscious) sedation was employed during this procedure. A total of Versed 2.0 mg and Fentanyl 50 mcg was administered intravenously. Moderate Sedation Time: 11 minutes. The patient's level of consciousness and vital signs were monitored continuously by radiology nursing throughout the procedure under my direct supervision. FLUOROSCOPY TIME:  None COMPLICATIONS: None PROCEDURE: Informed written consent was obtained from the patient after a thorough discussion of the procedural risks, benefits and alternatives. All questions were addressed. Maximal Sterile Barrier Technique was utilized including caps, mask, sterile gowns, sterile gloves, sterile drape, hand hygiene and skin antiseptic. A timeout was performed prior to the initiation of the procedure. Patient positioned supine position on the ultrasound stretcher. Images were stored sent to PACs. The patient is prepped and draped in the usual sterile fashion. 1% lidocaine was used for local anesthesia. Using ultrasound guidance, guide needle was advanced into the heterogeneously echoic lesion at the tip of segment 6. Once we confirmed needle position multiple 18 gauge core biopsy were acquired. Patient tolerated the procedure well and remained hemodynamically stable throughout. No complications were encountered and no significant blood loss. IMPRESSION: Status post ultrasound-guided biopsy of right liver lesion, segment 6.  Signed, Dulcy Fanny. Dellia Nims, RPVI [facility] Radiology Electronically Signed   By: Corrie Mckusick D.O.   On: 09/18/2018 12:03   US Abdomen Limited Ruq  Result Date: 09/17/2018 CLINICAL DATA:  Infrahilar right lung mass. Hypermetabolic lateral left hepatic lesions suspected on PET-CT. Preop planning for possible biopsy EXAM: ULTRASOUND ABDOMEN LIMITED RIGHT UPPER QUADRANT COMPARISON:  PET-CT 09/15/2018 and previous FINDINGS: Gallbladder: Layering sludge. No wall thickening or pericholecystic  fluid. Sonographer describes no sonographic Murphy's sign. Common bile duct: Diameter: 4.4 mm, unremarkable Liver: 2.3 x 2.2 x 2 cm lesion in the lateral left hepatic segment 1.4 x 1.3 cm slightly hyperechoic nodule in hepatic segment 5 2.3 x 2.1 by 2 cm slightly echogenic lesion in the anterior right hepatic lobe. These correspond to hypermetabolic foci on recent PET-CT. Background liver parenchyma is markedly heterogenous. Nodular liver contour. Portal vein is patent on color Doppler imaging with normal direction of blood flow towards the liver. Other: Trace perihepatic ascites IMPRESSION: 1. Liver lesions corresponding to hypermetabolic foci on PET-CT. These should be approachable for ultrasound-guided core liver lesion biopsy. 2. Nodular hepatic contour with heterogenous echotexture consistent with cirrhosis. Electronically Signed   By: Lucrezia Europe M.D.   On: 09/17/2018 08:20     Medications:   . sodium chloride Stopped (09/17/18 1003)  . sodium chloride 10 mL/hr at 09/18/18 0909   . docusate sodium  100 mg Oral BID  . [START ON 09/19/2018] enoxaparin (LOVENOX) injection  40 mg Subcutaneous Q24H  . feeding supplement (ENSURE ENLIVE)  237 mL Oral BID BM  . fentaNYL      . insulin aspart  0-9 Units Subcutaneous TID WC  . midazolam      . multivitamin with minerals  1 tablet Oral Daily  . pantoprazole  40 mg Oral Daily  . predniSONE  40 mg Oral Q breakfast  . sodium chloride flush  10 mL Intravenous Q12H   acetaminophen **OR** acetaminophen, bisacodyl, calcium carbonate, guaiFENesin-dextromethorphan, hydrALAZINE, HYDROcodone-acetaminophen, ipratropium-albuterol, LORazepam, ondansetron **OR** ondansetron (ZOFRAN) IV  Assessment/ Plan:  Mr. Javier Wong is a 85 y.o. white male withhypertension, hyperlipidemia, history of complete heart block status post dual chamber pacemaker placement, diabetes mellitus type II, gout, who was admitted to [facility] on7/23/2020. Found to have right lung mass.    1. Acute renal failure with hyperkalemia and metabolic acidosis: on chronic kidney disease stage III with proteinuria:  baseline creatinine 1.5, EGFR 45 on 05/20/2018. Chronic kidney disease secondary to NSAIDs and diabetes Acute renal failure secondary to acute cardiorenal syndrome. Peak creatinine of 2.84 GFR of 20 on 7/23 Required lokelma due to hyperkalemia.  - Discontinued naproxen on admission.  - holding ramipril, hydrochlorothiazide and furosemide.    2. Pericardial effusion: no acute tamponade physiology. No evidence of uremia  3. Hypertension: Home regimen of ramipril, hydrochlorothiazide and furosemide. Holding due to acute renal failure - PRN IV hydralazine.   4. Right lower lobe lung mass.  - work up as per pulmonology and heme/onc.   - Do not recommend IV contrast exposure at this time.    LOS: 8 Labrea Eccleston 7/31/202012:35 PM

## 2018-09-18 NOTE — Procedures (Signed)
Interventional Radiology Procedure Note  Procedure: US guided biopsy of right liver mass, at tip of segment 6. Mx 18g core bx.  Complications: None Recommendations:  - Ok to shower tomorrow - Do not submerge for 7 days - Routine care   Signed,  Dulcy Fanny. Earleen [place], DO

## 2018-09-18 NOTE — Progress Notes (Signed)
Javier Wong   DOB:05/01/1932   HW#:[id number]    Subjective: Patient states that he had a good night sleep.  He denies any chest pain.  Continues to complain of shortness of breath on exertion.  He is frustrated that he could not get his biopsy yesterday. Objective:  Vitals:   09/18/18 1032 09/18/18 1058  BP: (!) 142/65 120/64  Pulse: 93 (!) 56  Resp:  (!) 29  Temp:    SpO2: 92% 99%     Intake/Output Summary (Last 24 hours) at 09/18/2018 1105 Last data filed at 09/18/2018 0736 Gross per 24 hour  Intake -  Output 1075 ml  Net -1075 ml    Physical Exam  Constitutional: He is oriented to person, place, and time and well-developed, well-nourished, and in no distress.  Obese.  Resting in the bed comfortably.  He is alone.  Not needing oxygen.  HENT:  Head: Normocephalic and atraumatic.  Mouth/Throat: Oropharynx is clear and moist. No oropharyngeal exudate.  Eyes: Pupils are equal, round, and reactive to light.  Neck: Normal range of motion. Neck supple.  Cardiovascular: Normal rate and regular rhythm.  Pulmonary/Chest: No respiratory distress. He has no wheezes.  Decreased air entry bilaterally.  Abdominal: Soft. Bowel sounds are normal. He exhibits no distension and no mass. There is no abdominal tenderness. There is no rebound and no guarding.  Musculoskeletal: Normal range of motion.        General: Edema present. No tenderness.  Neurological: He is alert and oriented to person, place, and time.  Skin: Skin is warm.  Psychiatric: Affect normal.      Labs:  Lab Results  Component Value Date   WBC 19.1 (H) 09/17/2018   HGB 14.0 09/17/2018   HCT 44.4 09/17/2018   MCV 93.9 09/17/2018   PLT 261 09/17/2018   NEUTROABS 12.9 (H) 09/10/2018    Lab Results  Component Value Date   NA 139 09/17/2018   K 3.9 09/17/2018   CL 99 09/17/2018   CO2 27 09/17/2018    Studies:  US Abdomen Limited Ruq  Result Date: 09/17/2018 CLINICAL DATA:  Infrahilar right lung mass.  Hypermetabolic lateral left hepatic lesions suspected on PET-CT. Preop planning for possible biopsy EXAM: ULTRASOUND ABDOMEN LIMITED RIGHT UPPER QUADRANT COMPARISON:  PET-CT 09/15/2018 and previous FINDINGS: Gallbladder: Layering sludge. No wall thickening or pericholecystic fluid. Sonographer describes no sonographic Murphy's sign. Common bile duct: Diameter: 4.4 mm, unremarkable Liver: 2.3 x 2.2 x 2 cm lesion in the lateral left hepatic segment 1.4 x 1.3 cm slightly hyperechoic nodule in hepatic segment 5 2.3 x 2.1 by 2 cm slightly echogenic lesion in the anterior right hepatic lobe. These correspond to hypermetabolic foci on recent PET-CT. Background liver parenchyma is markedly heterogenous. Nodular liver contour. Portal vein is patent on color Doppler imaging with normal direction of blood flow towards the liver. Other: Trace perihepatic ascites IMPRESSION: 1. Liver lesions corresponding to hypermetabolic foci on PET-CT. These should be approachable for ultrasound-guided core liver lesion biopsy. 2. Nodular hepatic contour with heterogenous echotexture consistent with cirrhosis. Electronically Signed   By: Lucrezia Europe M.D.   On: 09/17/2018 08:20    Mass of lower lobe of right lung 85 year old male patient non-smoker/mild CKD is currently admitted to hospital for worsening insufficiency/right lower lobe lung mass/bulky mediastinal adenopathy  #Right lower lobe lung mass/bulky mediastinal adenopathy/pericardial effusion-highly suspicious for malignant process.   PET scan results with the patient-right infrahilar mediastinal adenopathy/bulky soft tissue masses adjacent to distal esophagus/along  the diaphragmatic crus; right femoral lesion; possible liver lesion.  Unfortunately biopsy could not be done yesterday because of scheduling reasons.  I discussed with IR-awaiting to have biopsy today.  #Chronic kidney disease/acute renal failure-Baseline creatinine 1.5.  Improving at 1.6.  Discussed with Dr.Kolluru.    #Disposition: Patient is recommended SNF placement; however he wants to go home/concerns for coronavirus.  He states his son should be in town visiting from [place].  I will reach out to patient's son.  If patient is discharged over the weekend-I have the patient follow-up with me in the clinic next week to discuss the pathology.   Cammie Sickle, MD 09/18/2018  11:05 AM

## 2018-09-18 NOTE — Progress Notes (Signed)
[place] [facility] [place] NAME: Javier Wong    MR#:  [id number]  DATE OF BIRTH:  1932-03-08  SUBJECTIVE:   N.p.o.  Scheduled for liver biopsy today.  REVIEW OF SYSTEMS:  Review of Systems  Constitutional: Negative for chills, fever and malaise/fatigue.  HENT: Negative for congestion and sore throat.   Eyes: Negative for blurred vision and double vision.  Respiratory: Positive for shortness of breath. Negative for cough.   Cardiovascular: Negative for chest pain and palpitations.  Gastrointestinal: Negative for abdominal pain, nausea and vomiting.  Genitourinary: Negative for dysuria and urgency.  Musculoskeletal: Negative for back pain and neck pain.  Neurological: Positive for weakness. Negative for dizziness, focal weakness and headaches.  Psychiatric/Behavioral: Negative for depression. The patient is not nervous/anxious.    DRUG ALLERGIES:  No Known Allergies VITALS:  Blood pressure (!) 112/55, pulse (!) 54, temperature 98.5 F (36.9 C), temperature source Oral, resp. rate 16, height 5\' 11"  (1.803 m), weight 111.2 kg, SpO2 97 %. PHYSICAL EXAMINATION:  Physical Exam  GENERAL:   Denies any complaints. HEENT: Head atraumatic, normoceph and accommodation. No scleral icterus. Extraocular muscles intact. Oropharynx and nasopharynx clear.  NECK:  Supple, no jugular venous distention. No thyroid enlargement. LUNGS -clear to auscultation bilaterally CARDIOVASCULAR: Bradycardia resolved, heart rate 62 bpm. ABDOMEN: Soft, nontender, nondistended. Bowel sounds present.  EXTREMITIES: No pedal edema, cyanosis, or clubbing.  NEUROLOGIC: CN 2-12 intact, no focal deficits.  Sensation intact throughout. Gait not checked.  PSYCHIATRIC: The patient is alert and oriented x 3.   SKIN: No obvious rash, lesion, or ulcer.  LABORATORY PANEL:  Male CBC Recent Labs  Lab 09/17/18 0639  WBC 19.1*  HGB 14.0  HCT 44.4  PLT 261    ------------------------------------------------------------------------------------------------------------------ Chemistries  Recent Labs  Lab 09/12/18 0727  09/17/18 0639  NA 131*   < > 139  K 4.7   < > 3.9  CL 95*   < > 99  CO2 23   < > 27  GLUCOSE 123*   < > 159*  BUN 72*   < > 39*  CREATININE 2.51*   < > 1.70*  CALCIUM 8.2*   < > 8.9  AST 18  --   --   ALT 15  --   --   ALKPHOS 64  --   --   BILITOT 0.8  --   --    < > = values in this interval not displayed.   RADIOLOGY:  US Biopsy (liver)  Result Date: 09/18/2018 INDICATION: 85 year old male with a history of liver mass EXAM: ULTRASOUND-GUIDED RIGHT LIVER MASS BIOPSY MEDICATIONS: None. ANESTHESIA/SEDATION: Moderate (conscious) sedation was employed during this procedure. A total of Versed 2.0 mg and Fentanyl 50 mcg was administered intravenously. Moderate Sedation Time: 11 minutes. The patient's level of consciousness and vital signs were monitored continuously by radiology nursing throughout the procedure under my direct supervision. FLUOROSCOPY TIME:  None COMPLICATIONS: None PROCEDURE: Informed written consent was obtained from the patient after a thorough discussion of the procedural risks, benefits and alternatives. All questions were addressed. Maximal Sterile Barrier Technique was utilized including caps, mask, sterile gowns, sterile gloves, sterile drape, hand hygiene and skin antiseptic. A timeout was performed prior to the initiation of the procedure. Patient positioned supine position on the ultrasound stretcher. Images were stored sent to PACs. The patient is prepped and draped in the usual sterile fashion. 1% lidocaine was used for local anesthesia. Using ultrasound guidance,  guide needle was advanced into the heterogeneously echoic lesion at the tip of segment 6. Once we confirmed needle position multiple 18 gauge core biopsy were acquired. Patient tolerated the procedure well and remained hemodynamically stable  throughout. No complications were encountered and no significant blood loss. IMPRESSION: Status post ultrasound-guided biopsy of right liver lesion, segment 6. Signed, Dulcy Fanny. Dellia Nims, RPVI [facility] Radiology Electronically Signed   By: Corrie Mckusick D.O.   On: 09/18/2018 12:03   ASSESSMENT AND PLAN:   * Right lower lobe lung mass with subcarinal extension Seen by pulmonary and oncology.   PET with Mets for liver and femur Liver biopsy later today.  Patient is n.p.o.  *Acute on chronic diastolic congestive heart failure Improved with IV lasix. Resolved  * Moderate pericardial effusion- seen on echo.  No evidence of tamponade physiology.  Repeat echocardiogram with similar pericardial effusion.  Appreciate cardiology input.  * Acute kidney injury- likely secondary to NSAID use and poor cardiac output.  Renal function is slowly improving, creatinine down from 2.84-->1.83 -->1.6 - Renal ultrasound showed medical renal disease, but no acute abnormalities. - Nephrology consulted and appreciate input.  Discussed with Dr. Juleen China. - Avoid nephrotoxic agents.  *Hyperkalemia secondary to acute kidney injury.  Resolved.  *Atrial flutter found on EKG.  Cardiology following.  Has pacemaker in place.  Not started on anticoagulation due to patient awaiting further treatment plan with biopsy.  * Deconditioning- due to above -PT consulted.  * Type 2 diabetes -Continue SSI  All the records are reviewed and case discussed with Care Management/Social Worker. Management plans discussed with the patient, family and they are in agreement.  CODE STATUS: DNR  TOTAL TIME TAKING CARE OF THIS PATIENT: 35 minutes.   We will monitor overnight after liver biopsy.  Patient lives alone.  Likely discharge tomorrow.  Javier Wong M.D on 09/18/2018 at 1:44 PM  Between 7am to 6pm - Pager - [phone]  After 6pm go to [email or web address] - Solicitor  [facility]  Office  [phone]  CC: Primary care physician; Glendon Axe, MD  Note: This dictation was prepared with Dragon dictation along with smaller phrase technology. Any transcriptional errors that result from this process are unintentional.

## 2018-09-18 NOTE — Progress Notes (Signed)
Pulmonary Medicine          Date: 09/18/2018,   MRN# [id number] Javier Wong Mar 30, 1932     AdmissionWeight: 108.9 kg                 CurrentWeight: 111.2 kg        SUBJECTIVE   Patient evaluated at bedside reports "scared could not sleep", however less SOB due to diuresis "urinating every 5 mintues" .  He does have atelectasis at right base and I will add MetaNEB therapy today.   There is plan for US guided liver biopsy - hopefully will be enough to get answer.     I will be available for additional evaluation and EBUS if needed but at this point will sign off.  I can see patient in pulmonary clinic as well.    Thank you everyone for collaboration on this gentleman. Please call if needed.    PAST MEDICAL HISTORY   Past Medical History:  Diagnosis Date   Chronic kidney disease    Diabetes type 2, controlled ([place])    Hypertension      SURGICAL HISTORY   Patient denies previous surgery   FAMILY HISTORY   Family History  Problem Relation Age of Onset   Mesothelioma Father      SOCIAL HISTORY   Social History   Tobacco Use   Smoking status: Never Smoker   Smokeless tobacco: Never Used  Substance Use Topics   Alcohol use: Never    Frequency: Never   Drug use: Never     MEDICATIONS    Home Medication:    Current Medication:  Current Facility-Administered Medications:    0.9 %  sodium chloride infusion, , Intravenous, Continuous, Sandi Mariscal, MD, Stopped at 09/17/18 1003   0.9 %  sodium chloride infusion, , Intravenous, Continuous, Sandi Mariscal, MD, Last Rate: 10 mL/hr at 09/18/18 4097   acetaminophen (TYLENOL) tablet 650 mg, 650 mg, Oral, Q6H PRN **OR** acetaminophen (TYLENOL) suppository 650 mg, 650 mg, Rectal, Q6H PRN, Epifanio Lesches, MD   bisacodyl (DULCOLAX) EC tablet 5 mg, 5 mg, Oral, Daily PRN, Epifanio Lesches, MD   calcium carbonate (TUMS - dosed in mg elemental calcium) chewable tablet 200 mg of  elemental calcium, 1 tablet, Oral, TID PRN, Hillary Bow, MD, 200 mg of elemental calcium at 09/14/18 1309   docusate sodium (COLACE) capsule 100 mg, 100 mg, Oral, BID, Epifanio Lesches, MD, 100 mg at 09/16/18 2148   feeding supplement (ENSURE ENLIVE) (ENSURE ENLIVE) liquid 237 mL, 237 mL, Oral, BID BM, Sudini, Srikar, MD, 237 mL at 09/17/18 1237   fentaNYL (SUBLIMAZE) 100 MCG/2ML injection, , , ,    guaiFENesin-dextromethorphan (ROBITUSSIN DM) 100-10 MG/5ML syrup 5 mL, 5 mL, Oral, Q4H PRN, Epifanio Lesches, MD, 5 mL at 09/13/18 1802   hydrALAZINE (APRESOLINE) injection 10 mg, 10 mg, Intravenous, Q4H PRN, Kolluru, Sarath, MD   HYDROcodone-acetaminophen (NORCO/VICODIN) 5-325 MG per tablet 1 tablet, 1 tablet, Oral, Q4H PRN, Epifanio Lesches, MD   insulin aspart (novoLOG) injection 0-9 Units, 0-9 Units, Subcutaneous, TID WC, Epifanio Lesches, MD, 3 Units at 09/17/18 1807   ipratropium-albuterol (DUONEB) 0.5-2.5 (3) MG/3ML nebulizer solution 3 mL, 3 mL, Nebulization, Q4H PRN, Lance Coon, MD, 3 mL at 09/14/18 0750   LORazepam (ATIVAN) tablet 0.5 mg, 0.5 mg, Oral, TID PRN, Hillary Bow, MD, 0.5 mg at 09/17/18 2323   midazolam (VERSED) 5 MG/5ML injection, , , ,    multivitamin with minerals tablet 1 tablet, 1  tablet, Oral, Daily, Mayo, Pete Pelt, MD, 1 tablet at 09/17/18 1005   ondansetron (ZOFRAN) tablet 4 mg, 4 mg, Oral, Q6H PRN **OR** ondansetron (ZOFRAN) injection 4 mg, 4 mg, Intravenous, Q6H PRN, Epifanio Lesches, MD   pantoprazole (PROTONIX) EC tablet 40 mg, 40 mg, Oral, Daily, Sudini, Srikar, MD, 40 mg at 09/17/18 1005   predniSONE (DELTASONE) tablet 40 mg, 40 mg, Oral, Q breakfast, Peretz Thieme, MD, 40 mg at 09/16/18 0917   sodium chloride flush (NS) 0.9 % injection 10 mL, 10 mL, Intravenous, Q12H, Epifanio Lesches, MD, 10 mL at 09/17/18 2057    ALLERGIES   Patient has no known allergies.     REVIEW OF SYSTEMS    Review of  Systems:  Gen:  Denies  fever, sweats, chills weigh loss  HEENT: Denies blurred vision, double vision, ear pain, eye pain, hearing loss, nose bleeds, sore throat Cardiac:  No dizziness, chest pain or heaviness, chest tightness,edema Resp:   Denies cough or sputum porduction, shortness of breath,wheezing, hemoptysis,  Gi: Denies swallowing difficulty, stomach pain, nausea or vomiting, diarrhea, constipation, bowel incontinence Gu:  Denies bladder incontinence, burning urine Ext:   Denies Joint pain, stiffness or swelling Skin: Denies  skin rash, easy bruising or bleeding or hives Endoc:  Denies polyuria, polydipsia , polyphagia or weight change Psych:   Denies depression, insomnia or hallucinations   Other:  All other systems negative   VS: BP (!) 154/88 (BP Location: Left Arm)    Pulse 85    Temp 98.5 F (36.9 C) (Oral)    Resp 19    Ht 5\' 11"  (1.803 m)    Wt 111.2 kg    SpO2 90%    BMI 34.20 kg/m      PHYSICAL EXAM    GENERAL:NAD, no fevers, chills, no weakness no fatigue HEAD: Normocephalic, atraumatic.  EYES: Pupils equal, round, reactive to light. Extraocular muscles intact. No scleral icterus.  MOUTH: Moist mucosal membrane. Dentition intact. No abscess noted.  EAR, NOSE, THROAT: Clear without exudates. No external lesions.  NECK: Supple. No thyromegaly. No nodules. No JVD.  PULMONARY: mild crackles worse at bases bilaterally.   CARDIOVASCULAR: S1 and S2. Regular rate and rhythm. No murmurs, rubs, or gallops. No edema. Pedal pulses 2+ bilaterally.  GASTROINTESTINAL: Soft, nontender, nondistended. No masses. Positive bowel sounds. No hepatosplenomegaly.  MUSCULOSKELETAL: No swelling, clubbing, or edema. Range of motion full in all extremities.  NEUROLOGIC: Cranial nerves II through XII are intact. No gross focal neurological deficits. Sensation intact. Reflexes intact.  SKIN: No ulceration, lesions, rashes, or cyanosis. Skin warm and dry. Turgor intact.  PSYCHIATRIC: Mood,  affect within normal limits. The patient is awake, alert and oriented x 3. Insight, judgment intact.       IMAGING    Ct Chest Wo Contrast  Result Date: 09/11/2018 CLINICAL DATA:  Increasing weakness, fatigue and nausea. EXAM: CT CHEST WITHOUT CONTRAST TECHNIQUE: Multidetector CT imaging of the chest was performed following the standard protocol without IV contrast. COMPARISON:  Chest x-ray 09/10/2018 FINDINGS: Cardiovascular: The heart is normal in size for the patient's age. There is a moderate to large pericardial effusion. This appears to be simple fluid. The pacer wires are in good position without complicating features. There is mild tortuosity, ectasia and moderate atherosclerotic calcifications involving the thoracic aorta. Three-vessel coronary artery calcifications are noted. Mediastinum/Nodes: There is a very large subcarinal mass measuring at least 7.7 x 4.9 x 5.1 cm. It appears to be contiguous with a right lower  lobe soft tissue mass in the as ago esophageal recess. This also continues down to the diaphragmatic crus and there is adjacent probable nodal disease just above the celiac axis. A few other small scattered upper mediastinal lymph nodes. I do not see any definite direct involvement of the esophagus. There is some high attenuation material the esophagus which could be debris or pills. Lungs/Pleura: Suspect right lower lobe mass in the as ago esophageal recess contiguous with the large mediastinal mass or adenopathy. Moderate bilateral pleural effusions along with fluid in both major fissures. There is an ill-defined 16 mm lesion in the right middle lobe which is indeterminate. Moderate vascular congestion and probable mild perihilar pulmonary edema. Upper Abdomen: Advanced cirrhotic changes involving the liver with a very irregular liver contour, dilated hepatic fissures and increased caudate to right lobe ratio. No obvious hepatic lesions without contrast. No splenomegaly. 2 cm nodal  lesion noted just above the celiac axis and adjacent to the caudate lobe of the liver. Musculoskeletal: Indeterminate subcutaneous lesion involving the posterior lower thorax. Smaller adjacent lesion is also indeterminate. These could be benign complex sebaceous cysts. Mild symmetric bilateral gynecomastia. Left-sided permanent pacemaker without complicating features. Advanced degenerative changes involving the spine but no worrisome bone lesions. IMPRESSION: 1. 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 PET-CT or biopsy may be helpful for further evaluation. 2. Moderate-sized bilateral pleural effusions. 3. Moderate to large pericardial effusion. 4. Indeterminate 16 mm nodule in the right middle lobe, possibly a metastatic focus. 5. Advanced cirrhotic changes involving the liver without definite hepatic lesions. Aortic Atherosclerosis (ICD10-I70.0). Electronically Signed   By: Marijo Sanes M.D.   On: 09/11/2018 13:51   US Renal  Result Date: 09/11/2018 CLINICAL DATA:  Acute kidney injury EXAM: RENAL / URINARY TRACT ULTRASOUND COMPLETE COMPARISON:  None. FINDINGS: Right Kidney: Renal measurements: 11.0 x 5.4 x 5.1 cm = volume: 159 mL. Slightly increased echotexture diffusely. Cortical thinning. No mass or hydronephrosis Left Kidney: Renal measurements: 11.2 x 5.9 x 5.7 cm = volume: 200 mL. Slightly increased echotexture diffusely with cortical thinning. No mass or hydronephrosis. Bladder: Appears normal for degree of bladder distention. Small amount of ascites seen within the abdomen. IMPRESSION: Increased echotexture and cortical thinning compatible with chronic medical renal disease. No acute findings. No hydronephrosis. Small amount of ascites incidentally noted. Electronically Signed   By: Rolm Baptise M.D.   On: 09/11/2018 10:52   Nm Pet Image Initial (pi) Skull Base To  Thigh  Result Date: 09/15/2018 CLINICAL DATA:  Initial treatment strategy for lung mass. EXAM: NUCLEAR MEDICINE PET SKULL BASE TO THIGH TECHNIQUE: 14.0 mCi F-18 FDG was injected intravenously. Full-ring PET imaging was performed from the skull base to thigh after the radiotracer. CT data was obtained and used for attenuation correction and anatomic localization. Fasting blood glucose: 124 mg/dl COMPARISON:  Chest CT 09/11/2018 FINDINGS: Mediastinal blood pool activity: SUV max 2.4 Liver activity: SUV max NA NECK: No hypermetabolic lymph nodes in the neck. Incidental CT findings: none CHEST: 6.8 x 5.1 cm soft tissue mass is identified in the infrahilar aspect of the medial right lower lobe with extension of soft tissue into the mediastinum/subcarinal station. This lesion is markedly hypermetabolic with SUV max = 09.7. No definite evidence for contralateral hypermetabolic hilar lymphadenopathy. No hypermetabolic lymphadenopathy in the axillary regions. Hypermetabolic soft tissue is identified in the  region of the right diaphragmatic crus/esophageal hiatus and extending posterolaterally on the right. This abnormal soft tissue is markedly hypermetabolic with SUV max = 03.4. The portion of this mass anterior to the descending aorta on 01/30 2/3 measures 4.1 x 6.2 cm. I think the esophagus is deflected to the left of this lesion, but anatomy in this region is poorly demonstrated on noncontrast imaging. I cannot exclude that this lesion arises from the esophagus. Incidental CT findings: Left-sided permanent pacemaker noted. The heart is enlarged. Small pericardial effusion is similar to prior. Coronary artery calcification is evident. Atherosclerotic calcification is noted in the wall of the thoracic aorta. Bilateral collapse/consolidation noted in the lower lungs with small to moderate bilateral pleural effusions. ABDOMEN/PELVIS: Uptake in the liver parenchyma is very mottled which could obscure hypermetabolic small  liver lesions. There is a subtle hypoattenuating focus in the lateral segment left liver (149/3) that shows a more prominent degree of FDG accumulation ( SUV max = 6.1.) than other areas of the liver, suggesting that this represents a real hypermetabolic liver lesion/metastasis. 2.4 cm lymph node just cranial to the celiac axis is hypermetabolic with SUV max = 74.2. 1.6 cm soft tissue nodule lateral to the lower pole left kidney shows no hypermetabolism. Incidental CT findings: Nodular liver contour compatible with cirrhosis. There is abdominal aortic atherosclerosis without aneurysm. SKELETON: Diffusely mottled uptake identified in the marrow space with focal hypermetabolism identified in the posterior cortex of the proximal right femur ( SUV max = 7.8). This corresponds to a subtle area of cortical lucency visible on 269/3. Incidental CT findings: Degenerative changes noted in the shoulders bilaterally. IMPRESSION: 1. The infrahilar right lung lesion, filling the as ago esophageal recess extending up into the subcarinal region is markedly hypermetabolic consistent with neoplasm. Bulky soft tissue lesions adjacent/involving the distal esophagus and extending posteriorly along the right diaphragmatic crus are not well demonstrated on this noncontrast exam, but are markedly hypermetabolic. This disease appears to deflect the esophagus to the left, but esophageal involvement/origin of the neoplasm not excluded. 2. Probable hypermetabolic metastasis in the lateral segment left liver, poorly demonstrated on noncontrast CT imaging. 3. 2.4 cm hypermetabolic lymph node just cranial to the celiac axis consistent with metastatic involvement. 4. Hypermetabolic lucent lesion in the posterior cortex of the proximal right femur, concerning for metastatic involvement. 5. Bibasilar collapse/consolidation in the lungs with small to moderate bilateral pleural effusions. 6. Nodular hepatic contour consistent with cirrhosis. 7. Small  pericardial effusion. 8.  Aortic Atherosclerois (ICD10-170.0) Electronically Signed   By: Misty Stanley M.D.   On: 09/15/2018 14:14   Dg Chest Port 1 View  Result Date: 09/15/2018 CLINICAL DATA:  85 year old male patient non-smoker/mild CKD is currently admitted to hospital for worsening insufficiency/right lower lobe lung mass/bulky mediastinal adenopathy. Hx of HTN and DM. Hx of pacemaker. EXAM: PORTABLE CHEST 1 VIEW COMPARISON:  09/13/2018 and earlier exams. FINDINGS: Bilateral pleural effusions obscure the hemidiaphragms, extending into the oblique fissure on the right, similar on the left and mildly increased on the right from the most recent prior exam. Associated basilar lung opacity is consistent with atelectasis, similar to the prior study. Upper lungs remain clear. There is no evidence of pulmonary edema. Cardiac silhouette is mildly enlarged, stable. Stable left anterior chest wall sequential pacemaker. No pneumothorax. IMPRESSION: 1. Mild interval increase in the size of the right pleural effusion with stable appearance of the left pleural effusion. Stable associated lung base atelectasis. 2. No convincing pneumonia or pulmonary edema. 3.  No change in the enlargement of the cardiopericardial silhouette. Electronically Signed   By: Lajean Manes M.D.   On: 09/15/2018 13:19   Dg Chest Port 1 View  Result Date: 09/13/2018 CLINICAL DATA:  85 year old male with history of shortness of breath today. EXAM: PORTABLE CHEST 1 VIEW COMPARISON:  Chest x-ray 09/10/2018. FINDINGS: Small bilateral pleural effusions. Bibasilar opacities which may reflect areas of atelectasis and/or consolidation. Cephalization of the pulmonary vasculature. Enlargement of the cardiopericardial silhouette. Upper mediastinal contours are within normal limits. Aortic atherosclerosis. Left-sided pacemaker with lead tips projecting over the expected location of the right atrium and right ventricle. IMPRESSION: 1. Enlargement of the  cardiopericardial silhouette related to known pericardial effusion (better demonstrated on recent chest CT). 2. Small bilateral pleural effusions with bibasilar opacities favored to reflect areas of atelectasis and/or consolidation. 3. Pulmonary venous congestion, without frank pulmonary edema. Electronically Signed   By: Vinnie Langton M.D.   On: 09/13/2018 09:25   Dg Chest Portable 1 View  Result Date: 09/10/2018 CLINICAL DATA:  Shortness of breath. EXAM: PORTABLE CHEST 1 VIEW COMPARISON:  Radiograph of April 06, 2010. FINDINGS: Stable cardiomegaly. Left-sided pacemaker is unchanged in position. No pneumothorax is noted. No significant pleural effusion is noted. Minimal bibasilar subsegmental atelectasis is noted. Degenerative changes are seen involving both glenohumeral joints. IMPRESSION: Minimal bibasilar subsegmental atelectasis. Electronically Signed   By: Marijo Conception M.D.   On: 09/10/2018 14:47   US Abdomen Limited Ruq  Result Date: 09/17/2018 CLINICAL DATA:  Infrahilar right lung mass. Hypermetabolic lateral left hepatic lesions suspected on PET-CT. Preop planning for possible biopsy EXAM: ULTRASOUND ABDOMEN LIMITED RIGHT UPPER QUADRANT COMPARISON:  PET-CT 09/15/2018 and previous FINDINGS: Gallbladder: Layering sludge. No wall thickening or pericholecystic fluid. Sonographer describes no sonographic Murphy's sign. Common bile duct: Diameter: 4.4 mm, unremarkable Liver: 2.3 x 2.2 x 2 cm lesion in the lateral left hepatic segment 1.4 x 1.3 cm slightly hyperechoic nodule in hepatic segment 5 2.3 x 2.1 by 2 cm slightly echogenic lesion in the anterior right hepatic lobe. These correspond to hypermetabolic foci on recent PET-CT. Background liver parenchyma is markedly heterogenous. Nodular liver contour. Portal vein is patent on color Doppler imaging with normal direction of blood flow towards the liver. Other: Trace perihepatic ascites IMPRESSION: 1. Liver lesions corresponding to  hypermetabolic foci on PET-CT. These should be approachable for ultrasound-guided core liver lesion biopsy. 2. Nodular hepatic contour with heterogenous echotexture consistent with cirrhosis. Electronically Signed   By: Lucrezia Europe M.D.   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     -appreciate everyone involved with this patient's care     Thank you for allowing me to participate in the care of this patient.   Patient/Family  are satisfied with care plan and all questions have been answered.  This document was prepared using Dragon voice recognition software and may include unintentional dictation errors.     Ottie Glazier, M.D.  Division of [place]

## 2018-09-19 LAB — GLUCOSE, CAPILLARY
Glucose-Capillary: 113 mg/dL — ABNORMAL HIGH (ref 70–99)
Glucose-Capillary: 221 mg/dL — ABNORMAL HIGH (ref 70–99)

## 2018-09-19 MED ORDER — PREDNISONE 10 MG PO TABS: 20.0000 mg | ORAL_TABLET | Freq: Every day | ORAL | 0 refills | Status: AC

## 2018-09-19 MED ORDER — COMBIVENT RESPIMAT 20-100 MCG/ACT IN AERS: 1.0000 | INHALATION_SPRAY | Freq: Three times a day (TID) | RESPIRATORY_TRACT | 1 refills | Status: AC

## 2018-09-19 NOTE — TOC Transition Note (Signed)
Transition of Care [facility]) - CM/SW Discharge Note   Patient Details  Name: Javier Wong MRN: [id number] Date of Birth: [birth year]  [facility] CM/SW Contact:  [name], LCSW Phone Number: 09/19/2018, 11:46 AM   Clinical Narrative:  Patient has discharge and home health orders. CSW notified [facility] representative. No DME needs. No further concerns. CSW signing off.   Final next level of care: [place] Barriers to Discharge: Barriers Resolved   Patient Goals and CMS Choice        Discharge Placement                    Patient and family notified of of transfer: 09/19/18  Discharge Plan and Services In-house Referral: Clinical Social Work                        HH Arranged: Therapist, sports, PT, OT, Nurse's Aide, Social Work [employer] Agency: [place] Date HH Agency Contacted: 09/19/18   Representative spoke with at [place]: [place] ([place]) Interventions     Readmission Risk Interventions Readmission Risk Prevention Plan 09/14/2018  Medication Screening Complete  Transportation Screening Complete  Some recent data might be hidden

## 2018-09-19 NOTE — Progress Notes (Signed)
Ch visited w/ pt as a f/u. Pt shared that he was being d/c today. Ch asked guided questions to determine if the pt was still under emotional distress. Pt shared that he was ready to go home even though it was suggested that he go to rehab. Pt shared that he did not want to take the risk of being exposed to COVID-19 in the facility and he wants to have home cooked meals. Ch understood the desire of the pt to be in his place of comfort. Pt shared that he was still waiting results from his lung biopsy. Ch help the pt to re-frame his outlook on his health considering he has lived most of his life without any major health complications. Pt stated that he was feeling fine overall and ch even notice that his swelling had reduced since his initial hospitalization. Ch was concerned about the pt's transition to home. Pt shared that his son would be coming in from [place] for a few days until he gets table. Pt asked for prayer. Ch prayed for the son's safe travels, the pt's health, and for him to maintain his quality of life.    09/19/18 1100  Clinical Encounter Type  Visited With Patient  Visit Type Follow-up;Spiritual support;Social support  Spiritual Encounters  Spiritual Needs Emotional;Grief support;Prayer  Stress Factors  Patient Stress Factors Health changes;Lack of caregivers;Loss of control;Major life changes  Family Stress Factors None identified

## 2018-09-21 ENCOUNTER — Inpatient Hospital Stay
Admission: EM | Admit: 2018-09-21 | Discharge: 2018-09-23 | DRG: 291 | Disposition: A | Discharge status: Home / Self Care | Payer: Medicare Other | Attending: Internal Medicine | Admitting: Internal Medicine

## 2018-09-21 ENCOUNTER — Emergency Department: Payer: Medicare Other

## 2018-09-21 ENCOUNTER — Encounter: Payer: Self-pay | Admitting: Emergency Medicine

## 2018-09-21 ENCOUNTER — Other Ambulatory Visit: Payer: Self-pay

## 2018-09-21 DIAGNOSIS — N179 Acute kidney failure, unspecified: Secondary | ICD-10-CM | POA: Diagnosis present

## 2018-09-21 DIAGNOSIS — S80821A Blister (nonthermal), right lower leg, initial encounter: Secondary | ICD-10-CM | POA: Diagnosis present

## 2018-09-21 DIAGNOSIS — N189 Chronic kidney disease, unspecified: Secondary | ICD-10-CM

## 2018-09-21 DIAGNOSIS — R590 Localized enlarged lymph nodes: Secondary | ICD-10-CM | POA: Diagnosis not present

## 2018-09-21 DIAGNOSIS — I5043 Acute on chronic combined systolic (congestive) and diastolic (congestive) heart failure: Secondary | ICD-10-CM | POA: Diagnosis present

## 2018-09-21 DIAGNOSIS — Z20828 Contact with and (suspected) exposure to other viral communicable diseases: Secondary | ICD-10-CM | POA: Diagnosis present

## 2018-09-21 DIAGNOSIS — J189 Pneumonia, unspecified organism: Secondary | ICD-10-CM | POA: Diagnosis present

## 2018-09-21 DIAGNOSIS — Z515 Encounter for palliative care: Secondary | ICD-10-CM | POA: Diagnosis not present

## 2018-09-21 DIAGNOSIS — E1122 Type 2 diabetes mellitus with diabetic chronic kidney disease: Secondary | ICD-10-CM | POA: Diagnosis present

## 2018-09-21 DIAGNOSIS — J9601 Acute respiratory failure with hypoxia: Secondary | ICD-10-CM | POA: Diagnosis present

## 2018-09-21 DIAGNOSIS — E785 Hyperlipidemia, unspecified: Secondary | ICD-10-CM | POA: Diagnosis present

## 2018-09-21 DIAGNOSIS — I4892 Unspecified atrial flutter: Secondary | ICD-10-CM | POA: Diagnosis present

## 2018-09-21 DIAGNOSIS — E876 Hypokalemia: Secondary | ICD-10-CM | POA: Diagnosis present

## 2018-09-21 DIAGNOSIS — I442 Atrioventricular block, complete: Secondary | ICD-10-CM | POA: Diagnosis present

## 2018-09-21 DIAGNOSIS — T39395A Adverse effect of other nonsteroidal anti-inflammatory drugs [NSAID], initial encounter: Secondary | ICD-10-CM | POA: Diagnosis present

## 2018-09-21 DIAGNOSIS — I130 Hypertensive heart and chronic kidney disease with heart failure and stage 1 through stage 4 chronic kidney disease, or unspecified chronic kidney disease: Secondary | ICD-10-CM | POA: Diagnosis not present

## 2018-09-21 DIAGNOSIS — R809 Proteinuria, unspecified: Secondary | ICD-10-CM | POA: Diagnosis present

## 2018-09-21 DIAGNOSIS — L8990 Pressure ulcer of unspecified site, unspecified stage: Secondary | ICD-10-CM | POA: Diagnosis present

## 2018-09-21 DIAGNOSIS — Z794 Long term (current) use of insulin: Secondary | ICD-10-CM

## 2018-09-21 DIAGNOSIS — C3431 Malignant neoplasm of lower lobe, right bronchus or lung: Secondary | ICD-10-CM | POA: Diagnosis not present

## 2018-09-21 DIAGNOSIS — C787 Secondary malignant neoplasm of liver and intrahepatic bile duct: Secondary | ICD-10-CM

## 2018-09-21 DIAGNOSIS — I313 Pericardial effusion (noninflammatory): Secondary | ICD-10-CM | POA: Diagnosis present

## 2018-09-21 DIAGNOSIS — M109 Gout, unspecified: Secondary | ICD-10-CM | POA: Diagnosis present

## 2018-09-21 DIAGNOSIS — Z66 Do not resuscitate: Secondary | ICD-10-CM | POA: Diagnosis present

## 2018-09-21 DIAGNOSIS — E875 Hyperkalemia: Secondary | ICD-10-CM | POA: Diagnosis present

## 2018-09-21 DIAGNOSIS — F329 Major depressive disorder, single episode, unspecified: Secondary | ICD-10-CM | POA: Diagnosis present

## 2018-09-21 DIAGNOSIS — K769 Liver disease, unspecified: Secondary | ICD-10-CM | POA: Diagnosis present

## 2018-09-21 DIAGNOSIS — Z79891 Long term (current) use of opiate analgesic: Secondary | ICD-10-CM

## 2018-09-21 DIAGNOSIS — Z79899 Other long term (current) drug therapy: Secondary | ICD-10-CM

## 2018-09-21 DIAGNOSIS — E872 Acidosis: Secondary | ICD-10-CM | POA: Diagnosis present

## 2018-09-21 DIAGNOSIS — R918 Other nonspecific abnormal finding of lung field: Secondary | ICD-10-CM | POA: Diagnosis present

## 2018-09-21 DIAGNOSIS — E8770 Fluid overload, unspecified: Secondary | ICD-10-CM | POA: Diagnosis not present

## 2018-09-21 DIAGNOSIS — Z950 Presence of cardiac pacemaker: Secondary | ICD-10-CM

## 2018-09-21 DIAGNOSIS — Z7952 Long term (current) use of systemic steroids: Secondary | ICD-10-CM

## 2018-09-21 DIAGNOSIS — Z7984 Long term (current) use of oral hypoglycemic drugs: Secondary | ICD-10-CM

## 2018-09-21 DIAGNOSIS — I5023 Acute on chronic systolic (congestive) heart failure: Secondary | ICD-10-CM

## 2018-09-21 DIAGNOSIS — N183 Chronic kidney disease, stage 3 (moderate): Secondary | ICD-10-CM | POA: Diagnosis present

## 2018-09-21 DIAGNOSIS — Z7189 Other specified counseling: Secondary | ICD-10-CM | POA: Diagnosis not present

## 2018-09-21 DIAGNOSIS — Z7982 Long term (current) use of aspirin: Secondary | ICD-10-CM

## 2018-09-21 LAB — CBC WITH DIFFERENTIAL/PLATELET
Abs Immature Granulocytes: 0.13 10*3/uL — ABNORMAL HIGH (ref 0.00–0.07)
Basophils Absolute: 0 10*3/uL (ref 0.0–0.1)
Basophils Relative: 0 %
Eosinophils Absolute: 0.1 10*3/uL (ref 0.0–0.5)
Eosinophils Relative: 1 %
HCT: 43.3 % (ref 39.0–52.0)
Hemoglobin: 13.7 g/dL (ref 13.0–17.0)
Immature Granulocytes: 1 %
Lymphocytes Relative: 7 %
Lymphs Abs: 0.7 10*3/uL (ref 0.7–4.0)
MCH: 30.3 pg (ref 26.0–34.0)
MCHC: 31.6 g/dL (ref 30.0–36.0)
MCV: 95.8 fL (ref 80.0–100.0)
Monocytes Absolute: 0.8 10*3/uL (ref 0.1–1.0)
Monocytes Relative: 7 %
Neutro Abs: 8.8 10*3/uL — ABNORMAL HIGH (ref 1.7–7.7)
Neutrophils Relative %: 84 %
Platelets: 181 10*3/uL (ref 150–400)
RBC: 4.52 MIL/uL (ref 4.22–5.81)
RDW: 14.3 % (ref 11.5–15.5)
WBC: 10.6 10*3/uL — ABNORMAL HIGH (ref 4.0–10.5)
nRBC: 0 % (ref 0.0–0.2)

## 2018-09-21 LAB — BRAIN NATRIURETIC PEPTIDE: B Natriuretic Peptide: 95 pg/mL (ref 0.0–100.0)

## 2018-09-21 LAB — BASIC METABOLIC PANEL
Anion gap: 11 (ref 5–15)
BUN: 41 mg/dL — ABNORMAL HIGH (ref 8–23)
CO2: 31 mmol/L (ref 22–32)
Calcium: 8.5 mg/dL — ABNORMAL LOW (ref 8.9–10.3)
Chloride: 95 mmol/L — ABNORMAL LOW (ref 98–111)
Creatinine, Ser: 1.87 mg/dL — ABNORMAL HIGH (ref 0.61–1.24)
GFR calc Af Amer: 37 mL/min — ABNORMAL LOW (ref >60)
GFR calc non Af Amer: 32 mL/min — ABNORMAL LOW (ref >60)
Glucose, Bld: 129 mg/dL — ABNORMAL HIGH (ref 70–99)
Potassium: 3.9 mmol/L (ref 3.5–5.1)
Sodium: 137 mmol/L (ref 135–145)

## 2018-09-21 LAB — TROPONIN I (HIGH SENSITIVITY)
Troponin I (High Sensitivity): 28 ng/L — ABNORMAL HIGH (ref <18)
Troponin I (High Sensitivity): 29 ng/L — ABNORMAL HIGH (ref <18)

## 2018-09-21 LAB — SARS CORONAVIRUS 2 BY RT PCR (HOSPITAL ORDER, PERFORMED IN ~~LOC~~ HOSPITAL LAB): SARS Coronavirus 2: NEGATIVE

## 2018-09-21 LAB — GLUCOSE, CAPILLARY
Glucose-Capillary: 111 mg/dL — ABNORMAL HIGH (ref 70–99)
Glucose-Capillary: 116 mg/dL — ABNORMAL HIGH (ref 70–99)

## 2018-09-21 LAB — MRSA PCR SCREENING: MRSA by PCR: NEGATIVE

## 2018-09-21 MED ORDER — INSULIN ASPART 100 UNIT/ML ~~LOC~~ SOLN: 0.0000 [IU] | Freq: Every day | SUBCUTANEOUS | Status: DC

## 2018-09-21 MED ORDER — AZITHROMYCIN 250 MG PO TABS
500.0000 mg | ORAL_TABLET | Freq: Every day | ORAL | Status: DC
  Administered 2018-09-22 – 2018-09-23 (×2): 500 mg via ORAL
  Filled 2018-09-21: qty 1
  Filled 2018-09-21: qty 2

## 2018-09-21 MED ORDER — POLYETHYLENE GLYCOL 3350 17 G PO PACK: 17.0000 g | PACK | Freq: Every day | ORAL | Status: DC | PRN

## 2018-09-21 MED ORDER — MORPHINE SULFATE (PF) 4 MG/ML IV SOLN
4.0000 mg | Freq: Once | INTRAVENOUS | Status: AC
  Administered 2018-09-21: 12:00:00 4 mg via INTRAVENOUS
  Filled 2018-09-21: qty 1

## 2018-09-21 MED ORDER — ONDANSETRON HCL 4 MG PO TABS: 4.0000 mg | ORAL_TABLET | Freq: Four times a day (QID) | ORAL | Status: DC | PRN

## 2018-09-21 MED ORDER — INSULIN ASPART PROT & ASPART (70-30 MIX) 100 UNIT/ML ~~LOC~~ SUSP
10.0000 [IU] | Freq: Every day | SUBCUTANEOUS | Status: DC
  Administered 2018-09-22 – 2018-09-23 (×2): 10 [IU] via SUBCUTANEOUS
  Filled 2018-09-21 (×2): qty 10

## 2018-09-21 MED ORDER — FUROSEMIDE 10 MG/ML IJ SOLN
40.0000 mg | Freq: Two times a day (BID) | INTRAMUSCULAR | Status: DC
  Administered 2018-09-21 – 2018-09-23 (×3): 40 mg via INTRAVENOUS
  Filled 2018-09-21 (×4): qty 4

## 2018-09-21 MED ORDER — ACETAMINOPHEN 650 MG RE SUPP: 650.0000 mg | Freq: Four times a day (QID) | RECTAL | Status: DC | PRN

## 2018-09-21 MED ORDER — SODIUM CHLORIDE 0.9 % IV SOLN
2.0000 g | INTRAVENOUS | Status: DC
  Administered 2018-09-21 – 2018-09-22 (×2): 2 g via INTRAVENOUS
  Filled 2018-09-21 (×2): qty 2
  Filled 2018-09-21 (×2): qty 20

## 2018-09-21 MED ORDER — ONDANSETRON HCL 4 MG/2ML IJ SOLN: 4.0000 mg | Freq: Four times a day (QID) | INTRAMUSCULAR | Status: DC | PRN

## 2018-09-21 MED ORDER — FUROSEMIDE 10 MG/ML IJ SOLN
40.0000 mg | Freq: Once | INTRAMUSCULAR | Status: AC
  Administered 2018-09-21: 40 mg via INTRAVENOUS
  Filled 2018-09-21: qty 4

## 2018-09-21 MED ORDER — CYANOCOBALAMIN 1000 MCG/ML IJ SOLN: 1000.0000 ug | INTRAMUSCULAR | Status: DC

## 2018-09-21 MED ORDER — IOHEXOL 350 MG/ML SOLN
60.0000 mL | Freq: Once | INTRAVENOUS | Status: AC | PRN
  Administered 2018-09-21: 11:00:00 60 mL via INTRAVENOUS

## 2018-09-21 MED ORDER — ASPIRIN EC 81 MG PO TBEC
81.0000 mg | DELAYED_RELEASE_TABLET | Freq: Every day | ORAL | Status: DC
  Administered 2018-09-21 – 2018-09-23 (×3): 81 mg via ORAL
  Filled 2018-09-21 (×3): qty 1

## 2018-09-21 MED ORDER — TRAMADOL HCL 50 MG PO TABS
50.0000 mg | ORAL_TABLET | Freq: Three times a day (TID) | ORAL | Status: DC | PRN
  Administered 2018-09-21: 50 mg via ORAL
  Filled 2018-09-21: qty 1

## 2018-09-21 MED ORDER — PIPERACILLIN-TAZOBACTAM 3.375 G IVPB 30 MIN
3.3750 g | Freq: Once | INTRAVENOUS | Status: AC
  Administered 2018-09-21: 12:00:00 3.375 g via INTRAVENOUS
  Filled 2018-09-21: qty 50

## 2018-09-21 MED ORDER — ACETAMINOPHEN 325 MG PO TABS
650.0000 mg | ORAL_TABLET | Freq: Four times a day (QID) | ORAL | Status: DC | PRN
  Administered 2018-09-22: 650 mg via ORAL
  Filled 2018-09-21: qty 2

## 2018-09-21 MED ORDER — HYDROCODONE-ACETAMINOPHEN 5-325 MG PO TABS
1.0000 | ORAL_TABLET | ORAL | Status: DC | PRN
  Administered 2018-09-21: 23:00:00 2 via ORAL
  Administered 2018-09-22: 09:00:00 1 via ORAL
  Administered 2018-09-22: 03:00:00 2 via ORAL
  Filled 2018-09-21: qty 2
  Filled 2018-09-21: qty 1
  Filled 2018-09-21: qty 2

## 2018-09-21 MED ORDER — SIMVASTATIN 20 MG PO TABS
40.0000 mg | ORAL_TABLET | Freq: Every day | ORAL | Status: DC
  Administered 2018-09-21 – 2018-09-22 (×2): 40 mg via ORAL
  Filled 2018-09-21 (×2): qty 2

## 2018-09-21 MED ORDER — SODIUM CHLORIDE 0.9 % IV SOLN
500.0000 mg | Freq: Once | INTRAVENOUS | Status: AC
  Administered 2018-09-21: 13:00:00 500 mg via INTRAVENOUS
  Filled 2018-09-21: qty 500

## 2018-09-21 MED ORDER — HEPARIN SODIUM (PORCINE) 5000 UNIT/ML IJ SOLN
5000.0000 [IU] | Freq: Three times a day (TID) | INTRAMUSCULAR | Status: DC
  Administered 2018-09-21 – 2018-09-23 (×5): 5000 [IU] via SUBCUTANEOUS
  Filled 2018-09-21 (×5): qty 1

## 2018-09-21 MED ORDER — IPRATROPIUM-ALBUTEROL 0.5-2.5 (3) MG/3ML IN SOLN
3.0000 mL | Freq: Three times a day (TID) | RESPIRATORY_TRACT | Status: DC
  Administered 2018-09-21 – 2018-09-23 (×5): 3 mL via RESPIRATORY_TRACT
  Filled 2018-09-21 (×5): qty 3

## 2018-09-21 MED ORDER — INSULIN ASPART 100 UNIT/ML ~~LOC~~ SOLN
0.0000 [IU] | Freq: Three times a day (TID) | SUBCUTANEOUS | Status: DC
  Administered 2018-09-22: 2 [IU] via SUBCUTANEOUS
  Administered 2018-09-22 – 2018-09-23 (×2): 1 [IU] via SUBCUTANEOUS
  Filled 2018-09-21 (×3): qty 1

## 2018-09-21 NOTE — Consult Note (Signed)
[facility] CONSULT NOTE  Patient Care Team: Glendon Axe, MD as PCP - General (Internal Medicine)  CHIEF COMPLAINTS/PURPOSE OF CONSULTATION: Worsening shortness of breath/worsening lower extremity swelling lung mass liver mass  HISTORY OF PRESENTING ILLNESS:  Javier Wong 85 y.o.  male pleasant but unfortunate patient with recent admission for acute renal failure/noted to have right lower lobe lung mass bulky mediastinal adenopathy-also liver lesions.  Patient underwent biopsy of the liver lesion on July 31.  At discharge patient was recommended rehab; however patient chose to go home.  Patient's son had been visiting him from [place].  Patient appointment to see me in the clinic in 2 days from now to go over the pathology.  However, patient noted to have worsening shortness of breath/weeping edema bilateral lower extremities over the last 2 days or so.  No fever.  However worsening shortness of breath or cough.  Overall feeling poorly.  Patient was admitted to hospital for above reasons.  Further work-up in the ER included a CTA of the chest that did not show any evidence of PE; however showed right lower lobe mass/postobstructive pneumonia/atelectasis; bulky mediastinal adenopathy and multiple liver lesions.  Patient's creatinine on admission was 1.8.   Patient is currently resting in stretcher comfortably.  Obviously he is quiet depressed given the turn of events in the last few weeks.   Review of Systems  Constitutional: Positive for malaise/fatigue. Negative for chills, diaphoresis, fever and weight loss.  HENT: Negative for nosebleeds and sore throat.   Eyes: Negative for double vision.  Respiratory: Positive for shortness of breath. Negative for cough, hemoptysis, sputum production and wheezing.   Cardiovascular: Negative for chest pain, palpitations, orthopnea and leg swelling.  Gastrointestinal: Negative for abdominal pain, blood in stool, constipation,  diarrhea, heartburn, melena, nausea and vomiting.  Genitourinary: Negative for dysuria, frequency and urgency.  Musculoskeletal: Positive for back pain. Negative for joint pain.  Skin: Negative.  Negative for itching and rash.       Bilateral lower extremity swelling/weeping edema  Neurological: Negative for dizziness, tingling, focal weakness, weakness and headaches.  Endo/Heme/Allergies: Does not bruise/bleed easily.  Psychiatric/Behavioral: Negative for depression. The patient is not nervous/anxious and does not have insomnia.      MEDICAL HISTORY:  Past Medical History:  Diagnosis Date  . Chronic kidney disease   . Diabetes type 2, controlled ([place])   . Hypertension    SURGICAL HISTORY: Past Surgical History:  Procedure Laterality Date  . PACEMAKER IMPLANT      SOCIAL HISTORY: Social History   Socioeconomic History  . Marital status: Married    Spouse name: Not on file  . Number of children: Not on file  . Years of education: Not on file  . Highest education level: Not on file  Occupational History  . Not on file  Social Needs  . Financial resource strain: Not on file  . Food insecurity    Worry: Not on file    Inability: Not on file  . Transportation needs    Medical: Not on file    Non-medical: Not on file  Tobacco Use  . Smoking status: Never Smoker  . Smokeless tobacco: Never Used  Substance and Sexual Activity  . Alcohol use: Never    Frequency: Never  . Drug use: Never  . Sexual activity: Not Currently  Lifestyle  . Physical activity    Days per week: Not on file    Minutes per session: Not on file  .  Stress: Not on file  Relationships  . Social Herbalist on phone: Not on file    Gets together: Not on file    Attends religious service: Not on file    Active member of club or organization: Not on file    Attends meetings of clubs or organizations: Not on file    Relationship status: Not on file  . Intimate partner violence    Fear of  current or ex partner: Not on file    Emotionally abused: Not on file    Physically abused: Not on file    Forced sexual activity: Not on file  Other Topics Concern  . Not on file  Social History Narrative   Patient is alone.  He is fairly active for his age.  Denies any prior history of smoking.  No alcohol.  He admits to exposure to asbestos.     FAMILY HISTORY: Family History  Problem Relation Age of Onset  . Mesothelioma Father     ALLERGIES:  has No Known Allergies.  MEDICATIONS:  Current Facility-Administered Medications  Medication Dose Route Frequency Provider Last Rate Last Dose  . acetaminophen (TYLENOL) tablet 650 mg  650 mg Oral Q6H PRN Fritzi Mandes, MD       Or  . acetaminophen (TYLENOL) suppository 650 mg  650 mg Rectal Q6H PRN Fritzi Mandes, MD      . aspirin EC tablet 81 mg  81 mg Oral Daily Fritzi Mandes, MD   81 mg at 09/21/18 1824  . [START ON 09/22/2018] azithromycin (ZITHROMAX) tablet 500 mg  500 mg Oral Daily Fritzi Mandes, MD      . cefTRIAXone (ROCEPHIN) 2 g in sodium chloride 0.9 % 100 mL IVPB  2 g Intravenous Q24H Fritzi Mandes, MD 200 mL/hr at 09/21/18 1839    . [START ON 10/03/2018] cyanocobalamin ((VITAMIN B-12)) injection 1,000 mcg  1,000 mcg Intramuscular Q30 days Fritzi Mandes, MD      . furosemide (LASIX) injection 40 mg  40 mg Intravenous Q12H Fritzi Mandes, MD   40 mg at 09/21/18 1725  . heparin injection 5,000 Units  5,000 Units Subcutaneous Q8H Fritzi Mandes, MD      . HYDROcodone-acetaminophen (NORCO/VICODIN) 5-325 MG per tablet 1-2 tablet  1-2 tablet Oral Q4H PRN Fritzi Mandes, MD      . insulin aspart (novoLOG) injection 0-5 Units  0-5 Units Subcutaneous QHS Fritzi Mandes, MD      . Derrill Memo ON 09/22/2018] insulin aspart (novoLOG) injection 0-9 Units  0-9 Units Subcutaneous TID WC Fritzi Mandes, MD      . Derrill Memo ON 09/22/2018] insulin aspart protamine- aspart (NOVOLOG MIX 70/30) injection 10 Units  10 Units Subcutaneous Q breakfast Fritzi Mandes, MD      .  ipratropium-albuterol (DUONEB) 0.5-2.5 (3) MG/3ML nebulizer solution 3 mL  3 mL Inhalation TID Fritzi Mandes, MD      . ondansetron [facility]) tablet 4 mg  4 mg Oral Q6H PRN Fritzi Mandes, MD       Or  . ondansetron (ZOFRAN) injection 4 mg  4 mg Intravenous Q6H PRN Fritzi Mandes, MD      . polyethylene glycol (MIRALAX / GLYCOLAX) packet 17 g  17 g Oral Daily PRN Fritzi Mandes, MD      . simvastatin (ZOCOR) tablet 40 mg  40 mg Oral QHS Fritzi Mandes, MD      . traMADol Veatrice Bourbon) tablet 50 mg  50 mg Oral Q8H PRN Fritzi Mandes, MD          .  PHYSICAL EXAMINATION:  Vitals:   09/21/18 1700 09/21/18 1800  BP: (!) 148/73 111/70  Pulse: 71 73  Resp: 16 15  Temp: (!) 97.5 F (36.4 C)   SpO2: 98% 97%   Filed Weights   09/21/18 0931 09/21/18 1700  Weight: 244 lb (110.7 kg) 243 lb 6.2 oz (110.4 kg)    Physical Exam  Constitutional: He is oriented to person, place, and time and well-developed, well-nourished, and in no distress.  Elderly Caucasian male patient.  Obese.  Alone.  HENT:  Head: Normocephalic and atraumatic.  Mouth/Throat: Oropharynx is clear and moist. No oropharyngeal exudate.  Eyes: Pupils are equal, round, and reactive to light.  Neck: Normal range of motion. Neck supple.  Cardiovascular: Normal rate and regular rhythm.  Pulmonary/Chest: No respiratory distress. He has no wheezes.  Decreased air entry bilaterally.  Abdominal: Soft. Bowel sounds are normal. He exhibits no distension and no mass. There is no abdominal tenderness. There is no rebound and no guarding.  Musculoskeletal: Normal range of motion.        General: Edema present. No tenderness.     Comments: 3+ edema weeping bilateral lower extremities.  Neurological: He is alert and oriented to person, place, and time.  Skin: Skin is warm.  Psychiatric: Affect normal.     LABORATORY DATA:  I have reviewed the data as listed Lab Results  Component Value Date   WBC 10.6 (H) 09/21/2018   HGB 13.7 09/21/2018   HCT 43.3  09/21/2018   MCV 95.8 09/21/2018   PLT 181 09/21/2018   Recent Labs    09/10/18 1402  09/12/18 0727  09/15/18 0309 09/17/18 0639 09/21/18 0938  NA 130*   < > 131*   < > 138 139 137  K 6.0*   < > 4.7   < > 3.7 3.9 3.9  CL 97*   < > 95*   < > 97* 99 95*  CO2 16*   < > 23   < > 29 27 31   GLUCOSE 122*   < > 123*   < > 160* 159* 129*  BUN 70*   < > 72*   < > 38* 39* 41*  CREATININE 2.84*   < > 2.51*   < > 1.64* 1.70* 1.87*  CALCIUM 8.8*   < > 8.2*   < > 8.9 8.9 8.5*  GFRNONAA 20*   < > 23*   < > 38* 36* 32*  GFRAA 23*   < > 26*   < > 44* 42* 37*  PROT 7.7  --  6.2*  --   --   --   --   ALBUMIN 3.7  --  3.0*  --   --  3.4*  --   AST 19  --  18  --   --   --   --   ALT 14  --  15  --   --   --   --   ALKPHOS 71  --  64  --   --   --   --   BILITOT 1.0  --  0.8  --   --   --   --    < > = values in this interval not displayed.    RADIOGRAPHIC STUDIES: I have personally reviewed the radiological images as listed and agreed with the findings in the report. Ct Chest Wo Contrast  Result Date: 09/11/2018 CLINICAL DATA:  Increasing weakness, fatigue and nausea. EXAM: CT CHEST  WITHOUT CONTRAST TECHNIQUE: Multidetector CT imaging of the chest was performed following the standard protocol without IV contrast. COMPARISON:  Chest x-ray 09/10/2018 FINDINGS: Cardiovascular: The heart is normal in size for the patient's age. There is a moderate to large pericardial effusion. This appears to be simple fluid. The pacer wires are in good position without complicating features. There is mild tortuosity, ectasia and moderate atherosclerotic calcifications involving the thoracic aorta. Three-vessel coronary artery calcifications are noted. Mediastinum/Nodes: There is a very large subcarinal mass measuring at least 7.7 x 4.9 x 5.1 cm. It appears to be contiguous with a right lower lobe soft tissue mass in the as ago esophageal recess. This also continues down to the diaphragmatic crus and there is adjacent  probable nodal disease just above the celiac axis. A few other small scattered upper mediastinal lymph nodes. I do not see any definite direct involvement of the esophagus. There is some high attenuation material the esophagus which could be debris or pills. Lungs/Pleura: Suspect right lower lobe mass in the as ago esophageal recess contiguous with the large mediastinal mass or adenopathy. Moderate bilateral pleural effusions along with fluid in both major fissures. There is an ill-defined 16 mm lesion in the right middle lobe which is indeterminate. Moderate vascular congestion and probable mild perihilar pulmonary edema. Upper Abdomen: Advanced cirrhotic changes involving the liver with a very irregular liver contour, dilated hepatic fissures and increased caudate to right lobe ratio. No obvious hepatic lesions without contrast. No splenomegaly. 2 cm nodal lesion noted just above the celiac axis and adjacent to the caudate lobe of the liver. Musculoskeletal: Indeterminate subcutaneous lesion involving the posterior lower thorax. Smaller adjacent lesion is also indeterminate. These could be benign complex sebaceous cysts. Mild symmetric bilateral gynecomastia. Left-sided permanent pacemaker without complicating features. Advanced degenerative changes involving the spine but no worrisome bone lesions. IMPRESSION: 1. 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 PET-CT or biopsy may be helpful for further evaluation. 2. Moderate-sized bilateral pleural effusions. 3. Moderate to large pericardial effusion. 4. Indeterminate 16 mm nodule in the right middle lobe, possibly a metastatic focus. 5. Advanced cirrhotic changes involving the liver without definite hepatic lesions. Aortic Atherosclerosis (ICD10-I70.0). Electronically Signed   By: Marijo Sanes M.D.   On: 09/11/2018 13:51   Ct Angio  Chest Pe W And/or Wo Contrast  Result Date: 09/21/2018 CLINICAL DATA:  Shortness of breath.  Lower extremity edema EXAM: CT ANGIOGRAPHY CHEST WITH CONTRAST TECHNIQUE: Multidetector CT imaging of the chest was performed using the standard protocol during bolus administration of intravenous contrast. Multiplanar CT image reconstructions and MIPs were obtained to evaluate the vascular anatomy. CONTRAST:  57mL OMNIPAQUE IOHEXOL 350 MG/ML SOLN COMPARISON:  Chest CT September 11, 2018 FINDINGS: Cardiovascular: There is no demonstrable pulmonary embolus. There is no evident thoracic aortic aneurysm or dissectionww. The visualized great vessels appear unremarkable. There is aortic atherosclerosis. There are foci of coronary artery calcification. There is a moderate pericardial effusion. Pacemaker leads are attached to the right atrium and right ventricle. Mediastinum/Nodes: Thyroid appears unremarkable. There is a mass which extends from the subcarinal region to the level of the right hemidiaphragm. This large mass measures 12.4 x 7.1 x 5.7 cm. It is difficult to assess whether this mass arises from the mediastinum versus lung parenchyma. There may be subcarinal adenopathy indistinguishable from adjacent mass. Note that  this mass extends through the azygoesophageal recess, similar to recent CT. No other findings suggesting adenopathy. The subcarinal region mass impresses upon the esophagus but does not convincingly invade the esophagus. There is moderate fluid and air in the esophagus which may be secondary to compression or possibly a degree of esophageal invasion distally. Lungs/Pleura: Moderate free-flowing pleural effusions are evident. There is consolidation in the right lower lobe felt to represent pneumonitis, separate from the questionable mass arising in the right lower lobe and extending into the subcarinal region. A nodular lesion in the lateral segment of the right lower lobe is again noted, measuring 2.0 x 1.4 cm,  a potential metastasis. Upper Abdomen: There are multiple liver masses, likely due to metastatic foci. Liver contour is indicative of cirrhosis. Visualized upper abdominal structures otherwise appear unremarkable. Musculoskeletal: There is extensive thoracic arthropathy with diffuse idiopathic skeletal hyperostosis. No blastic or lytic bone lesions are evident. No chest wall lesions are evident. There is advanced arthropathy in each shoulder. Note that pacemaker is present on the left anteriorly. Review of the MIP images confirms the above findings. IMPRESSION: 1. No demonstrable pulmonary embolus. No thoracic aortic aneurysm or dissection. There is aortic atherosclerosis. There are foci of coronary artery calcification. 2. The large mass involving both right upper lobe and subcarinal region again noted. It is difficult to ascertain whether this mass arises from the subcarinal mediastinum and extends in the right lung versus extending from the right lung into the mediastinum. This mass impresses upon and may invade the esophagus. It measures 12.4 x 7.1 x 5.7 cm. 3. Probable metastasis in the right middle lobe measuring 2.0 x 1.4 cm. 4. Multiple liver metastases evident. There is underlying hepatic cirrhosis. 5.  Moderate pericardial effusion. 6. Bilateral pleural effusions with areas of consolidation felt to represent pneumonia in the right lower lobe. 7.  Pacemaker leads attached to right atrium and right ventricle. Aortic Atherosclerosis (ICD10-I70.0). Electronically Signed   By: Lowella Grip III M.D.   On: 09/21/2018 11:26   US Renal  Result Date: 09/11/2018 CLINICAL DATA:  Acute kidney injury EXAM: RENAL / URINARY TRACT ULTRASOUND COMPLETE COMPARISON:  None. FINDINGS: Right Kidney: Renal measurements: 11.0 x 5.4 x 5.1 cm = volume: 159 mL. Slightly increased echotexture diffusely. Cortical thinning. No mass or hydronephrosis Left Kidney: Renal measurements: 11.2 x 5.9 x 5.7 cm = volume: 200 mL. Slightly  increased echotexture diffusely with cortical thinning. No mass or hydronephrosis. Bladder: Appears normal for degree of bladder distention. Small amount of ascites seen within the abdomen. IMPRESSION: Increased echotexture and cortical thinning compatible with chronic medical renal disease. No acute findings. No hydronephrosis. Small amount of ascites incidentally noted. Electronically Signed   By: Rolm Baptise M.D.   On: 09/11/2018 10:52   Nm Pet Image Initial (pi) Skull Base To Thigh  Result Date: 09/15/2018 CLINICAL DATA:  Initial treatment strategy for lung mass. EXAM: NUCLEAR MEDICINE PET SKULL BASE TO THIGH TECHNIQUE: 14.0 mCi F-18 FDG was injected intravenously. Full-ring PET imaging was performed from the skull base to thigh after the radiotracer. CT data was obtained and used for attenuation correction and anatomic localization. Fasting blood glucose: 124 mg/dl COMPARISON:  Chest CT 09/11/2018 FINDINGS: Mediastinal blood pool activity: SUV max 2.4 Liver activity: SUV max NA NECK: No hypermetabolic lymph nodes in the neck. Incidental CT findings: none CHEST: 6.8 x 5.1 cm soft tissue mass is identified in the infrahilar aspect of the medial right lower lobe with extension of soft tissue into  the mediastinum/subcarinal station. This lesion is markedly hypermetabolic with SUV max = 81.4. No definite evidence for contralateral hypermetabolic hilar lymphadenopathy. No hypermetabolic lymphadenopathy in the axillary regions. Hypermetabolic soft tissue is identified in the region of the right diaphragmatic crus/esophageal hiatus and extending posterolaterally on the right. This abnormal soft tissue is markedly hypermetabolic with SUV max = 48.1. The portion of this mass anterior to the descending aorta on 01/30 2/3 measures 4.1 x 6.2 cm. I think the esophagus is deflected to the left of this lesion, but anatomy in this region is poorly demonstrated on noncontrast imaging. I cannot exclude that this lesion  arises from the esophagus. Incidental CT findings: Left-sided permanent pacemaker noted. The heart is enlarged. Small pericardial effusion is similar to prior. Coronary artery calcification is evident. Atherosclerotic calcification is noted in the wall of the thoracic aorta. Bilateral collapse/consolidation noted in the lower lungs with small to moderate bilateral pleural effusions. ABDOMEN/PELVIS: Uptake in the liver parenchyma is very mottled which could obscure hypermetabolic small liver lesions. There is a subtle hypoattenuating focus in the lateral segment left liver (149/3) that shows a more prominent degree of FDG accumulation ( SUV max = 6.1.) than other areas of the liver, suggesting that this represents a real hypermetabolic liver lesion/metastasis. 2.4 cm lymph node just cranial to the celiac axis is hypermetabolic with SUV max = 85.6. 1.6 cm soft tissue nodule lateral to the lower pole left kidney shows no hypermetabolism. Incidental CT findings: Nodular liver contour compatible with cirrhosis. There is abdominal aortic atherosclerosis without aneurysm. SKELETON: Diffusely mottled uptake identified in the marrow space with focal hypermetabolism identified in the posterior cortex of the proximal right femur ( SUV max = 7.8). This corresponds to a subtle area of cortical lucency visible on 269/3. Incidental CT findings: Degenerative changes noted in the shoulders bilaterally. IMPRESSION: 1. The infrahilar right lung lesion, filling the as ago esophageal recess extending up into the subcarinal region is markedly hypermetabolic consistent with neoplasm. Bulky soft tissue lesions adjacent/involving the distal esophagus and extending posteriorly along the right diaphragmatic crus are not well demonstrated on this noncontrast exam, but are markedly hypermetabolic. This disease appears to deflect the esophagus to the left, but esophageal involvement/origin of the neoplasm not excluded. 2. Probable hypermetabolic  metastasis in the lateral segment left liver, poorly demonstrated on noncontrast CT imaging. 3. 2.4 cm hypermetabolic lymph node just cranial to the celiac axis consistent with metastatic involvement. 4. Hypermetabolic lucent lesion in the posterior cortex of the proximal right femur, concerning for metastatic involvement. 5. Bibasilar collapse/consolidation in the lungs with small to moderate bilateral pleural effusions. 6. Nodular hepatic contour consistent with cirrhosis. 7. Small pericardial effusion. 8.  Aortic Atherosclerois (ICD10-170.0) Electronically Signed   By: Misty Stanley M.D.   On: 09/15/2018 14:14   US Biopsy (liver)  Result Date: 09/18/2018 INDICATION: 85 year old male with a history of liver mass EXAM: ULTRASOUND-GUIDED RIGHT LIVER MASS BIOPSY MEDICATIONS: None. ANESTHESIA/SEDATION: Moderate (conscious) sedation was employed during this procedure. A total of Versed 2.0 mg and Fentanyl 50 mcg was administered intravenously. Moderate Sedation Time: 11 minutes. The patient's level of consciousness and vital signs were monitored continuously by radiology nursing throughout the procedure under my direct supervision. FLUOROSCOPY TIME:  None COMPLICATIONS: None PROCEDURE: Informed written consent was obtained from the patient after a thorough discussion of the procedural risks, benefits and alternatives. All questions were addressed. Maximal Sterile Barrier Technique was utilized including caps, mask, sterile gowns, sterile gloves, sterile drape, hand hygiene  and skin antiseptic. A timeout was performed prior to the initiation of the procedure. Patient positioned supine position on the ultrasound stretcher. Images were stored sent to PACs. The patient is prepped and draped in the usual sterile fashion. 1% lidocaine was used for local anesthesia. Using ultrasound guidance, guide needle was advanced into the heterogeneously echoic lesion at the tip of segment 6. Once we confirmed needle position  multiple 18 gauge core biopsy were acquired. Patient tolerated the procedure well and remained hemodynamically stable throughout. No complications were encountered and no significant blood loss. IMPRESSION: Status post ultrasound-guided biopsy of right liver lesion, segment 6. Signed, Dulcy Fanny. Dellia Nims, RPVI [facility] Electronically Signed   By: Corrie Mckusick D.O.   On: 09/18/2018 12:03   Dg Chest Portable 1 View  Result Date: 09/21/2018 CLINICAL DATA:  Shortness of breath EXAM: PORTABLE CHEST 1 VIEW COMPARISON:  September 15, 2018 FINDINGS: There are small pleural effusions bilaterally with bibasilar atelectasis. There is no frank edema or consolidation. Heart is mildly enlarged with pulmonary vascularity normal. Pacemaker leads are attached to the right atrium and right ventricle. No adenopathy. There is arthropathy in each shoulder. IMPRESSION: Mild cardiomegaly. Pacemaker leads attached to right atrium and right ventricle. Small pleural effusions with bibasilar atelectasis. No edema or consolidation evident. Electronically Signed   By: Lowella Grip III M.D.   On: 09/21/2018 10:10   Dg Chest Port 1 View  Result Date: 09/15/2018 CLINICAL DATA:  85 year old male patient non-smoker/mild CKD is currently admitted to hospital for worsening insufficiency/right lower lobe lung mass/bulky mediastinal adenopathy. Hx of HTN and DM. Hx of pacemaker. EXAM: PORTABLE CHEST 1 VIEW COMPARISON:  09/13/2018 and earlier exams. FINDINGS: Bilateral pleural effusions obscure the hemidiaphragms, extending into the oblique fissure on the right, similar on the left and mildly increased on the right from the most recent prior exam. Associated basilar lung opacity is consistent with atelectasis, similar to the prior study. Upper lungs remain clear. There is no evidence of pulmonary edema. Cardiac silhouette is mildly enlarged, stable. Stable left anterior chest wall  sequential pacemaker. No pneumothorax. IMPRESSION: 1. Mild interval increase in the size of the right pleural effusion with stable appearance of the left pleural effusion. Stable associated lung base atelectasis. 2. No convincing pneumonia or pulmonary edema. 3. No change in the enlargement of the cardiopericardial silhouette. Electronically Signed   By: Lajean Manes M.D.   On: 09/15/2018 13:19   Dg Chest Port 1 View  Result Date: 09/13/2018 CLINICAL DATA:  85 year old male with history of shortness of breath today. EXAM: PORTABLE CHEST 1 VIEW COMPARISON:  Chest x-ray 09/10/2018. FINDINGS: Small bilateral pleural effusions. Bibasilar opacities which may reflect areas of atelectasis and/or consolidation. Cephalization of the pulmonary vasculature. Enlargement of the cardiopericardial silhouette. Upper mediastinal contours are within normal limits. Aortic atherosclerosis. Left-sided pacemaker with lead tips projecting over the expected location of the right atrium and right ventricle. IMPRESSION: 1. Enlargement of the cardiopericardial silhouette related to known pericardial effusion (better demonstrated on recent chest CT). 2. Small bilateral pleural effusions with bibasilar opacities favored to reflect areas of atelectasis and/or consolidation. 3. Pulmonary venous congestion, without frank pulmonary edema. Electronically Signed   By: Vinnie Langton M.D.   On: 09/13/2018 09:25   Dg Chest Portable 1 View  Result Date: 09/10/2018 CLINICAL DATA:  Shortness of breath. EXAM: PORTABLE CHEST 1 VIEW COMPARISON:  Radiograph of April 06, 2010. FINDINGS: Stable cardiomegaly. Left-sided pacemaker is unchanged in  position. No pneumothorax is noted. No significant pleural effusion is noted. Minimal bibasilar subsegmental atelectasis is noted. Degenerative changes are seen involving both glenohumeral joints. IMPRESSION: Minimal bibasilar subsegmental atelectasis. Electronically Signed   By: Marijo Conception M.D.   On:  09/10/2018 14:47   US Abdomen Limited Ruq  Result Date: 09/17/2018 CLINICAL DATA:  Infrahilar right lung mass. Hypermetabolic lateral left hepatic lesions suspected on PET-CT. Preop planning for possible biopsy EXAM: ULTRASOUND ABDOMEN LIMITED RIGHT UPPER QUADRANT COMPARISON:  PET-CT 09/15/2018 and previous FINDINGS: Gallbladder: Layering sludge. No wall thickening or pericholecystic fluid. Sonographer describes no sonographic Murphy's sign. Common bile duct: Diameter: 4.4 mm, unremarkable Liver: 2.3 x 2.2 x 2 cm lesion in the lateral left hepatic segment 1.4 x 1.3 cm slightly hyperechoic nodule in hepatic segment 5 2.3 x 2.1 by 2 cm slightly echogenic lesion in the anterior right hepatic lobe. These correspond to hypermetabolic foci on recent PET-CT. Background liver parenchyma is markedly heterogenous. Nodular liver contour. Portal vein is patent on color Doppler imaging with normal direction of blood flow towards the liver. Other: Trace perihepatic ascites IMPRESSION: 1. Liver lesions corresponding to hypermetabolic foci on PET-CT. These should be approachable for ultrasound-guided core liver lesion biopsy. 2. Nodular hepatic contour with heterogenous echotexture consistent with cirrhosis. Electronically Signed   By: Lucrezia Europe M.D.   On: 09/17/2018 08:20    Mass of lower lobe of right lung 85 year old male patient with multiple medical problems including a right lower lobe mass/bulky mediastinal adenopathy/multiple liver lesions; CKD is currently admitted to hospital for worsening shortness of breath/worsening swelling in the legs  #Right lower lobe lung mass/bulky mediastinal adenopathy/pericardial effusion/liver lesions-highly suspicious for malignant process.  Liver biopsy [done on July 31st]-preliminary high-grade malignancy [dd: Lymphoma versus carcinoma]; immunohistochemical stains-pending.   #Worsening respiratory status-multifactorial fluid overload/right lower lobe mass/adenopathy/question  postobstructive pneumonia-on broad-spectrum antibiotic/diuresis as per nephrology.  #Chronic kidney disease with a recent episode of acute renal failure; currently creatinine is 1.8.  Patient received IV contrast-we will need to monitor closely given the risk of worsening renal failure.  #Prognosis is poor.  Agree with palliative care evaluation.  Discussed and updated patient's son Jenny Reichmann over the phone-with diagnosis of malignancy; and worsening respiratory status/and overall poor prognosis given multiple comorbid illnesses.  Thank you Dr. Posey Pronto for allowing me to participate in the care of your pleasant patient. Please do not hesitate to contact me with questions or concerns in the interim.  Discussed with Dr. Posey Pronto.  All questions were answered. The patient knows to call the clinic with any problems, questions or concerns.    Cammie Sickle, MD 09/21/2018 7:35 PM

## 2018-09-21 NOTE — Progress Notes (Signed)
Only transfer calls to room if his son, Jenny Reichmann calls.

## 2018-09-21 NOTE — ED Notes (Signed)
Patient transported to CT 

## 2018-09-21 NOTE — Progress Notes (Signed)
   09/21/18 1000  Clinical Encounter Type  Visited With Patient;Health care provider  Visit Type Initial  Referral From Nurse  Consult/Referral To Chaplain  Spiritual Encounters  Spiritual Needs Prayer;Emotional  Stress Factors  Patient Stress Factors Health changes;Lack of knowledge   Chaplain received a referral from the patient's nurse to provide support to the patient. Upon arrival, the patient was resting in bed with his eyes open and supplemental oxygen in place. He was welcoming and expressed gratitude for the visit. The patient has returned to the hospital after being recently discharged. The patient said that he would have typically been one of those people who waited for issues to resolve, but feels much different now in the wake of his recent biopsy and concerns for possible cancer. The patient shared that he has taken "good care" of himself and has never smoked or drank, and yet he is faced with this possible diagnosis. This is understandably difficult for the patient. He expressed fears about his uncertain future and laments his current state of health after enjoying a "good life" so far. Chaplain provided support in the form of compassionate presence, active and reflective listening, and upon the patient's request, prayer. The patient is a person of faith who proclaimed, "all he can do is continue to to put it in God's hands." With, that the patient is grappling with existential issues (life, the meaning of life, death, illness, etc.).   Follow-Up: Continue to support (spiritual, emotional, etc.) the patient and provide a space for the patient to process his fears, concerns, anxieties around his health and the uncertainty of his future.

## 2018-09-21 NOTE — ED Notes (Signed)
ED TO INPATIENT HANDOFF REPORT  ED Nurse Name and Phone #: Sam [phone]  S Name/Age/Gender Javier Wong 85 y.o. male Room/Bed: ED16A/ED16A  Code Status   Code Status: Prior  Home/SNF/Other Home Patient oriented to: self, place, time and situation Is this baseline? Yes   Triage Complete: Triage complete  Chief Complaint shortness of breath   Triage Note Pt arrives via ems from home with complaints of shortness of breath and swelling that has increased over the last few days. EMS reports a room air saturation (pt's baseline) in the 80s, in triage pt 100% on 3L of supplemental o2.    Allergies No Known Allergies  Level of Care/Admitting Diagnosis ED Disposition    ED Disposition Condition [facility] Area: [place] [[id number]]  Level of Care: Telemetry [5]  Covid Evaluation: Confirmed COVID Negative  Diagnosis: Acute respiratory failure with hypoxemia [facility]) [[id number]]  Admitting Physician: Odessa Fleming  Attending Physician: Odessa Fleming  Estimated length of stay: past midnight tomorrow  Certification:: I certify this patient will need inpatient services for at least 2 midnights  PT Class (Do Not Modify): Inpatient [101]  PT Acc Code (Do Not Modify): Private [1]       B Medical/Surgery History Past Medical History:  Diagnosis Date  . Chronic kidney disease   . Diabetes type 2, controlled ([place])   . Hypertension    Past Surgical History:  Procedure Laterality Date  . PACEMAKER IMPLANT       A IV Location/Drains/Wounds Patient Lines/Drains/Airways Status   Active Line/Drains/Airways    Name:   Placement date:   Placement time:   Site:   Days:   Peripheral IV 09/21/18 Left Antecubital   09/21/18    0943    Antecubital   less than 1   External Urinary Catheter   09/21/18    1030    -   less than 1          Intake/Output Last 24 hours No intake or output data in the 24 hours ending 09/21/18  1600  Labs/Imaging Results for orders placed or performed during the hospital encounter of 09/21/18 (from the past 48 hour(s))  CBC with Differential     Status: Abnormal   Collection Time: 09/21/18  9:38 AM  Result Value Ref Range   WBC 10.6 (H) 4.0 - 10.5 K/uL   RBC 4.52 4.22 - 5.81 MIL/uL   Hemoglobin 13.7 13.0 - 17.0 g/dL   HCT 43.3 39.0 - 52.0 %   MCV 95.8 80.0 - 100.0 fL   MCH 30.3 26.0 - 34.0 pg   MCHC 31.6 30.0 - 36.0 g/dL   RDW 14.3 11.5 - 15.5 %   Platelets 181 150 - 400 K/uL   nRBC 0.0 0.0 - 0.2 %   Neutrophils Relative % 84 %   Neutro Abs 8.8 (H) 1.7 - 7.7 K/uL   Lymphocytes Relative 7 %   Lymphs Abs 0.7 0.7 - 4.0 K/uL   Monocytes Relative 7 %   Monocytes Absolute 0.8 0.1 - 1.0 K/uL   Eosinophils Relative 1 %   Eosinophils Absolute 0.1 0.0 - 0.5 K/uL   Basophils Relative 0 %   Basophils Absolute 0.0 0.0 - 0.1 K/uL   Immature Granulocytes 1 %   Abs Immature Granulocytes 0.13 (H) 0.00 - 0.07 K/uL    Comment: Performed at [facility], [address], [place], [place] [address]  Basic metabolic panel  Status: Abnormal   Collection Time: 09/21/18  9:38 AM  Result Value Ref Range   Sodium 137 135 - 145 mmol/L   Potassium 3.9 3.5 - 5.1 mmol/L   Chloride 95 (L) 98 - 111 mmol/L   CO2 31 22 - 32 mmol/L   Glucose, Bld 129 (H) 70 - 99 mg/dL   BUN 41 (H) 8 - 23 mg/dL   Creatinine, Ser 1.87 (H) 0.61 - 1.24 mg/dL   Calcium 8.5 (L) 8.9 - 10.3 mg/dL   GFR calc non Af Amer 32 (L) >60 mL/min   GFR calc Af Amer 37 (L) >60 mL/min   Anion gap 11 5 - 15    Comment: Performed at [facility], [place], [place] [address]  Troponin I (High Sensitivity)     Status: Abnormal   Collection Time: 09/21/18  9:38 AM  Result Value Ref Range   Troponin I (High Sensitivity) 28 (H) <18 ng/L    Comment: (NOTE) Elevated high sensitivity troponin I (hsTnI) values and significant  changes across serial measurements may suggest ACS but many other  chronic  and acute conditions are known to elevate hsTnI results.  Refer to the "Links" section for chest pain algorithms and additional  guidance. Performed at [facility], [address] [place], [place] [address]   Brain natriuretic peptide     Status: None   Collection Time: 09/21/18  9:38 AM  Result Value Ref Range   B Natriuretic Peptide 95.0 0.0 - 100.0 pg/mL    Comment: Performed at [facility], [place], [place] [address]  Troponin I (High Sensitivity)     Status: Abnormal   Collection Time: 09/21/18 11:18 AM  Result Value Ref Range   Troponin I (High Sensitivity) 29 (H) <18 ng/L    Comment: (NOTE) Elevated high sensitivity troponin I (hsTnI) values and significant  changes across serial measurements may suggest ACS but many other  chronic and acute conditions are known to elevate hsTnI results.  Refer to the "Links" section for chest pain algorithms and additional  guidance. Performed at [facility], [address] [place], [place] [address]   SARS Coronavirus 2 [facility] order, Performed in [facility] hospital lab) Nasopharyngeal Nasopharyngeal Swab     Status: None   Collection Time: 09/21/18 11:40 AM   Specimen: Nasopharyngeal Swab  Result Value Ref Range   SARS Coronavirus 2 NEGATIVE NEGATIVE    Comment: (NOTE) If result is NEGATIVE SARS-CoV-2 target nucleic acids are NOT DETECTED. The SARS-CoV-2 RNA is generally detectable in upper and lower  respiratory specimens during the acute phase of infection. The lowest  concentration of SARS-CoV-2 viral copies this assay can detect is 250  copies / mL. A negative result does not preclude SARS-CoV-2 infection  and should not be used as the sole basis for treatment or other  patient management decisions.  A negative result may occur with  improper specimen collection / handling, submission of specimen other  than nasopharyngeal swab, presence of viral mutation(s) within the  areas  targeted by this assay, and inadequate number of viral copies  (<250 copies / mL). A negative result must be combined with clinical  observations, patient history, and epidemiological information. If result is POSITIVE SARS-CoV-2 target nucleic acids are DETECTED. The SARS-CoV-2 RNA is generally detectable in upper and lower  respiratory specimens dur ing the acute phase of infection.  Positive  results are indicative of active infection with SARS-CoV-2.  Clinical  correlation with patient history and other diagnostic information is  necessary to determine patient infection status.  Positive results do  not rule out bacterial infection or co-infection with other viruses. If result is PRESUMPTIVE POSTIVE SARS-CoV-2 nucleic acids MAY BE PRESENT.   A presumptive positive result was obtained on the submitted specimen  and confirmed on repeat testing.  While 2019 novel coronavirus  (SARS-CoV-2) nucleic acids may be present in the submitted sample  additional confirmatory testing may be necessary for epidemiological  and / or clinical management purposes  to differentiate between  SARS-CoV-2 and other Sarbecovirus currently known to infect humans.  If clinically indicated additional testing with an alternate test  methodology [phone]) is advised. The SARS-CoV-2 RNA is generally  detectable in upper and lower respiratory sp ecimens during the acute  phase of infection. The expected result is Negative. Fact Sheet for Patients:  StrictlyIdeas.no Fact Sheet for Healthcare Providers: BankingDealers.co.za This test is not yet approved or cleared by the [place] FDA and has been authorized for detection and/or diagnosis of SARS-CoV-2 by FDA under an Emergency Use Authorization (EUA).  This EUA will remain in effect (meaning this test can be used) for the duration of the COVID-19 declaration under Section 564(b)(1) of the Act, 21 U.S.C. section  360bbb-3(b)(1), unless the authorization is terminated or revoked sooner. Performed at [facility], [place], [place] [address]    Ct Angio Chest Pe W And/or Wo Contrast  Result Date: 09/21/2018 CLINICAL DATA:  Shortness of breath.  Lower extremity edema EXAM: CT ANGIOGRAPHY CHEST WITH CONTRAST TECHNIQUE: Multidetector CT imaging of the chest was performed using the standard protocol during bolus administration of intravenous contrast. Multiplanar CT image reconstructions and MIPs were obtained to evaluate the vascular anatomy. CONTRAST:  52mL OMNIPAQUE IOHEXOL 350 MG/ML SOLN COMPARISON:  Chest CT September 11, 2018 FINDINGS: Cardiovascular: There is no demonstrable pulmonary embolus. There is no evident thoracic aortic aneurysm or dissectionww. The visualized great vessels appear unremarkable. There is aortic atherosclerosis. There are foci of coronary artery calcification. There is a moderate pericardial effusion. Pacemaker leads are attached to the right atrium and right ventricle. Mediastinum/Nodes: Thyroid appears unremarkable. There is a mass which extends from the subcarinal region to the level of the right hemidiaphragm. This large mass measures 12.4 x 7.1 x 5.7 cm. It is difficult to assess whether this mass arises from the mediastinum versus lung parenchyma. There may be subcarinal adenopathy indistinguishable from adjacent mass. Note that this mass extends through the azygoesophageal recess, similar to recent CT. No other findings suggesting adenopathy. The subcarinal region mass impresses upon the esophagus but does not convincingly invade the esophagus. There is moderate fluid and air in the esophagus which may be secondary to compression or possibly a degree of esophageal invasion distally. Lungs/Pleura: Moderate free-flowing pleural effusions are evident. There is consolidation in the right lower lobe felt to represent pneumonitis, separate from the questionable mass  arising in the right lower lobe and extending into the subcarinal region. A nodular lesion in the lateral segment of the right lower lobe is again noted, measuring 2.0 x 1.4 cm, a potential metastasis. Upper Abdomen: There are multiple liver masses, likely due to metastatic foci. Liver contour is indicative of cirrhosis. Visualized upper abdominal structures otherwise appear unremarkable. Musculoskeletal: There is extensive thoracic arthropathy with diffuse idiopathic skeletal hyperostosis. No blastic or lytic bone lesions are evident. No chest wall lesions are evident. There is advanced arthropathy in each shoulder.  Note that pacemaker is present on the left anteriorly. Review of the MIP images confirms the above findings. IMPRESSION: 1. No demonstrable pulmonary embolus. No thoracic aortic aneurysm or dissection. There is aortic atherosclerosis. There are foci of coronary artery calcification. 2. The large mass involving both right upper lobe and subcarinal region again noted. It is difficult to ascertain whether this mass arises from the subcarinal mediastinum and extends in the right lung versus extending from the right lung into the mediastinum. This mass impresses upon and may invade the esophagus. It measures 12.4 x 7.1 x 5.7 cm. 3. Probable metastasis in the right middle lobe measuring 2.0 x 1.4 cm. 4. Multiple liver metastases evident. There is underlying hepatic cirrhosis. 5.  Moderate pericardial effusion. 6. Bilateral pleural effusions with areas of consolidation felt to represent pneumonia in the right lower lobe. 7.  Pacemaker leads attached to right atrium and right ventricle. Aortic Atherosclerosis (ICD10-I70.0). Electronically Signed   By: Lowella Grip III M.D.   On: 09/21/2018 11:26   Dg Chest Portable 1 View  Result Date: 09/21/2018 CLINICAL DATA:  Shortness of breath EXAM: PORTABLE CHEST 1 VIEW COMPARISON:  September 15, 2018 FINDINGS: There are small pleural effusions bilaterally with  bibasilar atelectasis. There is no frank edema or consolidation. Heart is mildly enlarged with pulmonary vascularity normal. Pacemaker leads are attached to the right atrium and right ventricle. No adenopathy. There is arthropathy in each shoulder. IMPRESSION: Mild cardiomegaly. Pacemaker leads attached to right atrium and right ventricle. Small pleural effusions with bibasilar atelectasis. No edema or consolidation evident. Electronically Signed   By: Lowella Grip III M.D.   On: 09/21/2018 10:10    Pending Labs [employer] (From admission, onward)    Start     Ordered   Signed and Held  CBC  (heparin)  Once,   R    Comments: Baseline for heparin therapy IF NOT ALREADY DRAWN.  Notify MD if PLT < 100 K.    Signed and Held   Signed and Held  Creatinine, serum  (heparin)  Once,   R    Comments: Baseline for heparin therapy IF NOT ALREADY DRAWN.    Signed and Held   Signed and Held  Basic metabolic panel  Tomorrow morning,   R     Signed and Held          Vitals/Pain Today's Vitals   09/21/18 1400 09/21/18 1415 09/21/18 1430 09/21/18 1436  BP: 127/69  (!) 141/77   Pulse: 70 82    Resp: 15 (!) 21 (!) 21   Temp:      TempSrc:      SpO2: 99% 97%    Weight:      Height:      PainSc:    Asleep    Isolation Precautions No active isolations  Medications Medications  furosemide (LASIX) injection 40 mg (has no administration in time range)  cefTRIAXone (ROCEPHIN) 2 g in sodium chloride 0.9 % 100 mL IVPB (has no administration in time range)  azithromycin (ZITHROMAX) tablet 500 mg (has no administration in time range)  iohexol (OMNIPAQUE) 350 MG/ML injection 60 mL (60 mLs Intravenous Contrast Given 09/21/18 1105)  morphine 4 MG/ML injection 4 mg (4 mg Intravenous Given 09/21/18 1139)  piperacillin-tazobactam (ZOSYN) IVPB 3.375 g (0 g Intravenous Stopped 09/21/18 1231)  azithromycin (ZITHROMAX) 500 mg in sodium chloride 0.9 % 250 mL IVPB (0 mg Intravenous Stopped 09/21/18 1332)   furosemide (LASIX) injection 40 mg (40 mg Intravenous  Given 09/21/18 1150)    Mobility walks with device High fall risk   Focused Assessments Pulmonary Assessment Handoff:  Lung sounds:   O2 Device: Nasal Cannula O2 Flow Rate (L/min): 3 L/min      R Recommendations: See Admitting Provider Note  Report given to:   Additional Notes:

## 2018-09-21 NOTE — H&P (Signed)
[facility] NAME: Javier Wong    MR#:  [id number]  DATE OF BIRTH:  17-Feb-1933  DATE OF ADMISSION:  09/21/2018  PRIMARY CARE PHYSICIAN: Glendon Axe, MD   REQUESTING/REFERRING PHYSICIAN: dr Blake Divine  CHIEF COMPLAINT:  having shortness of breath and my legs hurt with blisters on the right leg  HISTORY OF PRESENT ILLNESS:  Javier Wong  is a 85 y.o. male with a known history of acute on chronic kidney disease stage III, type II diabetes, diastolic congestive heart failure, hypertension, right lung mass with possible meds to the liver status post liver biopsy results pending comes back to the emergency room from home after he went on 18 September 2018. Patient complains of increasing shortness of breath and leg pain with blister forming up. He was hypoxic in the 80s. He is not on home oxygen. He is requiring here about 2 to 3 L to keep her stats up. He was found to be an acute on chronic congestive heart failure with pedal edema and blisters.  Patient received Lasix in the emergency room he is already put out more than 2 L.  He is being admitted for further evaluation management.  CT chest was done shows possible new infiltrate around the right lower lobe looks like post obstructive pneumonia. He received broad-spectrum antibiotics and the emergency room.  PAST MEDICAL HISTORY:   Past Medical History:  Diagnosis Date   Chronic kidney disease    Diabetes type 2, controlled ([place])    Hypertension     PAST SURGICAL HISTOIRY:   Past Surgical History:  Procedure Laterality Date   PACEMAKER IMPLANT      SOCIAL HISTORY:   Social History   Tobacco Use   Smoking status: Never Smoker   Smokeless tobacco: Never Used  Substance Use Topics   Alcohol use: Never    Frequency: Never    FAMILY HISTORY:   Family History  Problem Relation Age of Onset   Mesothelioma Father     DRUG ALLERGIES:  No Known  Allergies  REVIEW OF SYSTEMS:  Review of Systems  Constitutional: Positive for malaise/fatigue. Negative for chills, diaphoresis, fever and weight loss.  HENT: Negative for congestion, ear discharge, ear pain, hearing loss, nosebleeds and sore throat.   Eyes: Negative for blurred vision, double vision, photophobia, pain and discharge.  Respiratory: Positive for shortness of breath. Negative for hemoptysis, sputum production, wheezing and stridor.   Cardiovascular: Positive for orthopnea and leg swelling. Negative for chest pain, palpitations, claudication and PND.  Gastrointestinal: Negative for abdominal pain, diarrhea, heartburn, nausea and vomiting.  Genitourinary: Negative for dysuria, frequency and urgency.  Musculoskeletal: Positive for joint pain. Negative for back pain and neck pain.  Skin: Negative for rash.  Neurological: Positive for weakness. Negative for tingling, sensory change, speech change, focal weakness, seizures and headaches.  Endo/Heme/Allergies: Does not bruise/bleed easily.  Psychiatric/Behavioral: Negative for depression, hallucinations, memory loss, substance abuse and suicidal ideas. The patient is not nervous/anxious.      MEDICATIONS AT HOME:   Prior to Admission medications   Medication Sig Start Date End Date Taking? Authorizing Provider  allopurinol (ZYLOPRIM) 100 MG tablet Take 100 mg by mouth daily. 07/10/18  Yes [provider]  aspirin EC 81 MG tablet Take 81 mg by mouth daily.   Yes [provider]  cyanocobalamin (,VITAMIN B-12,) 1000 MCG/ML injection Inject 1,000 mcg into the muscle every 30 (thirty) days. 03/24/18  Yes [provider]  furosemide (LASIX) 40 MG tablet Take 40 mg by mouth daily. 02/20/18  Yes [provider]  insulin lispro protamine-lispro (HUMALOG 75/25 MIX) (75-25) 100 UNIT/ML SUSP injection Inject 10 Units into the skin daily with breakfast. 02/20/18  Yes [provider]  Ipratropium-Albuterol  (COMBIVENT RESPIMAT) 20-100 MCG/ACT AERS respimat Inhale 1 puff into the lungs 3 (three) times daily. 09/19/18  Yes Sudini, Alveta Heimlich, MD  metFORMIN (GLUCOPHAGE) 1000 MG tablet Take 1,000 mg by mouth 2 (two) times daily with a meal.   Yes [provider]  naproxen sodium (ALEVE) 220 MG tablet Take 220 mg by mouth daily as needed.   Yes [provider]  predniSONE (DELTASONE) 10 MG tablet Take 2 tablets (20 mg total) by mouth daily. 09/19/18  Yes Sudini, Alveta Heimlich, MD  simvastatin (ZOCOR) 40 MG tablet Take 40 mg by mouth at bedtime. 02/20/18  Yes [provider]  traMADol (ULTRAM) 50 MG tablet Take 50 mg by mouth every 8 (eight) hours as needed for pain. 04/14/18  Yes [provider]      VITAL SIGNS:  Blood pressure (!) 141/77, pulse 82, temperature 98.2 F (36.8 C), temperature source Oral, resp. rate (!) 21, height 5\' 11"  (1.803 m), weight 110.7 kg, SpO2 97 %.  PHYSICAL EXAMINATION:  GENERAL:  85 y.o.-year-old patient lying in the bed with acute distress. Appears chronically ill EYES: Pupils equal, round, reactive to light and accommodation. No scleral icterus. Extraocular muscles intact.  HEENT: Head atraumatic, normocephalic. Oropharynx and nasopharynx clear.  NECK:  Supple, no jugular venous distention. No thyroid enlargement, no tenderness.  LUNGS: decreased breath sounds bilaterally, no wheezing, rales,rhonchi or crepitation. No use of accessory muscles of respiration.  CARDIOVASCULAR: S1, S2 normal. No murmurs, rubs, or gallops. Tachycardia ABDOMEN: Soft, nontender, nondistended. Bowel sounds present. No organomegaly or mass.  EXTREMITIES: bilateral lower extremity edema with blister on the tibial shin      NEUROLOGIC: Cranial nerves II through XII are intact. Muscle strength 5/5 in all extremities. Sensation intact. Gait not checked.  PSYCHIATRIC: The patient is alert and oriented x 2.  SKIN: No obvious rash, lesion, or ulcer.   LABORATORY PANEL:    CBC Recent Labs  Lab 09/21/18 0938  WBC 10.6*  HGB 13.7  HCT 43.3  PLT 181   ------------------------------------------------------------------------------------------------------------------  Chemistries  Recent Labs  Lab 09/21/18 0938  NA 137  K 3.9  CL 95*  CO2 31  GLUCOSE 129*  BUN 41*  CREATININE 1.87*  CALCIUM 8.5*   ------------------------------------------------------------------------------------------------------------------  Cardiac Enzymes No results for input(s): TROPONINI in the last 168 hours. ------------------------------------------------------------------------------------------------------------------  RADIOLOGY:  Ct Angio Chest Pe W And/or Wo Contrast  Result Date: 09/21/2018 CLINICAL DATA:  Shortness of breath.  Lower extremity edema EXAM: CT ANGIOGRAPHY CHEST WITH CONTRAST TECHNIQUE: Multidetector CT imaging of the chest was performed using the standard protocol during bolus administration of intravenous contrast. Multiplanar CT image reconstructions and MIPs were obtained to evaluate the vascular anatomy. CONTRAST:  50mL OMNIPAQUE IOHEXOL 350 MG/ML SOLN COMPARISON:  Chest CT September 11, 2018 FINDINGS: Cardiovascular: There is no demonstrable pulmonary embolus. There is no evident thoracic aortic aneurysm or dissectionww. The visualized great vessels appear unremarkable. There is aortic atherosclerosis. There are foci of coronary artery calcification. There is a moderate pericardial effusion. Pacemaker leads are attached to the right atrium and right ventricle. Mediastinum/Nodes: Thyroid appears unremarkable. There is a mass which extends from the subcarinal region to the level of the right hemidiaphragm. This large  mass measures 12.4 x 7.1 x 5.7 cm. It is difficult to assess whether this mass arises from the mediastinum versus lung parenchyma. There may be subcarinal adenopathy indistinguishable from adjacent mass. Note that this mass extends through the  azygoesophageal recess, similar to recent CT. No other findings suggesting adenopathy. The subcarinal region mass impresses upon the esophagus but does not convincingly invade the esophagus. There is moderate fluid and air in the esophagus which may be secondary to compression or possibly a degree of esophageal invasion distally. Lungs/Pleura: Moderate free-flowing pleural effusions are evident. There is consolidation in the right lower lobe felt to represent pneumonitis, separate from the questionable mass arising in the right lower lobe and extending into the subcarinal region. A nodular lesion in the lateral segment of the right lower lobe is again noted, measuring 2.0 x 1.4 cm, a potential metastasis. Upper Abdomen: There are multiple liver masses, likely due to metastatic foci. Liver contour is indicative of cirrhosis. Visualized upper abdominal structures otherwise appear unremarkable. Musculoskeletal: There is extensive thoracic arthropathy with diffuse idiopathic skeletal hyperostosis. No blastic or lytic bone lesions are evident. No chest wall lesions are evident. There is advanced arthropathy in each shoulder. Note that pacemaker is present on the left anteriorly. Review of the MIP images confirms the above findings. IMPRESSION: 1. No demonstrable pulmonary embolus. No thoracic aortic aneurysm or dissection. There is aortic atherosclerosis. There are foci of coronary artery calcification. 2. The large mass involving both right upper lobe and subcarinal region again noted. It is difficult to ascertain whether this mass arises from the subcarinal mediastinum and extends in the right lung versus extending from the right lung into the mediastinum. This mass impresses upon and may invade the esophagus. It measures 12.4 x 7.1 x 5.7 cm. 3. Probable metastasis in the right middle lobe measuring 2.0 x 1.4 cm. 4. Multiple liver metastases evident. There is underlying hepatic cirrhosis. 5.  Moderate pericardial  effusion. 6. Bilateral pleural effusions with areas of consolidation felt to represent pneumonia in the right lower lobe. 7.  Pacemaker leads attached to right atrium and right ventricle. Aortic Atherosclerosis (ICD10-I70.0). Electronically Signed   By: Lowella Grip III M.D.   On: 09/21/2018 11:26   Dg Chest Portable 1 View  Result Date: 09/21/2018 CLINICAL DATA:  Shortness of breath EXAM: PORTABLE CHEST 1 VIEW COMPARISON:  September 15, 2018 FINDINGS: There are small pleural effusions bilaterally with bibasilar atelectasis. There is no frank edema or consolidation. Heart is mildly enlarged with pulmonary vascularity normal. Pacemaker leads are attached to the right atrium and right ventricle. No adenopathy. There is arthropathy in each shoulder. IMPRESSION: Mild cardiomegaly. Pacemaker leads attached to right atrium and right ventricle. Small pleural effusions with bibasilar atelectasis. No edema or consolidation evident. Electronically Signed   By: Lowella Grip III M.D.   On: 09/21/2018 10:10    EKG:    IMPRESSION AND PLAN:   Javier Wong  is a 85 y.o. male with a known history of acute on chronic kidney disease stage III, type II diabetes, diastolic congestive heart failure, hypertension, right lung mass with possible meds to the liver status post liver biopsy results pending comes back to the emergency room from home after he went on 18 September 2018.  1. Acute hypoxic respiratory failure now on oxygen requiring about 2 to 3 L nasal cannula to keep startup which is new for the patient suspected due to acute on chronic diastolic congestive heart failure, large right lower lobe mass  with post obstructive pneumonia and COPD -admit to medical floor -IV Lasix -monitor input output, creatinine -seen by Dr. Juleen China -continue cardiac meds  2. Left lower extremity edema with blister -wound consult if needed -apply foam dressing  3. Post obstructive pneumonia -IV Rocephin and Zithromax  4.  Right-sided lung mass with meds to the liver status post liver biopsy results pending. -Dr. Rogue Bussing will see the patient  5. History of atrial flutter-- noted during last admission -patient has pacemaker -not on any blood thinners since patient was undergoing biopsy  6. Diabetes type II continue sliding scale and home insulin   All the records are reviewed and case discussed with ED provider.   CODE STATUS: DNR  TOTAL TIME TAKING CARE OF THIS PATIENT: *55* minutes.    Fritzi Mandes M.D on 09/21/2018 at 4:10 PM  Between 7am to 6pm - Pager - [phone]  After 6pm go to [email or web address] - password [email or web address] [facility]  [facility]  Office  [phone]  CC: Primary care physician; Glendon Axe, MD

## 2018-09-21 NOTE — ED Notes (Signed)
Informed RN that 2nd dose of Lasix was not given yet.

## 2018-09-21 NOTE — ED Triage Notes (Signed)
Pt arrives via ems from home with complaints of shortness of breath and swelling that has increased over the last few days. EMS reports a room air saturation (pt's baseline) in the 80s, in triage pt 100% on 3L of supplemental o2.

## 2018-09-21 NOTE — Progress Notes (Signed)
Patient arrived to unit at this time. Pt is alert and oriented but very anxious. Pt started crying when RN was trying to put on DNR bracelet. VSS on 3L [place]. Pt is tachypnic.

## 2018-09-21 NOTE — Care Management (Signed)
Patient is followed by [employer]  home health PT, OT, Nursing, Aide, and social worker. Cheryl updated.

## 2018-09-21 NOTE — ED Notes (Signed)
Called pt's daughter April and informed her of pt's room number and plan of care.

## 2018-09-21 NOTE — Progress Notes (Signed)
[facility]  ROUNDING NOTE   Subjective:   Discharged from [facility] from 7/23 to 7/31. Readmitted today with shortness of breath   Found to have peripheral edema weeping. Foley catheter placed with greater than 2 liters of urine output.   Objective:  Vital signs in last 24 hours:  Temp:  [98.2 F (36.8 C)] 98.2 F (36.8 C) (08/03 0931) Pulse Rate:  [68-82] 82 (08/03 1415) Resp:  [14-26] 21 (08/03 1430) BP: (120-141)/(42-84) 141/77 (08/03 1430) SpO2:  [97 %-100 %] 97 % (08/03 1415) Weight:  [110.7 kg] 110.7 kg (08/03 0931)  Weight change:  Filed Weights   09/21/18 0931  Weight: 110.7 kg    Intake/Output: No intake/output data recorded.   Intake/Output this shift:  No intake/output data recorded.  Physical Exam: General: NAD, laying in bed  Head: Normocephalic, atraumatic. Moist oral mucosal membranes  Eyes: Anicteric, PERRL  Neck: Supple, trachea midline  Lungs:  clear  Heart: Regular rate and rhythm  Abdomen:  Soft, nontender,   Extremities:  ++++peripheral edema. +weeping  Neurologic: Nonfocal, moving all four extremities  Skin: No lesions        Basic Metabolic Panel: Recent Labs  Lab 09/15/18 0309 09/17/18 0639 09/21/18 0938  NA 138 139 137  K 3.7 3.9 3.9  CL 97* 99 95*  CO2 _0 GLUCOSE 160* 159* 129*  BUN 38* 39* 41*  CREATININE 1.64* 1.70* 1.87*  CALCIUM 8.9 8.9 8.5*  PHOS  --  2.8  --     Liver Function Tests: Recent Labs  Lab 09/17/18 0639  ALBUMIN 3.4*   No results for input(s): LIPASE, AMYLASE in the last 168 hours. No results for input(s): AMMONIA in the last 168 hours.  CBC: Recent Labs  Lab 09/17/18 0639 09/21/18 0938  WBC 19.1* 10.6*  NEUTROABS  --  8.8*  HGB 14.0 13.7  HCT 44.4 43.3  MCV 93.9 95.8  PLT 261 181    Cardiac Enzymes: No results for input(s): CKTOTAL, CKMB, CKMBINDEX, TROPONINI in the last 168 hours.  BNP: Invalid input(s): POCBNP  CBG: Recent Labs  Lab 09/18/18 1239 09/18/18 1650  09/18/18 2053 09/19/18 0748 09/19/18 1137  GLUCAP 159* 189* 152* 113* 221*    Microbiology: Results for orders placed or performed during the hospital encounter of 09/21/18  SARS Coronavirus 2 [facility] order, Performed in [facility] hospital lab) Nasopharyngeal Nasopharyngeal Swab     Status: None   Collection Time: 09/21/18 11:40 AM   Specimen: Nasopharyngeal Swab  Result Value Ref Range Status   SARS Coronavirus 2 NEGATIVE NEGATIVE Final    Comment: (NOTE) If result is NEGATIVE SARS-CoV-2 target nucleic acids are NOT DETECTED. The SARS-CoV-2 RNA is generally detectable in upper and lower  respiratory specimens during the acute phase of infection. The lowest  concentration of SARS-CoV-2 viral copies this assay can detect is 250  copies / mL. A negative result does not preclude SARS-CoV-2 infection  and should not be used as the sole basis for treatment or other  patient management decisions.  A negative result may occur with  improper specimen collection / handling, submission of specimen other  than nasopharyngeal swab, presence of viral mutation(s) within the  areas targeted by this assay, and inadequate number of viral copies  (<250 copies / mL). A negative result must be combined with clinical  observations, patient history, and epidemiological information. If result is POSITIVE SARS-CoV-2 target nucleic acids are DETECTED. The SARS-CoV-2 RNA is generally detectable in upper  and lower  respiratory specimens dur ing the acute phase of infection.  Positive  results are indicative of active infection with SARS-CoV-2.  Clinical  correlation with patient history and other diagnostic information is  necessary to determine patient infection status.  Positive results do  not rule out bacterial infection or co-infection with other viruses. If result is PRESUMPTIVE POSTIVE SARS-CoV-2 nucleic acids MAY BE PRESENT.   A presumptive positive result was obtained on the submitted  specimen  and confirmed on repeat testing.  While 2019 novel coronavirus  (SARS-CoV-2) nucleic acids may be present in the submitted sample  additional confirmatory testing may be necessary for epidemiological  and / or clinical management purposes  to differentiate between  SARS-CoV-2 and other Sarbecovirus currently known to infect humans.  If clinically indicated additional testing with an alternate test  methodology [phone]) is advised. The SARS-CoV-2 RNA is generally  detectable in upper and lower respiratory sp ecimens during the acute  phase of infection. The expected result is Negative. Fact Sheet for Patients:  StrictlyIdeas.no Fact Sheet for Healthcare Providers: BankingDealers.co.za This test is not yet approved or cleared by the [place] FDA and has been authorized for detection and/or diagnosis of SARS-CoV-2 by FDA under an Emergency Use Authorization (EUA).  This EUA will remain in effect (meaning this test can be used) for the duration of the COVID-19 declaration under Section 564(b)(1) of the Act, 21 U.S.C. section 360bbb-3(b)(1), unless the authorization is terminated or revoked sooner. Performed at [facility], [address] [place], [place] [address]     Coagulation Studies: No results for input(s): LABPROT, INR in the last 72 hours.  Urinalysis: No results for input(s): COLORURINE, LABSPEC, PHURINE, GLUCOSEU, HGBUR, BILIRUBINUR, KETONESUR, PROTEINUR, UROBILINOGEN, NITRITE, LEUKOCYTESUR in the last 72 hours.  Invalid input(s): APPERANCEUR    Imaging: Ct Angio Chest Pe W And/or Wo Contrast  Result Date: 09/21/2018 CLINICAL DATA:  Shortness of breath.  Lower extremity edema EXAM: CT ANGIOGRAPHY CHEST WITH CONTRAST TECHNIQUE: Multidetector CT imaging of the chest was performed using the standard protocol during bolus administration of intravenous contrast. Multiplanar CT image reconstructions and  MIPs were obtained to evaluate the vascular anatomy. CONTRAST:  64m OMNIPAQUE IOHEXOL 350 MG/ML SOLN COMPARISON:  Chest CT September 11, 2018 FINDINGS: Cardiovascular: There is no demonstrable pulmonary embolus. There is no evident thoracic aortic aneurysm or dissectionww. The visualized great vessels appear unremarkable. There is aortic atherosclerosis. There are foci of coronary artery calcification. There is a moderate pericardial effusion. Pacemaker leads are attached to the right atrium and right ventricle. Mediastinum/Nodes: Thyroid appears unremarkable. There is a mass which extends from the subcarinal region to the level of the right hemidiaphragm. This large mass measures 12.4 x 7.1 x 5.7 cm. It is difficult to assess whether this mass arises from the mediastinum versus lung parenchyma. There may be subcarinal adenopathy indistinguishable from adjacent mass. Note that this mass extends through the azygoesophageal recess, similar to recent CT. No other findings suggesting adenopathy. The subcarinal region mass impresses upon the esophagus but does not convincingly invade the esophagus. There is moderate fluid and air in the esophagus which may be secondary to compression or possibly a degree of esophageal invasion distally. Lungs/Pleura: Moderate free-flowing pleural effusions are evident. There is consolidation in the right lower lobe felt to represent pneumonitis, separate from the questionable mass arising in the right lower lobe and extending into the subcarinal region. A nodular lesion in the lateral segment of the right lower lobe  is again noted, measuring 2.0 x 1.4 cm, a potential metastasis. Upper Abdomen: There are multiple liver masses, likely due to metastatic foci. Liver contour is indicative of cirrhosis. Visualized upper abdominal structures otherwise appear unremarkable. Musculoskeletal: There is extensive thoracic arthropathy with diffuse idiopathic skeletal hyperostosis. No blastic or lytic bone  lesions are evident. No chest wall lesions are evident. There is advanced arthropathy in each shoulder. Note that pacemaker is present on the left anteriorly. Review of the MIP images confirms the above findings. IMPRESSION: 1. No demonstrable pulmonary embolus. No thoracic aortic aneurysm or dissection. There is aortic atherosclerosis. There are foci of coronary artery calcification. 2. The large mass involving both right upper lobe and subcarinal region again noted. It is difficult to ascertain whether this mass arises from the subcarinal mediastinum and extends in the right lung versus extending from the right lung into the mediastinum. This mass impresses upon and may invade the esophagus. It measures 12.4 x 7.1 x 5.7 cm. 3. Probable metastasis in the right middle lobe measuring 2.0 x 1.4 cm. 4. Multiple liver metastases evident. There is underlying hepatic cirrhosis. 5.  Moderate pericardial effusion. 6. Bilateral pleural effusions with areas of consolidation felt to represent pneumonia in the right lower lobe. 7.  Pacemaker leads attached to right atrium and right ventricle. Aortic Atherosclerosis (ICD10-I70.0). Electronically Signed   By: Lowella Grip III M.D.   On: 09/21/2018 11:26   Dg Chest Portable 1 View  Result Date: 09/21/2018 CLINICAL DATA:  Shortness of breath EXAM: PORTABLE CHEST 1 VIEW COMPARISON:  September 15, 2018 FINDINGS: There are small pleural effusions bilaterally with bibasilar atelectasis. There is no frank edema or consolidation. Heart is mildly enlarged with pulmonary vascularity normal. Pacemaker leads are attached to the right atrium and right ventricle. No adenopathy. There is arthropathy in each shoulder. IMPRESSION: Mild cardiomegaly. Pacemaker leads attached to right atrium and right ventricle. Small pleural effusions with bibasilar atelectasis. No edema or consolidation evident. Electronically Signed   By: Lowella Grip III M.D.   On: 09/21/2018 10:10     Medications:    . cefTRIAXone (ROCEPHIN)  IV     . [START ON 09/22/2018] azithromycin  500 mg Oral Daily  . furosemide  40 mg Intravenous Q12H     Assessment/ Plan:  Mr. Javier Wong is a 85 y.o. white male withhypertension, hyperlipidemia, history of complete heart block status post dual chamber pacemaker placement, diabetes mellitus type II, gout, who was admitted to [facility] on7/23/2020. Found to have right lung mass.   1. Acute renal failure with hyperkalemia and metabolic acidosis: on chronic kidney disease stage III with proteinuria:  baseline creatinine 1.5, EGFR 45 on 05/20/2018. Chronic kidney disease secondary to NSAIDs and diabetes Acute renal failure could be secondary to obstructive uropathy versus acute cardiorenal syndrome.    2. Hypertension: and acute exacerbation of congestive heart failure - IV furosemide   LOS: 0 Livie Vanderhoof 8/3/20203:59 PM

## 2018-09-21 NOTE — ED Provider Notes (Signed)
[facility] Emergency Department Provider Note  ____________________________________________   First MD Initiated Contact with Patient 09/21/18 0930     (approximate)  I have reviewed the triage vital signs and the nursing notes.   HISTORY  Chief Complaint Shortness of Breath   HPI Javier BOZZI is a 85 y.o. male with history of hypertension, diabetes, complete heart block status post pacemaker, diastolic CHF presents to the ED complaining of shortness of breath.  Patient reports he has had gradually worsening shortness of breath since last night.  He has noticed that both legs are becoming increasingly swollen and he easily becomes out of breath.  He describes a mild cough but denies any fevers or chest pain.  He has not had any known sick contacts.  He was recently discharged from hospital following admission for weakness and discovery of right lower lobe mass, underwent liver biopsy after suspected mets discovered in liver and femur.       Past Medical History:  Diagnosis Date  . Chronic kidney disease   . Diabetes type 2, controlled ([place])   . Hypertension     Patient Active Problem List   Diagnosis Date Noted  . Acute respiratory failure with hypoxemia ([place]) 09/21/2018  . Mass of lower lobe of right lung 09/13/2018  . Pericardial effusion   . AKI (acute kidney injury) ([place]) 09/10/2018    Past Surgical History:  Procedure Laterality Date  . PACEMAKER IMPLANT      Prior to Admission medications   Medication Sig Start Date End Date Taking? Authorizing Provider  allopurinol (ZYLOPRIM) 100 MG tablet Take 100 mg by mouth daily. 07/10/18  Yes [provider]  aspirin EC 81 MG tablet Take 81 mg by mouth daily.   Yes [provider]  cyanocobalamin (,VITAMIN B-12,) 1000 MCG/ML injection Inject 1,000 mcg into the muscle every 30 (thirty) days. 03/24/18  Yes [provider]  furosemide (LASIX) 40 MG tablet Take 40 mg by  mouth daily. 02/20/18  Yes [provider]  insulin lispro protamine-lispro (HUMALOG 75/25 MIX) (75-25) 100 UNIT/ML SUSP injection Inject 10 Units into the skin daily with breakfast. 02/20/18  Yes [provider]  Ipratropium-Albuterol (COMBIVENT RESPIMAT) 20-100 MCG/ACT AERS respimat Inhale 1 puff into the lungs 3 (three) times daily. 09/19/18  Yes Sudini, Alveta Heimlich, MD  metFORMIN (GLUCOPHAGE) 1000 MG tablet Take 1,000 mg by mouth 2 (two) times daily with a meal.   Yes [provider]  naproxen sodium (ALEVE) 220 MG tablet Take 220 mg by mouth daily as needed.   Yes [provider]  predniSONE (DELTASONE) 10 MG tablet Take 2 tablets (20 mg total) by mouth daily. 09/19/18  Yes Sudini, Alveta Heimlich, MD  simvastatin (ZOCOR) 40 MG tablet Take 40 mg by mouth at bedtime. 02/20/18  Yes [provider]  traMADol (ULTRAM) 50 MG tablet Take 50 mg by mouth every 8 (eight) hours as needed for pain. 04/14/18  Yes [provider]    Allergies Patient has no known allergies.  Family History  Problem Relation Age of Onset  . Mesothelioma Father     Social History Social History   Tobacco Use  . Smoking status: Never Smoker  . Smokeless tobacco: Never Used  Substance Use Topics  . Alcohol use: Never    Frequency: Never  . Drug use: Never    Review of Systems Constitutional: No fever/chills Eyes: No visual changes. ENT: No sore throat. Cardiovascular: Denies chest pain. Lower extremity edema. Respiratory:  Shortness of breath. Gastrointestinal: No abdominal pain.  No nausea, no vomiting.  No diarrhea.  No constipation. Genitourinary: Negative for dysuria. Musculoskeletal: Negative for neck pain.  Negative for back pain. Integumentary: Negative for rash. Neurological: Negative for headaches, focal weakness or numbness.  ____________________________________________   PHYSICAL EXAM:  VITAL SIGNS: ED Triage Vitals [09/21/18 0931]  Enc Vitals Group     BP       Pulse Rate 68     Resp (!) 26     Temp 98.2 F (36.8 C)     Temp Source Oral     SpO2 100 %     Weight 244 lb (110.7 kg)     Height 5\' 11"  (1.803 m)     Head Circumference      Peak Flow      Pain Score 8     Pain Loc      Pain Edu?      Excl. in [place]?    Constitutional: Alert and oriented. Well appearing and in no acute distress. Eyes: Conjunctivae are normal.  Head: Atraumatic. Nose: No congestion/rhinnorhea. Mouth/Throat: Mucous membranes are moist. Neck: No stridor.  No meningeal signs.   Cardiovascular: Normal rate, regular rhythm. Good peripheral circulation. Grossly normal heart sounds. Respiratory: Normal respiratory effort.  No retractions. No audible wheezing. Gastrointestinal: Soft and nontender. No distention. Biopsy site to RUQ clean, dry, and intact. Musculoskeletal: 2+ Pitting edema to bilateral lower extremities. No gross deformities of extremities. Neurologic:  Normal speech and language. No gross focal neurologic deficits are appreciated.  Skin:  Skin is warm, dry and intact. No rash noted.  ____________________________________________   LABS (all labs ordered are listed, but only abnormal results are displayed)  Labs Reviewed  CBC WITH DIFFERENTIAL/PLATELET - Abnormal; Notable for the following components:      Result Value   WBC 10.6 (*)    Neutro Abs 8.8 (*)    Abs Immature Granulocytes 0.13 (*)    All other components within normal limits  BASIC METABOLIC PANEL - Abnormal; Notable for the following components:   Chloride 95 (*)    Glucose, Bld 129 (*)    BUN 41 (*)    Creatinine, Ser 1.87 (*)    Calcium 8.5 (*)    GFR calc non Af Amer 32 (*)    GFR calc Af Amer 37 (*)    All other components within normal limits  TROPONIN I (HIGH SENSITIVITY) - Abnormal; Notable for the following components:   Troponin I (High Sensitivity) 28 (*)    All other components within normal limits  TROPONIN I (HIGH SENSITIVITY) - Abnormal; Notable for the  following components:   Troponin I (High Sensitivity) 29 (*)    All other components within normal limits  SARS CORONAVIRUS 2 (HOSPITAL ORDER, [place] [facility])  BRAIN NATRIURETIC PEPTIDE     ____________________________________________   PROCEDURES   Procedure(s) performed (including Critical Care):  Procedures   ____________________________________________   INITIAL IMPRESSION / MDM / ASSESSMENT AND PLAN / ED COURSE       85 year old male presenting to the ED for worsening shortness of breath following recent admission for CHF exacerbation and biopsy of metastatic liver mass thought to originate from right lower lobe.  He has new oxygen requirement upon arrival, maintaining sats on 3 L nasal cannula.  Given his suspected malignancy, CTA performed to rule out PE and is negative for this.  Does show evidence of postobstructive pneumonia as well as CHF  exacerbation.  Patient treated with broad-spectrum antibiotics given his recent admission, vitals not consistent with sepsis.  Lasix treatment also initiated.  Case discussed with hospitalist, who accepts patient for admission.      ____________________________________________  FINAL CLINICAL IMPRESSION(S) / ED DIAGNOSES  Final diagnoses:  Post-obstructive pneumonia due to foreign body aspiration  Acute on chronic systolic congestive heart failure (HCC)  Right lower lobe lung mass     MEDICATIONS GIVEN DURING THIS VISIT:  Medications  furosemide (LASIX) injection 40 mg (has no administration in time range)  cefTRIAXone (ROCEPHIN) 2 g in sodium chloride 0.9 % 100 mL IVPB (has no administration in time range)  azithromycin (ZITHROMAX) tablet 500 mg (has no administration in time range)  iohexol (OMNIPAQUE) 350 MG/ML injection 60 mL (60 mLs Intravenous Contrast Given 09/21/18 1105)  morphine 4 MG/ML injection 4 mg (4 mg Intravenous Given 09/21/18 1139)  piperacillin-tazobactam (ZOSYN) IVPB 3.375 g (0 g  Intravenous Stopped 09/21/18 1231)  azithromycin (ZITHROMAX) 500 mg in sodium chloride 0.9 % 250 mL IVPB (0 mg Intravenous Stopped 09/21/18 1332)  furosemide (LASIX) injection 40 mg (40 mg Intravenous Given 09/21/18 1150)     ED Discharge Orders    None      *Please note:  WILFRED DAYRIT was evaluated in Emergency Department on 09/21/2018 for the symptoms described in the history of present illness. He was evaluated in the context of the global COVID-19 pandemic, which necessitated consideration that the patient might be at risk for infection with the SARS-CoV-2 virus that causes COVID-19. Institutional protocols and algorithms that pertain to the evaluation of patients at risk for COVID-19 are in a state of rapid change based on information released by regulatory bodies including the CDC and federal and state organizations. These policies and algorithms were followed during the patient's care in the ED.  Some ED evaluations and interventions may be delayed as a result of limited staffing during the pandemic.*  Note:  This document was prepared using Dragon voice recognition software and may include unintentional dictation errors.    Blake Divine, MD 09/21/18 1705

## 2018-09-21 NOTE — ED Notes (Signed)
Spoke with pt's daughter April and updated her on pt status at this time

## 2018-09-21 NOTE — Assessment & Plan Note (Addendum)
85 year old male patient with multiple medical problems including a right lower lobe mass/bulky mediastinal adenopathy/multiple liver lesions; CKD is currently admitted to hospital for worsening shortness of breath/worsening swelling in the legs  #Right lower lobe lung mass/bulky mediastinal adenopathy/pericardial effusion/liver lesions-highly suspicious for malignant process.  Liver biopsy [done on July 31st]-preliminary high-grade carcinoma-immunohistochemistry suggestive of multiple cancer.  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  Also discussed with pathology.  See discussion below  #Worsening respiratory status-multifactorial fluid overload/right lower lobe mass/adenopathy/question postobstructive pneumonia-/malignancy worsening.  #Chronic kidney disease with a recent episode of acute renal failure; currently creatinine is 2.5 worsening.  #Long discussion with the patient and son [name] the room]; the pathology/aggressive lung malignancy-and in general median life would be in order of 10 to 12 months with aggressive chemotherapy.  However, chemotherapy would be prohibitive with patient's multiple comorbidities/renal function.  He would be at high risk of death from the treatment itself.  Without chemotherapy-the life expectancy would be in order of few weeks.  Patient wants to go home with hospice.  Discussed with the patient's son at length.  Also discussed with palliative care/hospitalist Dr. Manuella Ghazi.   # 40 minutes face-to-face with the patient/pt's son discussing the above plan of care; more than 50% of time spent on prognosis/ natural history; counseling and coordination.

## 2018-09-22 ENCOUNTER — Telehealth: Payer: Self-pay | Admitting: Internal Medicine

## 2018-09-22 DIAGNOSIS — Z7189 Other specified counseling: Secondary | ICD-10-CM

## 2018-09-22 DIAGNOSIS — Z515 Encounter for palliative care: Secondary | ICD-10-CM

## 2018-09-22 LAB — GLUCOSE, CAPILLARY
Glucose-Capillary: 107 mg/dL — ABNORMAL HIGH (ref 70–99)
Glucose-Capillary: 142 mg/dL — ABNORMAL HIGH (ref 70–99)
Glucose-Capillary: 161 mg/dL — ABNORMAL HIGH (ref 70–99)
Glucose-Capillary: 136 mg/dL — ABNORMAL HIGH (ref 70–99)

## 2018-09-22 LAB — BASIC METABOLIC PANEL
Anion gap: 12 (ref 5–15)
BUN: 36 mg/dL — ABNORMAL HIGH (ref 8–23)
CO2: 34 mmol/L — ABNORMAL HIGH (ref 22–32)
Calcium: 8 mg/dL — ABNORMAL LOW (ref 8.9–10.3)
Chloride: 93 mmol/L — ABNORMAL LOW (ref 98–111)
Creatinine, Ser: 1.94 mg/dL — ABNORMAL HIGH (ref 0.61–1.24)
GFR calc Af Amer: 36 mL/min — ABNORMAL LOW (ref >60)
GFR calc non Af Amer: 31 mL/min — ABNORMAL LOW (ref >60)
Glucose, Bld: 114 mg/dL — ABNORMAL HIGH (ref 70–99)
Potassium: 3.3 mmol/L — ABNORMAL LOW (ref 3.5–5.1)
Sodium: 139 mmol/L (ref 135–145)

## 2018-09-22 LAB — SURGICAL PATHOLOGY

## 2018-09-22 MED ORDER — POTASSIUM CHLORIDE CRYS ER 20 MEQ PO TBCR
40.0000 meq | EXTENDED_RELEASE_TABLET | Freq: Once | ORAL | Status: AC
  Administered 2018-09-22: 11:00:00 40 meq via ORAL
  Filled 2018-09-22: qty 2

## 2018-09-22 NOTE — Telephone Encounter (Signed)
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  Patient lifelong non-smoker.  Spoke to patient's son-results of the biopsy/in general chemotherapy is a treatment option.  However given patient comorbidities/performance status not a candidate for chemotherapy.  Discussed regarding hospice.  Interested in hospice at patient's home.  Son states that he will try to have social support for the patient at home.  Discussed the life expectancy in order of few weeks.

## 2018-09-22 NOTE — Progress Notes (Signed)
[place] [facility] [place] NAME: Javier Wong    MR#:  [id number]  DATE OF BIRTH:  1933/01/22  SUBJECTIVE:  CHIEF COMPLAINT:   Chief Complaint  Patient presents with  . Shortness of Breath  Has weeping lower extremity edema, laying in bed without any new complaints, tired REVIEW OF SYSTEMS:  Review of Systems  Constitutional: Positive for malaise/fatigue. Negative for diaphoresis, fever and weight loss.  HENT: Negative for ear discharge, ear pain, hearing loss, nosebleeds, sore throat and tinnitus.   Eyes: Negative for blurred vision and pain.  Respiratory: Negative for cough, hemoptysis, shortness of breath and wheezing.   Cardiovascular: Negative for chest pain, palpitations, orthopnea and leg swelling.  Gastrointestinal: Negative for abdominal pain, blood in stool, constipation, diarrhea, heartburn, nausea and vomiting.  Genitourinary: Negative for dysuria, frequency and urgency.  Musculoskeletal: Negative for back pain and myalgias.  Skin: Negative for itching and rash.  Neurological: Negative for dizziness, tingling, tremors, focal weakness, seizures, weakness and headaches.  Psychiatric/Behavioral: Negative for depression. The patient is not nervous/anxious.     DRUG ALLERGIES:  No Known Allergies VITALS:  Blood pressure (!) 109/47, pulse (!) 59, temperature 98.4 F (36.9 C), temperature source Oral, resp. rate 19, height 5\' 11"  (1.803 m), weight 110.4 kg, SpO2 93 %. PHYSICAL EXAMINATION:  Physical Exam HENT:     Head: Normocephalic and atraumatic.  Eyes:     Conjunctiva/sclera: Conjunctivae normal.     Pupils: Pupils are equal, round, and reactive to light.  Neck:     Musculoskeletal: Normal range of motion and neck supple.     Thyroid: No thyromegaly.     Trachea: No tracheal deviation.  Cardiovascular:     Rate and Rhythm: Normal rate and regular rhythm.     Heart sounds: Normal heart sounds.  Pulmonary:   Effort: Pulmonary effort is normal. No respiratory distress.     Breath sounds: Normal breath sounds. No wheezing.  Chest:     Chest wall: No tenderness.  Abdominal:     General: Bowel sounds are normal. There is no distension.     Palpations: Abdomen is soft.     Tenderness: There is no abdominal tenderness.  Musculoskeletal: Normal range of motion.        General: Swelling present.  Skin:    General: Skin is warm and dry.     Findings: No rash.     Comments: Is a healing blister which is ruptured left lower extremity  Neurological:     Mental Status: He is alert and oriented to person, place, and time.     Cranial Nerves: No cranial nerve deficit.    LABORATORY PANEL:  Male CBC Recent Labs  Lab 09/21/18 0938  WBC 10.6*  HGB 13.7  HCT 43.3  PLT 181   ------------------------------------------------------------------------------------------------------------------ Chemistries  Recent Labs  Lab 09/22/18 0522  NA 139  K 3.3*  CL 93*  CO2 34*  GLUCOSE 114*  BUN 36*  CREATININE 1.94*  CALCIUM 8.0*   RADIOLOGY:  No results found. ASSESSMENT AND PLAN:  Javier Wong  is a 85 y.o. male with a known history of acute on chronic kidney disease stage III, type II diabetes, diastolic congestive heart failure, hypertension, right lung mass with possible mets to the liver status post recent liver biopsy admitted to hospital for worsening shortness of breath/worsening swelling in the legs   * Acute hypoxic respiratory failure: multifactorial fluid  overload/right lower lobe mass/adenopathy/question postobstructive pneumonia-stable.  On broad-spectrum antibiotic.  - now on oxygen requiring about 2 to 3 L nasal cannula to keep startup which is new for the patient suspected due to acute on chronic diastolic congestive heart failure, large right lower lobe mass with post obstructive pneumonia and COPD -IV Lasix -monitor input output, creatinine -seen by Dr. Juleen China Acute  hypoxic respiratory failure now on oxygen requiring about 2 to 3 L nasal cannula to keep startup which is new for the patient suspected due to acute on chronic diastolic congestive heart failure, large right lower lobe mass with post obstructive pneumonia and COPD -admit to medical floor -IV Lasix -monitor input output, creatinine -seen by Dr. Juleen China -continue cardiac meds  * Left lower extremity edema with blister -no s/s of infection -apply foam dressing  * Post obstructive pneumonia -IV Rocephin and Zithromax  * Right lower lobe lung mass/bulky mediastinal adenopathy/pericardial effusion/liver lesions-highly suspicious for malignant process.  Liver biopsy [done on July 31st]-preliminary high-grade malignancy. Onco following  *Acute on chronic kidney disease - Creatinine is 1.9; stable.  High risk of worsening renal insufficiency-given recent contrast.  Followed by nephrology.  * History of atrial flutter-- noted during last admission -patient has pacemaker -not on any blood thinners since patient was undergoing biopsy  * Diabetes type II continue sliding scale and home insulin   Overall very poor prognosis.  Patient seem to understand this.  Will await palliative care evaluation.  Hopefully can consider hospice at home.  Patient would like to die at home.   All the records are reviewed and case discussed with Care Management/Social Worker. Management plans discussed with the patient, nursing and they are in agreement.  CODE STATUS: DNR  TOTAL TIME TAKING CARE OF THIS PATIENT: 20 minutes.   More than 50% of the time was spent in counseling/coordination of care: YES  POSSIBLE D/C IN 1-2 DAYS, DEPENDING ON CLINICAL CONDITION.   Max Sane M.D on 09/22/2018 at 4:19 PM  Between 7am to 6pm - Pager - [phone]  After 6pm go to [email or web address] - Proofreader  [facility]  Office  [phone]  CC: Primary care physician; Glendon Axe, MD  Note: This dictation was prepared with Dragon dictation along with smaller phrase technology. Any transcriptional errors that result from this process are unintentional.

## 2018-09-22 NOTE — Progress Notes (Signed)
Ch f/u with pt per request of nurse. Pt has been informed of the status of his cancer. Pt still emotionally distress regarding the cancer and his decline in health. Pt shared that he is frustrated about hearing the suggestion for rehab again. Ch helped pt to understand the reason for the recommendation but also acknowledge that the pt is scared of the risk involved with not being able to see his family and possibly contracting CV-19 which admitted in the rehab. Ch asked guided questions about the pt's hope for returning home. 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 Ch understood that the pt wanted to be in a place of comfort and spend time with his son who is currently here and grandson who will be traveling from [place]. to see him soon. Upon request, ch prayed for pt to be less anxious, for comfort and traveling mercies for his family as they come to be with him.  Goal: f/u with pt's family while they visit him; play gospel music for pt as a means of comforting him; f/u with care team to determine pt's GOC/TOC

## 2018-09-22 NOTE — Evaluation (Signed)
Physical Therapy Evaluation Patient Details Name: Javier Wong MRN: [id number] DOB: 1932/03/22 Today's Date: 09/22/2018   History of Present Illness  From MD H&P:  Pt is an 85 y.o. male with a known history of acute on chronic kidney disease stage III, type II diabetes, diastolic congestive heart failure, hypertension, right lung mass with possible mets to the liver status post liver biopsy results pending comes back to the emergency room from home after he went on 18 September 2018.  Patient complains of increasing shortness of breath and leg pain with blister forming up. He was hypoxic in the 80s.  He is requiring here about 2 to 3 L to keep her stats up. He was found to be an acute on chronic congestive heart failure with pedal edema and blisters. Patient received Lasix in the emergency room he is already put out more than 2 L. CT chest was done shows possible new infiltrate around the right lower lobe looks like post obstructive pneumonia. He received broad-spectrum antibiotics and the emergency room.  Assessment includes: Acute hypoxic respiratory failure, Left lower extremity edema with blister, Post obstructive pneumonia, Right-sided lung mass with mets to the liver, h/o atrial flutter, and DM II.    Clinical Impression  Pt presents with deficits in strength, transfers, mobility, gait, balance, and activity tolerance.  Pt initially declined to participate with PT services but with encouragement agreed to mobility assessment.  Pt required min-mod A with bed mobility tasks and min A with transfers with cues for proper sequencing.  Pt limited during the session by LLE pain and refused to done a sock on that foot secondary to pain.  Nursing notified and entered the room for a +2 for safety during amb attempt at EOB.  Pt initially attempted to take a step with LLE NWB sequencing but was unable to do so. Pt took several very small, antalgic steps with heavy lean on the RW at the EOB before returning to  sitting.  Pt reported that he is not interested in discharging to a SNF for rehab but would benefit from doing so based on his current functional limitations.       Follow Up Recommendations SNF    Equipment Recommendations  None recommended by PT    Recommendations for Other Services       Precautions / Restrictions Precautions Precautions: Fall Restrictions Weight Bearing Restrictions: No      Mobility  Bed Mobility Overal bed mobility: Needs Assistance Bed Mobility: Supine to Sit;Sit to Supine     Supine to sit: Min guard Sit to supine: Mod assist   General bed mobility comments: Assist for both trunk and BLEs required  Transfers Overall transfer level: Needs assistance Equipment used: Rolling walker (2 wheeled) Transfers: Sit to/from Stand Sit to Stand: Min assist         General transfer comment: Mod verbal cues for sequencing  Ambulation/Gait Ambulation/Gait assistance: +2 safety/equipment;Min guard Gait Distance (Feet): 1 Feet Assistive device: Rolling walker (2 wheeled) Gait Pattern/deviations: Step-to pattern;Antalgic;Decreased stance time - left Gait velocity: decreased   General Gait Details: Pt anxious regarding amb secondary to LLE pain and declined to wear sock to L foot.  Nursing in room for +2 for safety with pt only able to take several very small, antalgic steps  Stairs            Wheelchair Mobility    Modified Rankin (Stroke Patients Only)       Balance Overall balance assessment: Mild deficits  observed, not formally tested;Needs assistance   Sitting balance-Leahy Scale: Good     Standing balance support: Bilateral upper extremity supported Standing balance-Leahy Scale: Fair Standing balance comment: Heavy lean on the RW in standing but steady without LOB                             Pertinent Vitals/Pain Pain Assessment: 0-10 Pain Score: 6  Pain Location: LLE Pain Descriptors / Indicators: Sharp Pain  Intervention(s): Premedicated before session;Monitored during session;Limited activity within patient's tolerance    Home Living Family/patient expects to be discharged to:: Private residence Living Arrangements: Children Available Help at Discharge: Family;Neighbor;Available 24 hours/day Type of Home: House Home Access: Stairs to enter Entrance Stairs-Rails: Right;Left;Can reach both Entrance Stairs-Number of Steps: 4 Home Layout: One level Home Equipment: Cane - single point;Walker - 2 wheels      Prior Function Level of Independence: Independent with assistive device(s)         Comments: Pt reports Mod Ind amb with a RW HH distances recently; recent prior PT eval reported 8 recent falls but pt now states that has not had any falls in the last year     Hand Dominance        Extremity/Trunk Assessment   Upper Extremity Assessment Upper Extremity Assessment: Generalized weakness    Lower Extremity Assessment Lower Extremity Assessment: Generalized weakness       Communication   Communication: No difficulties  Cognition Arousal/Alertness: Awake/alert Behavior During Therapy: WFL for tasks assessed/performed Overall Cognitive Status: Within Functional Limits for tasks assessed                                        General Comments      Exercises     Assessment/Plan    PT Assessment Patient needs continued PT services  PT Problem List Decreased strength;Decreased activity tolerance;Decreased balance;Decreased mobility;Decreased knowledge of use of DME       PT Treatment Interventions Therapeutic exercise;DME instruction;Gait training;Stair training;Functional mobility training;Therapeutic activities;Patient/family education;Balance training    PT Goals (Current goals can be found in the Care Plan section)  Acute Rehab PT Goals Patient Stated Goal: To get stronger and return home PT Goal Formulation: With patient Time For Goal Achievement:  10/05/18 Potential to Achieve Goals: Fair    Frequency Min 2X/week   Barriers to discharge Inaccessible home environment      Co-evaluation               AM-PAC PT "6 Clicks" Mobility  Outcome Measure Help needed turning from your back to your side while in a flat bed without using bedrails?: A Little Help needed moving from lying on your back to sitting on the side of a flat bed without using bedrails?: A Lot Help needed moving to and from a bed to a chair (including a wheelchair)?: A Little Help needed standing up from a chair using your arms (e.g., wheelchair or bedside chair)?: A Little Help needed to walk in hospital room?: A Lot Help needed climbing 3-5 steps with a railing? : A Lot 6 Click Score: 15    End of Session Equipment Utilized During Treatment: Gait belt Activity Tolerance: No increased pain;Patient tolerated treatment well Patient left: in bed;with call bell/phone within reach;with nursing/sitter in room Nurse Communication: Mobility status PT Visit Diagnosis: Unsteadiness on feet (R26.81);Difficulty in  walking, not elsewhere classified (R26.2);Muscle weakness (generalized) (M62.81);Pain Pain - Right/Left: Left Pain - part of body: Leg    Time: 1030-1055 PT Time Calculation (min) (ACUTE ONLY): 25 min   Charges:   PT Evaluation $PT Eval Low Complexity: 1 Low          D. Scott Briston Lax PT, DPT 09/22/18, 11:49 AM

## 2018-09-22 NOTE — Progress Notes (Signed)
[facility]  ROUNDING NOTE   Subjective:   Admitted to ICU.   UOP 4425.   Patient is depressed.   CTA with contrast yesterday  Objective:  Vital signs in last 24 hours:  Temp:  [97.5 F (36.4 C)-98.6 F (37 C)] 98.6 F (37 C) (08/04 0150) Pulse Rate:  [54-82] 54 (08/04 0800) Resp:  [12-26] 12 (08/03 2000) BP: (109-148)/(42-84) 109/61 (08/04 0800) SpO2:  [95 %-100 %] 95 % (08/03 2000) Weight:  [110.4 kg-110.7 kg] 110.4 kg (08/03 1700)  Weight change:  Filed Weights   09/21/18 0931 09/21/18 1700  Weight: 110.7 kg 110.4 kg    Intake/Output: I/O last 3 completed shifts: In: 100.1 [IV Piggyback:100.1] Out: 5732 [Urine:4425]   Intake/Output this shift:  No intake/output data recorded.  Physical Exam: General: NAD, laying in bed  Head: Normocephalic, atraumatic. Moist oral mucosal membranes  Eyes: Anicteric, PERRL  Neck: Supple, trachea midline  Lungs:  clear  Heart: Regular rate and rhythm  Abdomen:  Soft, nontender,   Extremities:  +++peripheral edema. +weeping  Neurologic: Nonfocal, moving all four extremities  Skin: No lesions        Basic Metabolic Panel: Recent Labs  Lab 09/17/18 0639 09/21/18 0938 09/22/18 0522  NA 139 137 139  K 3.9 3.9 3.3*  CL 99 95* 93*  CO2 27 31 34*  GLUCOSE 159* 129* 114*  BUN 39* 41* 36*  CREATININE 1.70* 1.87* 1.94*  CALCIUM 8.9 8.5* 8.0*  PHOS 2.8  --   --     Liver Function Tests: Recent Labs  Lab 09/17/18 0639  ALBUMIN 3.4*   No results for input(s): LIPASE, AMYLASE in the last 168 hours. No results for input(s): AMMONIA in the last 168 hours.  CBC: Recent Labs  Lab 09/17/18 0639 09/21/18 0938  WBC 19.1* 10.6*  NEUTROABS  --  8.8*  HGB 14.0 13.7  HCT 44.4 43.3  MCV 93.9 95.8  PLT 261 181    Cardiac Enzymes: No results for input(s): CKTOTAL, CKMB, CKMBINDEX, TROPONINI in the last 168 hours.  BNP: Invalid input(s): POCBNP  CBG: Recent Labs  Lab 09/19/18 0748 09/19/18 1137  09/21/18 1725 09/21/18 2118 09/22/18 0741  GLUCAP 113* 221* 111* 116* 107*    Microbiology: Results for orders placed or performed during the hospital encounter of 09/21/18  SARS Coronavirus 2 [facility] order, Performed in [facility] hospital lab) Nasopharyngeal Nasopharyngeal Swab     Status: None   Collection Time: 09/21/18 11:40 AM   Specimen: Nasopharyngeal Swab  Result Value Ref Range Status   SARS Coronavirus 2 NEGATIVE NEGATIVE Final    Comment: (NOTE) If result is NEGATIVE SARS-CoV-2 target nucleic acids are NOT DETECTED. The SARS-CoV-2 RNA is generally detectable in upper and lower  respiratory specimens during the acute phase of infection. The lowest  concentration of SARS-CoV-2 viral copies this assay can detect is 250  copies / mL. A negative result does not preclude SARS-CoV-2 infection  and should not be used as the sole basis for treatment or other  patient management decisions.  A negative result may occur with  improper specimen collection / handling, submission of specimen other  than nasopharyngeal swab, presence of viral mutation(s) within the  areas targeted by this assay, and inadequate number of viral copies  (<250 copies / mL). A negative result must be combined with clinical  observations, patient history, and epidemiological information. If result is POSITIVE SARS-CoV-2 target nucleic acids are DETECTED. The SARS-CoV-2 RNA is generally detectable in  upper and lower  respiratory specimens dur ing the acute phase of infection.  Positive  results are indicative of active infection with SARS-CoV-2.  Clinical  correlation with patient history and other diagnostic information is  necessary to determine patient infection status.  Positive results do  not rule out bacterial infection or co-infection with other viruses. If result is PRESUMPTIVE POSTIVE SARS-CoV-2 nucleic acids MAY BE PRESENT.   A presumptive positive result was obtained on the submitted  specimen  and confirmed on repeat testing.  While 2019 novel coronavirus  (SARS-CoV-2) nucleic acids may be present in the submitted sample  additional confirmatory testing may be necessary for epidemiological  and / or clinical management purposes  to differentiate between  SARS-CoV-2 and other Sarbecovirus currently known to infect humans.  If clinically indicated additional testing with an alternate test  methodology [phone]) is advised. The SARS-CoV-2 RNA is generally  detectable in upper and lower respiratory sp ecimens during the acute  phase of infection. The expected result is Negative. Fact Sheet for Patients:  StrictlyIdeas.no Fact Sheet for Healthcare Providers: BankingDealers.co.za This test is not yet approved or cleared by the [place] FDA and has been authorized for detection and/or diagnosis of SARS-CoV-2 by FDA under an Emergency Use Authorization (EUA).  This EUA will remain in effect (meaning this test can be used) for the duration of the COVID-19 declaration under Section 564(b)(1) of the Act, 21 U.S.C. section 360bbb-3(b)(1), unless the authorization is terminated or revoked sooner. Performed at [facility], [address] [place], [place] [address]   MRSA PCR Screening     Status: None   Collection Time: 09/21/18  5:11 PM   Specimen: Nasopharyngeal  Result Value Ref Range Status   MRSA by PCR NEGATIVE NEGATIVE Final    Comment:        The GeneXpert MRSA Assay (FDA approved for NASAL specimens only), is one component of a comprehensive MRSA colonization surveillance program. It is not intended to diagnose MRSA infection nor to guide or monitor treatment for MRSA infections. Performed at [facility], [address] [place], [place] [address]     Coagulation Studies: No results for input(s): LABPROT, INR in the last 72 hours.  Urinalysis: No results for input(s): COLORURINE,  LABSPEC, PHURINE, GLUCOSEU, HGBUR, BILIRUBINUR, KETONESUR, PROTEINUR, UROBILINOGEN, NITRITE, LEUKOCYTESUR in the last 72 hours.  Invalid input(s): APPERANCEUR    Imaging: Ct Angio Chest Pe W And/or Wo Contrast  Result Date: 09/21/2018 CLINICAL DATA:  Shortness of breath.  Lower extremity edema EXAM: CT ANGIOGRAPHY CHEST WITH CONTRAST TECHNIQUE: Multidetector CT imaging of the chest was performed using the standard protocol during bolus administration of intravenous contrast. Multiplanar CT image reconstructions and MIPs were obtained to evaluate the vascular anatomy. CONTRAST:  43m OMNIPAQUE IOHEXOL 350 MG/ML SOLN COMPARISON:  Chest CT September 11, 2018 FINDINGS: Cardiovascular: There is no demonstrable pulmonary embolus. There is no evident thoracic aortic aneurysm or dissectionww. The visualized great vessels appear unremarkable. There is aortic atherosclerosis. There are foci of coronary artery calcification. There is a moderate pericardial effusion. Pacemaker leads are attached to the right atrium and right ventricle. Mediastinum/Nodes: Thyroid appears unremarkable. There is a mass which extends from the subcarinal region to the level of the right hemidiaphragm. This large mass measures 12.4 x 7.1 x 5.7 cm. It is difficult to assess whether this mass arises from the mediastinum versus lung parenchyma. There may be subcarinal adenopathy indistinguishable from adjacent mass. Note that this mass extends through  the azygoesophageal recess, similar to recent CT. No other findings suggesting adenopathy. The subcarinal region mass impresses upon the esophagus but does not convincingly invade the esophagus. There is moderate fluid and air in the esophagus which may be secondary to compression or possibly a degree of esophageal invasion distally. Lungs/Pleura: Moderate free-flowing pleural effusions are evident. There is consolidation in the right lower lobe felt to represent pneumonitis, separate from the  questionable mass arising in the right lower lobe and extending into the subcarinal region. A nodular lesion in the lateral segment of the right lower lobe is again noted, measuring 2.0 x 1.4 cm, a potential metastasis. Upper Abdomen: There are multiple liver masses, likely due to metastatic foci. Liver contour is indicative of cirrhosis. Visualized upper abdominal structures otherwise appear unremarkable. Musculoskeletal: There is extensive thoracic arthropathy with diffuse idiopathic skeletal hyperostosis. No blastic or lytic bone lesions are evident. No chest wall lesions are evident. There is advanced arthropathy in each shoulder. Note that pacemaker is present on the left anteriorly. Review of the MIP images confirms the above findings. IMPRESSION: 1. No demonstrable pulmonary embolus. No thoracic aortic aneurysm or dissection. There is aortic atherosclerosis. There are foci of coronary artery calcification. 2. The large mass involving both right upper lobe and subcarinal region again noted. It is difficult to ascertain whether this mass arises from the subcarinal mediastinum and extends in the right lung versus extending from the right lung into the mediastinum. This mass impresses upon and may invade the esophagus. It measures 12.4 x 7.1 x 5.7 cm. 3. Probable metastasis in the right middle lobe measuring 2.0 x 1.4 cm. 4. Multiple liver metastases evident. There is underlying hepatic cirrhosis. 5.  Moderate pericardial effusion. 6. Bilateral pleural effusions with areas of consolidation felt to represent pneumonia in the right lower lobe. 7.  Pacemaker leads attached to right atrium and right ventricle. Aortic Atherosclerosis (ICD10-I70.0). Electronically Signed   By: Lowella Grip III M.D.   On: 09/21/2018 11:26   Dg Chest Portable 1 View  Result Date: 09/21/2018 CLINICAL DATA:  Shortness of breath EXAM: PORTABLE CHEST 1 VIEW COMPARISON:  September 15, 2018 FINDINGS: There are small pleural effusions  bilaterally with bibasilar atelectasis. There is no frank edema or consolidation. Heart is mildly enlarged with pulmonary vascularity normal. Pacemaker leads are attached to the right atrium and right ventricle. No adenopathy. There is arthropathy in each shoulder. IMPRESSION: Mild cardiomegaly. Pacemaker leads attached to right atrium and right ventricle. Small pleural effusions with bibasilar atelectasis. No edema or consolidation evident. Electronically Signed   By: Lowella Grip III M.D.   On: 09/21/2018 10:10     Medications:   . cefTRIAXone (ROCEPHIN)  IV Stopped (09/21/18 1854)   . aspirin EC  81 mg Oral Daily  . azithromycin  500 mg Oral Daily  . [START ON 10/03/2018] cyanocobalamin  1,000 mcg Intramuscular Q30 days  . furosemide  40 mg Intravenous Q12H  . heparin  5,000 Units Subcutaneous Q8H  . insulin aspart  0-5 Units Subcutaneous QHS  . insulin aspart  0-9 Units Subcutaneous TID WC  . insulin aspart protamine- aspart  10 Units Subcutaneous Q breakfast  . ipratropium-albuterol  3 mL Inhalation TID  . potassium chloride  40 mEq Oral Once  . simvastatin  40 mg Oral QHS     Assessment/ Plan:  Mr. Javier Wong is a 85 y.o. white male withhypertension, hyperlipidemia, history of complete heart block status post dual chamber pacemaker placement, diabetes mellitus  type II, gout, who was admitted to [facility] on7/23/2020. Found to have right lung mass.   1. Acute renal failure: on chronic kidney disease stage III with proteinuria:  baseline creatinine 1.5, EGFR 45 on 05/20/2018. Chronic kidney disease secondary to NSAIDs and diabetes Acute renal failure could be secondary to obstructive uropathy versus acute cardiorenal syndrome.  Urine output improved with foley catheter insertion - No indication for dialysis.  - Holding diuretics   2. Hypertension: and acute exacerbation of congestive heart failure and weeping peripheral edema - holding diuretics  3. Hypokalemia:  secondary to post obstructive diuresis - PO potassium replacement.    LOS: 1 Kimara Bencomo 8/4/20208:55 AM

## 2018-09-22 NOTE — Consult Note (Addendum)
Consultation Note Date: 09/22/2018   Patient Name: Javier Wong  DOB: [birth year]  MRN: [id number]  Age / Sex: 85 y.o., male  PCP: Glendon Axe, MD Referring Physician: Max Sane, MD  Reason for Consultation: Establishing goals of care  HPI/Patient Profile: Javier Wong 85 y.o.  male pleasant but unfortunate patient with recent admission for acute renal failure/noted to have right lower lobe lung mass bulky mediastinal adenopathy-also liver lesions.    Clinical Assessment and Goals of Care: Patient is resting in bed. He was recently discharged over the weekend. He states he began having swelling and needed to return to the hospital. He states he  just spoke to oncology, and has been advised treatment would not be recommended for him. He states his wife had hospice at home and this is what he wants as well. He states his children will help care for him. He requests particularly that I do not call his children, he states he will tell them himself. He requests to be medically optimized over the next couple of days as needed prior to discharge. He states "let God control what happens from here."   Recommend home with hospice once medically optimized.     SUMMARY OF RECOMMENDATIONS   Home with hospice when medically optimized.    Prognosis:   < 6 months  Discharge Planning: Home with Hospice      Primary Diagnoses: Present on Admission: . Acute respiratory failure with hypoxemia ([place]) . Mass of lower lobe of right lung   I have reviewed the medical record, interviewed the patient and family, and examined the patient. The following aspects are pertinent.  Past Medical History:  Diagnosis Date  . Chronic kidney disease   . Diabetes type 2, controlled ([place])   . Hypertension    Social History   Socioeconomic History  . Marital status: Married    Spouse name: Not on file  . Number  of children: Not on file  . Years of education: Not on file  . Highest education level: Not on file  Occupational History  . Not on file  Social Needs  . Financial resource strain: Not on file  . Food insecurity    Worry: Not on file    Inability: Not on file  . Transportation needs    Medical: Not on file    Non-medical: Not on file  Tobacco Use  . Smoking status: Never Smoker  . Smokeless tobacco: Never Used  Substance and Sexual Activity  . Alcohol use: Never    Frequency: Never  . Drug use: Never  . Sexual activity: Not Currently  Lifestyle  . Physical activity    Days per week: Not on file    Minutes per session: Not on file  . Stress: Not on file  Relationships  . Social Herbalist on phone: Not on file    Gets together: Not on file    Attends religious service: Not on file    Active member of club or  organization: Not on file    Attends meetings of clubs or organizations: Not on file    Relationship status: Not on file  Other Topics Concern  . Not on file  Social History Narrative   Patient is alone.  He is fairly active for his age.  Denies any prior history of smoking.  No alcohol.  He admits to exposure to asbestos.    Family History  Problem Relation Age of Onset  . Mesothelioma Father    Scheduled Meds: . aspirin EC  81 mg Oral Daily  . azithromycin  500 mg Oral Daily  . [START ON 10/03/2018] cyanocobalamin  1,000 mcg Intramuscular Q30 days  . furosemide  40 mg Intravenous Q12H  . heparin  5,000 Units Subcutaneous Q8H  . insulin aspart  0-5 Units Subcutaneous QHS  . insulin aspart  0-9 Units Subcutaneous TID WC  . insulin aspart protamine- aspart  10 Units Subcutaneous Q breakfast  . ipratropium-albuterol  3 mL Inhalation TID  . simvastatin  40 mg Oral QHS   Continuous Infusions: . cefTRIAXone (ROCEPHIN)  IV Stopped (09/21/18 1854)   PRN Meds:.acetaminophen **OR** acetaminophen, HYDROcodone-acetaminophen, ondansetron **OR** ondansetron  (ZOFRAN) IV, polyethylene glycol, traMADol Medications Prior to Admission:  Prior to Admission medications   Medication Sig Start Date End Date Taking? Authorizing Provider  allopurinol (ZYLOPRIM) 100 MG tablet Take 100 mg by mouth daily. 07/10/18  Yes [provider]  aspirin EC 81 MG tablet Take 81 mg by mouth daily.   Yes [provider]  cyanocobalamin (,VITAMIN B-12,) 1000 MCG/ML injection Inject 1,000 mcg into the muscle every 30 (thirty) days. 03/24/18  Yes [provider]  furosemide (LASIX) 40 MG tablet Take 40 mg by mouth daily. 02/20/18  Yes [provider]  insulin lispro protamine-lispro (HUMALOG 75/25 MIX) (75-25) 100 UNIT/ML SUSP injection Inject 10 Units into the skin daily with breakfast. 02/20/18  Yes [provider]  Ipratropium-Albuterol (COMBIVENT RESPIMAT) 20-100 MCG/ACT AERS respimat Inhale 1 puff into the lungs 3 (three) times daily. 09/19/18  Yes Sudini, Alveta Heimlich, MD  metFORMIN (GLUCOPHAGE) 1000 MG tablet Take 1,000 mg by mouth 2 (two) times daily with a meal.   Yes [provider]  naproxen sodium (ALEVE) 220 MG tablet Take 220 mg by mouth daily as needed.   Yes [provider]  predniSONE (DELTASONE) 10 MG tablet Take 2 tablets (20 mg total) by mouth daily. 09/19/18  Yes Sudini, Alveta Heimlich, MD  simvastatin (ZOCOR) 40 MG tablet Take 40 mg by mouth at bedtime. 02/20/18  Yes [provider]  traMADol (ULTRAM) 50 MG tablet Take 50 mg by mouth every 8 (eight) hours as needed for pain. 04/14/18  Yes [provider]   No Known Allergies Review of Systems  Constitutional: Positive for fatigue.  Cardiovascular: Positive for leg swelling.    Physical Exam Pulmonary:     Effort: Pulmonary effort is normal.  Neurological:     Mental Status: He is alert.     Vital Signs: BP (!) 109/47 (BP Location: Right Arm)   Pulse (!) 59   Temp 98.4 F (36.9 C) (Oral)   Resp 19   Ht 5\' 11"  (1.803 m)   Wt 110.4 kg   SpO2  93%   BMI 33.95 kg/m  Pain Scale: 0-10 POSS *See Group Information*: S-Acceptable,Sleep, easy to arouse Pain Score: 0-No pain   SpO2: SpO2: 93 % O2 Device:SpO2: 93 % O2 Flow Rate: .O2 Flow Rate (L/min): 2 L/min  IO: Intake/output summary:  Intake/Output Summary (Last 24 hours) at 09/22/2018 1554 Last data filed at 09/22/2018 1200 Gross per 24 hour  Intake 340.06 ml  Output 4875 ml  Net -4534.94 ml    LBM: Last BM Date: 09/20/18 Baseline Weight: Weight: 110.7 kg Most recent weight: Weight: 110.4 kg     Palliative Assessment/Data: 40% at best     Time In: 3:20 Time Out: 4:00 Time Total: 40 min Greater than 50%  of this time was spent counseling and coordinating care related to the above assessment and plan.  Signed by: Asencion Gowda, NP   Please contact Palliative Medicine Team phone at [phone] for questions and concerns.  For individual provider: See Shea Evans

## 2018-09-22 NOTE — Progress Notes (Signed)
Javier Wong   DOB:1932-10-07   PY#:[id number]    Subjective: Overnight uneventful.  Patient continues to have swelling in the legs/weeping edema.  Overall he feels poorly.  Objective:  Vitals:   09/22/18 0800 09/22/18 1231  BP: 109/61 (!) 109/47  Pulse: (!) 54 (!) 59  Resp:  19  Temp:  98.4 F (36.9 C)  SpO2:  93%     Intake/Output Summary (Last 24 hours) at 09/22/2018 1400 Last data filed at 09/22/2018 0955 Gross per 24 hour  Intake 340.06 ml  Output 4575 ml  Net -4234.94 ml    Physical Exam  Constitutional: He is oriented to person, place, and time.  Sick appearing Caucasian male patient resting in the bed.  Is currently off oxygen saturating-92%.  HENT:  Head: Normocephalic and atraumatic.  Mouth/Throat: Oropharynx is clear and moist. No oropharyngeal exudate.  Eyes: Pupils are equal, round, and reactive to light.  Neck: Normal range of motion. Neck supple.  Cardiovascular: Normal rate and regular rhythm.  Pulmonary/Chest: No respiratory distress. He has no wheezes.  Decreased breath sounds bilaterally.  Abdominal: Soft. Bowel sounds are normal. He exhibits no distension and no mass. There is no abdominal tenderness. There is no rebound and no guarding.  Musculoskeletal: Normal range of motion.        General: Edema present. No tenderness.     Comments: Bilateral leg swelling noted/weeping edema noted.  Neurological: He is alert and oriented to person, place, and time.  Skin: Skin is warm.  Psychiatric: Affect normal.     Labs:  Lab Results  Component Value Date   WBC 10.6 (H) 09/21/2018   HGB 13.7 09/21/2018   HCT 43.3 09/21/2018   MCV 95.8 09/21/2018   PLT 181 09/21/2018   NEUTROABS 8.8 (H) 09/21/2018    Lab Results  Component Value Date   NA 139 09/22/2018   K 3.3 (L) 09/22/2018   CL 93 (L) 09/22/2018   CO2 34 (H) 09/22/2018    Studies:  Ct Angio Chest Pe W And/or Wo Contrast  Result Date: 09/21/2018 CLINICAL DATA:  Shortness of breath.  Lower  extremity edema EXAM: CT ANGIOGRAPHY CHEST WITH CONTRAST TECHNIQUE: Multidetector CT imaging of the chest was performed using the standard protocol during bolus administration of intravenous contrast. Multiplanar CT image reconstructions and MIPs were obtained to evaluate the vascular anatomy. CONTRAST:  46mL OMNIPAQUE IOHEXOL 350 MG/ML SOLN COMPARISON:  Chest CT September 11, 2018 FINDINGS: Cardiovascular: There is no demonstrable pulmonary embolus. There is no evident thoracic aortic aneurysm or dissectionww. The visualized great vessels appear unremarkable. There is aortic atherosclerosis. There are foci of coronary artery calcification. There is a moderate pericardial effusion. Pacemaker leads are attached to the right atrium and right ventricle. Mediastinum/Nodes: Thyroid appears unremarkable. There is a mass which extends from the subcarinal region to the level of the right hemidiaphragm. This large mass measures 12.4 x 7.1 x 5.7 cm. It is difficult to assess whether this mass arises from the mediastinum versus lung parenchyma. There may be subcarinal adenopathy indistinguishable from adjacent mass. Note that this mass extends through the azygoesophageal recess, similar to recent CT. No other findings suggesting adenopathy. The subcarinal region mass impresses upon the esophagus but does not convincingly invade the esophagus. There is moderate fluid and air in the esophagus which may be secondary to compression or possibly a degree of esophageal invasion distally. Lungs/Pleura: Moderate free-flowing pleural effusions are evident. There is consolidation in the right lower lobe felt to represent  pneumonitis, separate from the questionable mass arising in the right lower lobe and extending into the subcarinal region. A nodular lesion in the lateral segment of the right lower lobe is again noted, measuring 2.0 x 1.4 cm, a potential metastasis. Upper Abdomen: There are multiple liver masses, likely due to metastatic  foci. Liver contour is indicative of cirrhosis. Visualized upper abdominal structures otherwise appear unremarkable. Musculoskeletal: There is extensive thoracic arthropathy with diffuse idiopathic skeletal hyperostosis. No blastic or lytic bone lesions are evident. No chest wall lesions are evident. There is advanced arthropathy in each shoulder. Note that pacemaker is present on the left anteriorly. Review of the MIP images confirms the above findings. IMPRESSION: 1. No demonstrable pulmonary embolus. No thoracic aortic aneurysm or dissection. There is aortic atherosclerosis. There are foci of coronary artery calcification. 2. The large mass involving both right upper lobe and subcarinal region again noted. It is difficult to ascertain whether this mass arises from the subcarinal mediastinum and extends in the right lung versus extending from the right lung into the mediastinum. This mass impresses upon and may invade the esophagus. It measures 12.4 x 7.1 x 5.7 cm. 3. Probable metastasis in the right middle lobe measuring 2.0 x 1.4 cm. 4. Multiple liver metastases evident. There is underlying hepatic cirrhosis. 5.  Moderate pericardial effusion. 6. Bilateral pleural effusions with areas of consolidation felt to represent pneumonia in the right lower lobe. 7.  Pacemaker leads attached to right atrium and right ventricle. Aortic Atherosclerosis (ICD10-I70.0). Electronically Signed   By: Lowella Grip III M.D.   On: 09/21/2018 11:26   Dg Chest Portable 1 View  Result Date: 09/21/2018 CLINICAL DATA:  Shortness of breath EXAM: PORTABLE CHEST 1 VIEW COMPARISON:  September 15, 2018 FINDINGS: There are small pleural effusions bilaterally with bibasilar atelectasis. There is no frank edema or consolidation. Heart is mildly enlarged with pulmonary vascularity normal. Pacemaker leads are attached to the right atrium and right ventricle. No adenopathy. There is arthropathy in each shoulder. IMPRESSION: Mild cardiomegaly.  Pacemaker leads attached to right atrium and right ventricle. Small pleural effusions with bibasilar atelectasis. No edema or consolidation evident. Electronically Signed   By: Lowella Grip III M.D.   On: 09/21/2018 10:10    Mass of lower lobe of right lung 85 year old male patient with multiple medical problems including a right lower lobe mass/bulky mediastinal adenopathy/multiple liver lesions; CKD is currently admitted to hospital for worsening shortness of breath/worsening swelling in the legs  #Right lower lobe lung mass/bulky mediastinal adenopathy/pericardial effusion/liver lesions-highly suspicious for malignant process.  Liver biopsy [done on July 31st]-preliminary high-grade malignancy [dd: Lymphoma versus carcinoma]; immunohistochemical stains-pending.  Discussed with pathology.  See discussion below.  #Worsening respiratory status-multifactorial fluid overload/right lower lobe mass/adenopathy/question postobstructive pneumonia-stable.  On broad-spectrum antibiotic.   #Chronic kidney disease with a recent episode of acute renal failure; currently creatinine is 1.8; stable.  High risk of worsening renal insufficiency-given recent contrast.  Followed by nephrology.  #Had a long discussion the patient regarding results of the biopsy/malignancy.  Understands that pathology still primary-awaiting on further delineation.  He understands this is fairly aggressive malignancy-and he is well not a candidate for treatment given his performance status/renal sufficiency/respiratory issues.  Discussed that without treatment the life expectancy is in the order of few weeks to few months.  Clearly understands the limitations/risk of side effects in the therapeutic option for his underlying malignancy.  He states that he wants to go home and die.  Patient declines skilled  nursing home placement given the concerns for COVID-19.  I will discuss with the patient's son, Jenny Reichmann.    Cammie Sickle,  MD 09/22/2018  2:00 PM

## 2018-09-22 NOTE — Progress Notes (Signed)
Patient son Jenny Reichmann called and message left. Family was aware of potentially transferring out of the unit. Son updated 8am.

## 2018-09-22 NOTE — Progress Notes (Signed)
Report given to [facility] RN for patient to be transferred to room 259. Son has been updated.

## 2018-09-22 NOTE — Plan of Care (Signed)
  Problem: Coping: Goal: Ability to identify and develop effective coping behavior will improve Outcome: Progressing Note: Patient appears to accept terminal diagnosis.   Problem: Education: Goal: Ability to demonstrate management of disease process will improve Outcome: Not Progressing Note: Patient ignores instructions for fluid intake. Goal: Ability to verbalize understanding of medication therapies will improve Outcome: Not Progressing

## 2018-09-23 ENCOUNTER — Telehealth: Payer: Self-pay | Admitting: *Deleted

## 2018-09-23 DIAGNOSIS — L8990 Pressure ulcer of unspecified site, unspecified stage: Secondary | ICD-10-CM | POA: Insufficient documentation

## 2018-09-23 LAB — CBC
HCT: 42.1 % (ref 39.0–52.0)
Hemoglobin: 13.5 g/dL (ref 13.0–17.0)
MCH: 30.1 pg (ref 26.0–34.0)
MCHC: 32.1 g/dL (ref 30.0–36.0)
MCV: 94 fL (ref 80.0–100.0)
Platelets: 194 10*3/uL (ref 150–400)
RBC: 4.48 MIL/uL (ref 4.22–5.81)
RDW: 14.1 % (ref 11.5–15.5)
WBC: 10.8 10*3/uL — ABNORMAL HIGH (ref 4.0–10.5)
nRBC: 0 % (ref 0.0–0.2)

## 2018-09-23 LAB — BASIC METABOLIC PANEL
Anion gap: 14 (ref 5–15)
BUN: 36 mg/dL — ABNORMAL HIGH (ref 8–23)
CO2: 32 mmol/L (ref 22–32)
Calcium: 8.3 mg/dL — ABNORMAL LOW (ref 8.9–10.3)
Chloride: 94 mmol/L — ABNORMAL LOW (ref 98–111)
Creatinine, Ser: 2.02 mg/dL — ABNORMAL HIGH (ref 0.61–1.24)
GFR calc Af Amer: 34 mL/min — ABNORMAL LOW (ref >60)
GFR calc non Af Amer: 29 mL/min — ABNORMAL LOW (ref >60)
Glucose, Bld: 126 mg/dL — ABNORMAL HIGH (ref 70–99)
Potassium: 3.5 mmol/L (ref 3.5–5.1)
Sodium: 140 mmol/L (ref 135–145)

## 2018-09-23 LAB — GLUCOSE, CAPILLARY
Glucose-Capillary: 132 mg/dL — ABNORMAL HIGH (ref 70–99)
Glucose-Capillary: 133 mg/dL — ABNORMAL HIGH (ref 70–99)

## 2018-09-23 MED ORDER — AMOXICILLIN-POT CLAVULANATE 875-125 MG PO TABS: 1.0000 | ORAL_TABLET | Freq: Two times a day (BID) | ORAL | 0 refills | Status: AC

## 2018-09-23 NOTE — Progress Notes (Signed)
[facility]  ROUNDING NOTE   Subjective:   Son at bedside. Patient and family have decided on home with hospice.   Creatinine 2.02 (1.94)  UOP 1100 recorded  Objective:  Vital signs in last 24 hours:  Temp:  [98.3 F (36.8 C)-98.5 F (36.9 C)] 98.5 F (36.9 C) (08/05 0745) Pulse Rate:  [54-61] 56 (08/05 0745) Resp:  [16-19] 18 (08/05 0745) BP: (96-133)/(47-63) 133/63 (08/05 0745) SpO2:  [93 %-100 %] 98 % (08/05 0745) Weight:  [106.9 kg] 106.9 kg (08/05 0339)  Weight change: -3.765 kg Filed Weights   09/21/18 0931 09/21/18 1700 09/23/18 0339  Weight: 110.7 kg 110.4 kg 106.9 kg    Intake/Output: I/O last 3 completed shifts: In: 290.9 [P.O.:240; IV Piggyback:50.9] Out: 3025 [QMGQQ:7619]   Intake/Output this shift:  Total I/O In: 480 [P.O.:480] Out: 150 [Urine:150]  Physical Exam: General: NAD, sitting up in bed  Head: Normocephalic, atraumatic. Moist oral mucosal membranes  Eyes: Anicteric, PERRL  Neck: Supple, trachea midline  Lungs:  clear  Heart: Regular rate and rhythm  Abdomen:  Soft, nontender,   Extremities:  + peripheral edema. +weeping  Neurologic: Nonfocal, moving all four extremities  Skin: No lesions        Basic Metabolic Panel: Recent Labs  Lab 09/17/18 0639 09/21/18 0938 09/22/18 0522 09/23/18 0539  NA 139 137 139 140  K 3.9 3.9 3.3* 3.5  CL 99 95* 93* 94*  CO2 27 31 34* 32  GLUCOSE 159* 129* 114* 126*  BUN 39* 41* 36* 36*  CREATININE 1.70* 1.87* 1.94* 2.02*  CALCIUM 8.9 8.5* 8.0* 8.3*  PHOS 2.8  --   --   --     Liver Function Tests: Recent Labs  Lab 09/17/18 0639  ALBUMIN 3.4*   No results for input(s): LIPASE, AMYLASE in the last 168 hours. No results for input(s): AMMONIA in the last 168 hours.  CBC: Recent Labs  Lab 09/17/18 0639 09/21/18 0938 09/23/18 0539  WBC 19.1* 10.6* 10.8*  NEUTROABS  --  8.8*  --   HGB 14.0 13.7 13.5  HCT 44.4 43.3 42.1  MCV 93.9 95.8 94.0  PLT 261 181 194    Cardiac  Enzymes: No results for input(s): CKTOTAL, CKMB, CKMBINDEX, TROPONINI in the last 168 hours.  BNP: Invalid input(s): POCBNP  CBG: Recent Labs  Lab 09/22/18 0741 09/22/18 1129 09/22/18 1642 09/22/18 2058 09/23/18 0746  GLUCAP 107* 142* 161* 136* 132*    Microbiology: Results for orders placed or performed during the hospital encounter of 09/21/18  SARS Coronavirus 2 [facility] order, Performed in [facility] hospital lab) Nasopharyngeal Nasopharyngeal Swab     Status: None   Collection Time: 09/21/18 11:40 AM   Specimen: Nasopharyngeal Swab  Result Value Ref Range Status   SARS Coronavirus 2 NEGATIVE NEGATIVE Final    Comment: (NOTE) If result is NEGATIVE SARS-CoV-2 target nucleic acids are NOT DETECTED. The SARS-CoV-2 RNA is generally detectable in upper and lower  respiratory specimens during the acute phase of infection. The lowest  concentration of SARS-CoV-2 viral copies this assay can detect is 250  copies / mL. A negative result does not preclude SARS-CoV-2 infection  and should not be used as the sole basis for treatment or other  patient management decisions.  A negative result may occur with  improper specimen collection / handling, submission of specimen other  than nasopharyngeal swab, presence of viral mutation(s) within the  areas targeted by this assay, and inadequate number of viral copies  (<  250 copies / mL). A negative result must be combined with clinical  observations, patient history, and epidemiological information. If result is POSITIVE SARS-CoV-2 target nucleic acids are DETECTED. The SARS-CoV-2 RNA is generally detectable in upper and lower  respiratory specimens dur ing the acute phase of infection.  Positive  results are indicative of active infection with SARS-CoV-2.  Clinical  correlation with patient history and other diagnostic information is  necessary to determine patient infection status.  Positive results do  not rule out bacterial  infection or co-infection with other viruses. If result is PRESUMPTIVE POSTIVE SARS-CoV-2 nucleic acids MAY BE PRESENT.   A presumptive positive result was obtained on the submitted specimen  and confirmed on repeat testing.  While 2019 novel coronavirus  (SARS-CoV-2) nucleic acids may be present in the submitted sample  additional confirmatory testing may be necessary for epidemiological  and / or clinical management purposes  to differentiate between  SARS-CoV-2 and other Sarbecovirus currently known to infect humans.  If clinically indicated additional testing with an alternate test  methodology [phone]) is advised. The SARS-CoV-2 RNA is generally  detectable in upper and lower respiratory sp ecimens during the acute  phase of infection. The expected result is Negative. Fact Sheet for Patients:  StrictlyIdeas.no Fact Sheet for Healthcare Providers: BankingDealers.co.za This test is not yet approved or cleared by the [place] FDA and has been authorized for detection and/or diagnosis of SARS-CoV-2 by FDA under an Emergency Use Authorization (EUA).  This EUA will remain in effect (meaning this test can be used) for the duration of the COVID-19 declaration under Section 564(b)(1) of the Act, 21 U.S.C. section 360bbb-3(b)(1), unless the authorization is terminated or revoked sooner. Performed at [facility], [address] [place], [place] [address]   MRSA PCR Screening     Status: None   Collection Time: 09/21/18  5:11 PM   Specimen: Nasopharyngeal  Result Value Ref Range Status   MRSA by PCR NEGATIVE NEGATIVE Final    Comment:        The GeneXpert MRSA Assay (FDA approved for NASAL specimens only), is one component of a comprehensive MRSA colonization surveillance program. It is not intended to diagnose MRSA infection nor to guide or monitor treatment for MRSA infections. Performed at [facility],  [address] [place], [place] [address]     Coagulation Studies: No results for input(s): LABPROT, INR in the last 72 hours.  Urinalysis: No results for input(s): COLORURINE, LABSPEC, PHURINE, GLUCOSEU, HGBUR, BILIRUBINUR, KETONESUR, PROTEINUR, UROBILINOGEN, NITRITE, LEUKOCYTESUR in the last 72 hours.  Invalid input(s): APPERANCEUR    Imaging: Ct Angio Chest Pe W And/or Wo Contrast  Result Date: 09/21/2018 CLINICAL DATA:  Shortness of breath.  Lower extremity edema EXAM: CT ANGIOGRAPHY CHEST WITH CONTRAST TECHNIQUE: Multidetector CT imaging of the chest was performed using the standard protocol during bolus administration of intravenous contrast. Multiplanar CT image reconstructions and MIPs were obtained to evaluate the vascular anatomy. CONTRAST:  7m OMNIPAQUE IOHEXOL 350 MG/ML SOLN COMPARISON:  Chest CT September 11, 2018 FINDINGS: Cardiovascular: There is no demonstrable pulmonary embolus. There is no evident thoracic aortic aneurysm or dissectionww. The visualized great vessels appear unremarkable. There is aortic atherosclerosis. There are foci of coronary artery calcification. There is a moderate pericardial effusion. Pacemaker leads are attached to the right atrium and right ventricle. Mediastinum/Nodes: Thyroid appears unremarkable. There is a mass which extends from the subcarinal region to the level of the right hemidiaphragm. This large mass measures  12.4 x 7.1 x 5.7 cm. It is difficult to assess whether this mass arises from the mediastinum versus lung parenchyma. There may be subcarinal adenopathy indistinguishable from adjacent mass. Note that this mass extends through the azygoesophageal recess, similar to recent CT. No other findings suggesting adenopathy. The subcarinal region mass impresses upon the esophagus but does not convincingly invade the esophagus. There is moderate fluid and air in the esophagus which may be secondary to compression or possibly a degree of esophageal  invasion distally. Lungs/Pleura: Moderate free-flowing pleural effusions are evident. There is consolidation in the right lower lobe felt to represent pneumonitis, separate from the questionable mass arising in the right lower lobe and extending into the subcarinal region. A nodular lesion in the lateral segment of the right lower lobe is again noted, measuring 2.0 x 1.4 cm, a potential metastasis. Upper Abdomen: There are multiple liver masses, likely due to metastatic foci. Liver contour is indicative of cirrhosis. Visualized upper abdominal structures otherwise appear unremarkable. Musculoskeletal: There is extensive thoracic arthropathy with diffuse idiopathic skeletal hyperostosis. No blastic or lytic bone lesions are evident. No chest wall lesions are evident. There is advanced arthropathy in each shoulder. Note that pacemaker is present on the left anteriorly. Review of the MIP images confirms the above findings. IMPRESSION: 1. No demonstrable pulmonary embolus. No thoracic aortic aneurysm or dissection. There is aortic atherosclerosis. There are foci of coronary artery calcification. 2. The large mass involving both right upper lobe and subcarinal region again noted. It is difficult to ascertain whether this mass arises from the subcarinal mediastinum and extends in the right lung versus extending from the right lung into the mediastinum. This mass impresses upon and may invade the esophagus. It measures 12.4 x 7.1 x 5.7 cm. 3. Probable metastasis in the right middle lobe measuring 2.0 x 1.4 cm. 4. Multiple liver metastases evident. There is underlying hepatic cirrhosis. 5.  Moderate pericardial effusion. 6. Bilateral pleural effusions with areas of consolidation felt to represent pneumonia in the right lower lobe. 7.  Pacemaker leads attached to right atrium and right ventricle. Aortic Atherosclerosis (ICD10-I70.0). Electronically Signed   By: Lowella Grip III M.D.   On: 09/21/2018 11:26      Medications:   . cefTRIAXone (ROCEPHIN)  IV 2 g (09/22/18 1744)   . aspirin EC  81 mg Oral Daily  . azithromycin  500 mg Oral Daily  . [START ON 10/03/2018] cyanocobalamin  1,000 mcg Intramuscular Q30 days  . furosemide  40 mg Intravenous Q12H  . heparin  5,000 Units Subcutaneous Q8H  . insulin aspart  0-5 Units Subcutaneous QHS  . insulin aspart  0-9 Units Subcutaneous TID WC  . insulin aspart protamine- aspart  10 Units Subcutaneous Q breakfast  . ipratropium-albuterol  3 mL Inhalation TID  . simvastatin  40 mg Oral QHS     Assessment/ Plan:  Mr. IOANE BHOLA is a 85 y.o. 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 Found to have right lung mass.   1. Acute renal failure: on chronic kidney disease stage III with proteinuria:  baseline creatinine 1.5, EGFR 45 on 05/20/2018. Chronic kidney disease secondary to NSAIDs and diabetes Acute renal failure could be secondary to obstructive uropathy versus acute cardiorenal syndrome.  Urine output improved with foley catheter insertion - No indication for dialysis.  - Holding diuretics   2. Hypertension: and acute  exacerbation of congestive heart failure and weeping peripheral edema - holding diuretics  3. Hypokalemia: secondary to post obstructive diuresis - PO potassium replacement.    LOS: 2 Charice Zuno 8/5/202010:33 AM

## 2018-09-23 NOTE — Progress Notes (Signed)
New referral for [place] at home received from [place]. Patient is an 85 year old man admitted to [facility] on 8/3 for treatment of shortness of breath and increased lower extremity edema. Past medical history includes acute on chronic CKD stage 3, diastolic CHF, HTN and right lung mass with liver lesions. Due to his comorbitis, patient is not a candidate for chemo. He and his family have chosen to return home with the support of hospice services.  Patient seen sitting up on the side of the bed, son Oralia Rud at bedside. Patient alert and oriented and participated in the conversation. He stated that he was familiar with hospice services as his wife received hospice services in the home.  Probation officer initiated education regarding hospice services, philosophy and team approach to care. Writer also notified patient and his son that at this time due to [place] [facility] hospice is not able to provide volunteer services and that hospice is providing virtual visits upon request. Patient will need a BSC and nebulizer machine delivered to his home, this can be done today, but per Jenny Reichmann there is no one at the home to accept th DME and will need delivery after patient arrives home. Plan is for discharge via [facility] today. Patient will discharge as a full code, but did stat that he "does not want to come back to the hospital". Hospice contact number given to [facility]. Patient information and DME needs faxed to referral. Hospital care team updated.  Flo Shanks BSN, RN, [facility] liaison [facility] [phone]

## 2018-09-23 NOTE — Telephone Encounter (Signed)
VO called to Crystal at [facility]

## 2018-09-23 NOTE — Telephone Encounter (Signed)
I will sign his hospice orders. GB

## 2018-09-23 NOTE — TOC Initial Note (Signed)
Transition of Care [facility]) - Initial/Assessment Note    Patient Details  Name: Javier Wong MRN: [id number] Date of Birth: Feb 06, 1933  Transition of Care [facility]) CM/SW Contact:    Ross Ludwig, LCSW Phone Number: 09/23/2018, 10:38 AM  Clinical Narrative:                  Patient is an 85 year old male who is alert and oriented x4.  Patient states he was receiving home health services, he would now like to go home with hospice.  CSW spoke to patient and his son and he would like [facility] for hospice services.  CSW asked patient if he needed any equipment and he would like a bedside commode, and crutches or a walker.  Patient stated his son will be able to transport him home.  Patient did not want to be DNR, he decided he wants to be full code.  CSW explained to patient that if he chooses home with hospice, he would not be able to have home health PT nurse, aide, and social worker.  Patient was active with [facility], CSW to updated home health agency.  CSW to facilitate discharge planning.  Expected Discharge Plan: Home w Hospice Care Barriers to Discharge: Barriers Resolved   Patient Goals and CMS Choice Patient states their goals for this hospitalization and ongoing recovery are:: To return back home with home with hospice services. CMS Medicare.gov Compare Post Acute Care list provided to:: Patient Choice offered to / list presented to : Patient, Adult Children  Expected Discharge Plan and Services Expected Discharge Plan: Home w Hospice Care In-house Referral: Clinical Social Work   Post Acute Care Choice: Durable Medical Equipment, Hospice Living arrangements for the past 2 months: Single Family Home Expected Discharge Date: 09/23/18               DME Arranged: Bedside commode, Crutches DME Agency: Other - Comment([facility].) Date DME Agency Contacted: 09/23/18 Time DME Agency Contacted: [phone] Representative spoke with at DME Agency: Santiago Glad from  [employer]            Prior Living Arrangements/Services Living arrangements for the past 2 months: Single Family Home Lives with:: Self Patient language and need for interpreter reviewed:: Yes Do you feel safe going back to the place where you live?: Yes      Need for Family Participation in Patient Care: No (Comment) Care giver support system in place?: Yes (comment) Current home services: Hospice, DME Criminal Activity/Legal Involvement Pertinent to Current Situation/Hospitalization: No - Comment as needed  Activities of Daily Living      Permission Sought/Granted Permission sought to share information with : Facility Sport and exercise psychologist, Family Supports Permission granted to share information with : Yes, Verbal Permission Granted  Share Information with NAME: Minerva Fester [phone]  Permission granted to share info w AGENCY: [facility] Agency        Emotional Assessment Appearance:: Appears stated age   Affect (typically observed): Calm, Stable, Appropriate, Accepting Orientation: : Oriented to Self, Oriented to Place, Oriented to  Time, Oriented to Situation Alcohol / Substance Use: Not Applicable Psych Involvement: No (comment)  Admission diagnosis:  shortness of breath  Patient Active Problem List   Diagnosis Date Noted  . Pressure injury of skin 09/23/2018  . Acute respiratory failure with hypoxemia ([place]) 09/21/2018  . Mass of lower lobe of right lung 09/13/2018  . Pericardial effusion   . AKI (acute kidney injury) ([place]) 09/10/2018  PCP:  Glendon Axe, MD Pharmacy:   [facility] [place], [place] - [place] [address] [place] [address] Phone: [phone] Fax: [place], [place] - [place] [facility] [address] [place] [address] Phone: [phone] Fax: [phone]     Social  Determinants of Health ([place]) Interventions    Readmission Risk Interventions Readmission Risk Prevention Plan 09/14/2018  Medication Screening Complete  Transportation Screening Complete  Some recent data might be hidden

## 2018-09-23 NOTE — Discharge Instructions (Signed)
Hospice °Hospice is a service that is designed to provide people who are terminally ill and their families with medical, spiritual, and psychological support. Its aim is to improve your quality of life by keeping you as comfortable as possible in the final stages of life. °Who will be my providers when I begin hospice care? °Hospice teams often include: °· A nurse. °· A doctor. The hospice doctor will be available for your care, but you can include your regular doctor or nurse practitioner. °· A social worker. °· A counselor. °· A religious leader (such as a chaplain). °· A dietitian. °· Therapists. °· Trained volunteers who can help with care. °What services does hospice provide? °Hospice services can vary depending on the center or organization. Generally, they include: °· Ways to keep you comfortable, such as: °? Providing care in your home or in a home-like setting. °? Working with your family and friends to help meet your needs. °? Allowing you to enjoy the support of loved ones by receiving much of your basic care from family and friends. °· Pain relief and symptom management. The staff will supply all necessary medicines and equipment so that you can stay comfortable and alert enough to enjoy the company of your friends and family. °· Visits or care from a nurse and doctor. This may include 24-hour on-call services. °· Companionship when you are alone. °· Allowing you and your family to rest. Hospice staff may do light housekeeping, prepare meals, and run errands. °· Counseling. They will make sure your emotional, spiritual, and social needs are being met, as well as those needs of your family members. °· Spiritual care. This will be individualized to meet your needs and your family's needs. It may involve: °? Helping you and your family understand the dying process. °? Helping you say goodbye to your family and friends. °? Performing a specific religious ceremony or ritual. °· Massage. °· Nutrition  therapy. °· Physical and occupational therapy. °· Short-term inpatient care, if something cannot be managed in the home. °· Art or music therapy. °· Bereavement support for grieving family members. °When should hospice care begin? °Most people who use hospice are believed to have less than 6 months to live. °· Your family and health care providers can help you decide when hospice services should begin. °· If you live longer than 6 months but your condition does not improve, your doctor may be able to approve you for continued hospice care. °· If your condition improves, you may discontinue the program. °What should I consider before selecting a program? °Most hospice programs are run by nonprofit, independent organizations. Some are affiliated with hospitals, nursing homes, or home health care agencies. Hospice programs can take place in your home or at a hospice center, hospital, or skilled nursing facility. When choosing a hospice program, ask the following questions: °· What services are available to me? °· What services will be offered to my loved ones? °· How involved will my loved ones be? °· How involved will my health care provider be? °· Who makes up the hospice care team? How are they trained or screened? °· How will my pain and symptoms be managed? °· If my circumstances change, can the services be provided in a different setting, such as my home or in the hospital? °· Is the program reviewed and licensed by the state or certified in some other way? °· What does it cost? Is it covered by insurance? °· If I choose a hospice   center or nursing home, where is the hospice center located? Is it convenient for family and friends? °· If I choose a hospice center or nursing home, can my family and friends visit any time? °· Will you provide emotional and spiritual support? °· Who can my family call with questions? °Where can I learn more about hospice? °You can learn about existing hospice programs in your area  from your health care providers. You can also read more about hospice online. The websites of the following organizations have helpful information: °· National Hospice and Palliative Care Organization (NHPCO): www.nhpco.org °· National Association for Home Care & Hospice (NAHC): www.nahc.org °· Hospice Foundation of America (HFA): www.hospicefoundation.org °· American Cancer Society (ACS): www.cancer.org °· Hospice Net: www.hospicenet.org °· Visiting Nurse Associations of America (VNAA): www.vnaa.org °You may also find more information by contacting the following agencies: °· A local agency on aging. °· Your local United Way chapter. °· Your state's department of health or social services. °Summary °· Hospice is a service that is designed to provide people who are terminally ill and their families with medical, spiritual, and psychological support. °· Hospice aims to improve your quality of life by keeping you as comfortable as possible in the final stages of life. °· Hospice teams often include a doctor, nurse, social worker, counselor, religious leader,dietitian, therapists, and volunteers. °· Hospice care generally includes medicine for symptom management, visits from doctors and nurses, physical and occupational therapy, nutrition counseling, spiritual and emotional counseling, caregiver support, and bereavement support for grieving family members. °· Hospice programs can take place in your home or at a hospice center, hospital, or skilled nursing facility. °This information is not intended to replace advice given to you by your health care provider. Make sure you discuss any questions you have with your health care provider. °Document Released: 05/24/2003 Document Revised: 01/17/2017 Document Reviewed: 02/27/2016 °Elsevier Patient Education © 2020 [employer]. ° °

## 2018-09-23 NOTE — TOC Transition Note (Addendum)
Transition of Care [facility]) - CM/SW Discharge Note   Patient Details  Name: Javier Wong MRN: [id number] Date of Birth: [birth year]  Transition of Care [facility]) CM/SW Contact:  Ross Ludwig, LCSW Phone Number: 09/23/2018, 1:32 PM   Clinical Narrative:     Patient is an 85 year old male who is alert and oriented x4.  Patient has decided that he would like to go home with hospice instead of going home with home health.  Patient's son was at bedside, [name] confirmed with patient that he is planning to go  Home with hospice.  CSW made referral to [employer] through [facility] [facility].  They can accept referral, patient is requesting a bedside commode and walker.  CSW updated hospice agency.  Patient stated his wife had hospice services at the home and he was pleased with the care.  CSW contacted [facility] home health agency who were following patient, and they are aware that he is not going to have home health anymore, but will have hospice at home.   Final next level of care: Home w Hospice Care Barriers to Discharge: Barriers Resolved   Patient Goals and CMS Choice Patient states their goals for this hospitalization and ongoing recovery are:: To return back home with home with hospice services. CMS Medicare.gov Compare Post Acute Care list provided to:: Patient Choice offered to / list presented to : Patient, Adult Children  Discharge Placement  Patient will be discharging home with hospice services.                     Discharge Plan and Services In-house Referral: Clinical Social Work   Post Acute Care Choice: Museum/gallery conservator, Hospice          DME Arranged: Bedside commode, Crutches DME Agency: Other - Comment([facility] Date DME Agency Contacted: 09/23/18 Time DME Agency Contacted: [phone] Representative spoke with at DME Agency: Santiago Glad from [employer]            Social Determinants of Health (SDOH) Interventions     Readmission Risk  Interventions Readmission Risk Prevention Plan 09/14/2018  Medication Screening Complete  Transportation Screening Complete  Some recent data might be hidden

## 2018-09-23 NOTE — Progress Notes (Signed)
Javier Wong   DOB:03/22/1932   PN#:[id number]    Subjective: Patient states have a rough night.  Continues to have difficulty breathing.  Swelling in the legs improved.  Objective:  Vitals:   09/23/18 0716 09/23/18 0745  BP:  133/63  Pulse: (!) 55 (!) 56  Resp: 16 18  Temp:  98.5 F (36.9 C)  SpO2: 96% 98%     Intake/Output Summary (Last 24 hours) at 09/23/2018 2106 Last data filed at 09/23/2018 1147 Gross per 24 hour  Intake 480 ml  Output 1100 ml  Net -620 ml    Physical Exam  Constitutional: He is oriented to person, place, and time and well-developed, well-nourished, and in no distress.  Ill-appearing Caucasian male patient.  Resting by the edge of bed.  Anxious to go home.  Accompanied by son.  HENT:  Head: Normocephalic and atraumatic.  Mouth/Throat: Oropharynx is clear and moist. No oropharyngeal exudate.  Eyes: Pupils are equal, round, and reactive to light.  Neck: Normal range of motion. Neck supple.  Cardiovascular: Normal rate and regular rhythm.  Pulmonary/Chest: No respiratory distress. He has no wheezes.  Decreased air entry.  Bilateral  Abdominal: Soft. Bowel sounds are normal. He exhibits no distension and no mass. There is no abdominal tenderness. There is no rebound and no guarding.  Musculoskeletal: Normal range of motion.        General: Edema present. No tenderness.     Comments: Weeping edema bilateral lower extremities.  Neurological: He is alert and oriented to person, place, and time.  Skin: Skin is warm.  Psychiatric: Affect normal.     Labs:  Lab Results  Component Value Date   WBC 10.8 (H) 09/23/2018   HGB 13.5 09/23/2018   HCT 42.1 09/23/2018   MCV 94.0 09/23/2018   PLT 194 09/23/2018   NEUTROABS 8.8 (H) 09/21/2018    Lab Results  Component Value Date   NA 140 09/23/2018   K 3.5 09/23/2018   CL 94 (L) 09/23/2018   CO2 32 09/23/2018    Studies:  No results found.  Mass of lower lobe of right lung 85 year old male patient  with multiple medical problems including a right lower lobe mass/bulky mediastinal adenopathy/multiple liver lesions; CKD is currently admitted to hospital for worsening shortness of breath/worsening swelling in the legs  #Right lower lobe lung mass/bulky mediastinal adenopathy/pericardial effusion/liver lesions-highly suspicious for malignant process.  Liver biopsy [done on July 31st]-preliminary high-grade carcinoma-immunohistochemistry suggestive of multiple cancer.  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  Also discussed with pathology.  See discussion below  #Worsening respiratory status-multifactorial fluid overload/right lower lobe mass/adenopathy/question postobstructive pneumonia-/malignancy worsening.  #Chronic kidney disease with a recent episode of acute renal failure; currently creatinine is 2.5 worsening.  #Long discussion with the patient and son Javier Wong the room]; the pathology/aggressive lung malignancy-and in general median life would be in order of 10 to 12 months with aggressive chemotherapy.  However, chemotherapy would be prohibitive with patient's multiple comorbidities/renal function.  He would be at high risk of death from the treatment itself.  Without chemotherapy-the life expectancy would be in order of few weeks.  Patient wants to go home with hospice.  Discussed with the patient's son at length.  Also discussed with palliative care/hospitalist Dr. Manuella Ghazi.   # 40 minutes face-to-face with the patient/pt's son discussing the above plan of care; more than 50% of time spent on prognosis/ natural history; counseling and coordination.  Cammie Sickle, MD 09/23/2018  9:06 PM

## 2018-09-23 NOTE — Telephone Encounter (Signed)
Hospice called stating patient being discharged from hospital today and asking if Dr B will sign orders and serve as attending. Please advise

## 2018-09-23 NOTE — Progress Notes (Addendum)
Patient became very upset while attempting to apply DNR bracelet. Patient said he did not want it and people have been trying to do this all week. I explained to him about the DNR.  Patient states he wants to be a full code. Questioned him about CPR and his heart stopping. He said "I want CPR." Will notify physician.  Notified Dr. Sidney Ace of patient's request. Explained to patient about full code status. Patient is adamant about being a full code.

## 2018-09-24 ENCOUNTER — Telehealth: Payer: Self-pay | Admitting: *Deleted

## 2018-09-24 ENCOUNTER — Other Ambulatory Visit: Payer: Self-pay | Admitting: Internal Medicine

## 2018-09-24 NOTE — Discharge Summary (Signed)
[place] at [place] NAME: Javier Wong    MR#:  [id number]  DATE OF BIRTH:  1932-05-09  DATE OF ADMISSION:  09/10/2018 ADMITTING PHYSICIAN: Epifanio Lesches, MD  DATE OF DISCHARGE: 09/19/2018  1:45 PM  PRIMARY CARE PHYSICIAN: Glendon Axe, MD   ADMISSION DIAGNOSIS:  Hyperkalemia [E87.5] AKI (acute kidney injury) ([place]) [N17.9] Fatigue, unspecified type [R53.83]  DISCHARGE DIAGNOSIS:  Active Problems:   AKI (acute kidney injury) ([place])   Pericardial effusion   Mass of lower lobe of right lung   SECONDARY DIAGNOSIS:   Past Medical History:  Diagnosis Date  . Chronic kidney disease   . Diabetes type 2, controlled ([place])   . Hypertension      ADMITTING HISTORY  HISTORY OF PRESENT ILLNESS:  Javier Wong  is a 85 y.o. male with a known history of hypertension, diabetes mellitus type 2 comes in because of fatigue.  Patient told me that he is feeling Very weak, nauseous and has poor p.o. intake for 4 weeks.  He says that whenever he tries to eat he feels very nauseous so he stopped eating.  Denies abdominal pain or diarrhea.  No fever.  Patient also complains of shortness of breath with minimal exertion.  No 1 2 L of oxygen.  And saturation 92%.  COVID-19 test is pending.  No cough.  Patient lives alone, main complaint today is generalized weakness, poor p.o. intake.  He thought it is a pacemaker.  Patient found to have acute kidney injury with potassium up to 6, will consult nephrology, cardiology.  Patient found to have heart rate around 50 bpm.   HOSPITAL COURSE:   * Right lower lobe lung mass with subcarinal extension Seen by pulmonary and oncology.   PET with Mets for liver and femur Liver biopsy done.  Biopsy results pending.  Monitored overnight with no complications.  *Acute on chronic diastolic congestive heart failure Improved with IV lasix. Resolved  * Moderate pericardial effusion- seen on echo.  No evidence of  tamponade physiology.  Repeat echocardiogram with similar pericardial effusion.  Appreciate cardiology input. No plans for pericardiocentesis or window.  * Acute kidney injury- likely secondary to NSAID use and poor cardiac output.  Renal function is slowly improving, creatinine down from 2.84-->1.83 -->1.6 - Renal ultrasound showed medical renal disease, but no acute abnormalities. - Nephrology consulted and appreciate input.  Discussed with Dr. Juleen China. - Avoid nephrotoxic agents.  *Hyperkalemia secondary to acute kidney injury.  Resolved.  *Atrial flutter found on EKG.  Cardiology following.  Has pacemaker in place.  Not started on anticoagulation due to patient awaiting further treatment plan with biopsies. Follow-up with Dr. Ubaldo Glassing in the office.  * Deconditioning- due to above -PT consulted. SNF recommended but patient wanted to go home with home health services.  * Type 2 diabetes On sliding scale insulin in the hospital.  Resume home medications at discharge.  High risk for readmission.  Did not want to go to skilled nursing facility.  Requested that he be discharged home with home health.  Confirmed son would stay with patient.  Poor prognosis with his stage IV cancer, CKD, CHF, COPD.  CONSULTS OBTAINED:  Treatment Team:  Teodoro Spray, MD Ottie Glazier, MD  DRUG ALLERGIES:  No Known Allergies  DISCHARGE MEDICATIONS:   Allergies as of 09/19/2018   No Known Allergies     Medication List    STOP taking these medications   hydrochlorothiazide 25 MG tablet Commonly  known as: HYDRODIURIL   metFORMIN 1000 MG tablet Commonly known as: GLUCOPHAGE   ramipril 10 MG capsule Commonly known as: ALTACE     TAKE these medications   allopurinol 100 MG tablet Commonly known as: ZYLOPRIM Take 100 mg by mouth daily. Notes to patient: None given today.  Take a dose when you get home.   aspirin EC 81 MG tablet Take 81 mg by mouth daily.   Combivent Respimat 20-100  MCG/ACT Aers respimat Generic drug: Ipratropium-Albuterol Inhale 1 puff into the lungs 3 (three) times daily.   cyanocobalamin 1000 MCG/ML injection Commonly known as: (VITAMIN B-12) Inject 1,000 mcg into the muscle every 30 (thirty) days. Notes to patient: Resume your usual schedule   furosemide 40 MG tablet Commonly known as: LASIX Take 40 mg by mouth daily.   insulin lispro protamine-lispro (75-25) 100 UNIT/ML Susp injection Commonly known as: HUMALOG 75/25 MIX Inject 10 Units into the skin daily with breakfast.   naproxen sodium 220 MG tablet Commonly known as: ALEVE Take 220 mg by mouth daily as needed.   predniSONE 10 MG tablet Commonly known as: DELTASONE Take 2 tablets (20 mg total) by mouth daily. Notes to patient: Take with food   simvastatin 40 MG tablet Commonly known as: ZOCOR Take 40 mg by mouth at bedtime.   traMADol 50 MG tablet Commonly known as: ULTRAM Take 50 mg by mouth every 8 (eight) hours as needed for pain.       Today   VITAL SIGNS:  Blood pressure 128/63, pulse 66, temperature 97.6 F (36.4 C), temperature source Oral, resp. rate 18, height 5\' 11"  (1.803 m), weight 112 kg, SpO2 96 %.  I/O:  No intake or output data in the 24 hours ending 09/24/18 1736  PHYSICAL EXAMINATION:  Physical Exam  GENERAL:  85 y.o.-year-old patient lying in the bed with no acute distress.  LUNGS: Normal breath sounds bilaterally, no wheezing, rales,rhonchi or crepitation. No use of accessory muscles of respiration.  CARDIOVASCULAR: S1, S2 normal. No murmurs, rubs, or gallops.  ABDOMEN: Soft, non-tender, non-distended. Bowel sounds present. No organomegaly or mass.  NEUROLOGIC: Moves all 4 extremities. PSYCHIATRIC: The patient is alert and oriented x 3.  SKIN: No obvious rash, lesion, or ulcer.   DATA REVIEW:   CBC Recent Labs  Lab 09/23/18 0539  WBC 10.8*  HGB 13.5  HCT 42.1  PLT 194    Chemistries  Recent Labs  Lab 09/23/18 0539  NA 140  K  3.5  CL 94*  CO2 32  GLUCOSE 126*  BUN 36*  CREATININE 2.02*  CALCIUM 8.3*    Cardiac Enzymes No results for input(s): TROPONINI in the last 168 hours.  Microbiology Results  Results for orders placed or performed during the hospital encounter of 09/10/18  SARS Coronavirus 2 (CEPHEID - Performed in [place] hospital lab), Hosp Order     Status: None   Collection Time: 09/10/18  2:03 PM   Specimen: Nasopharyngeal Swab  Result Value Ref Range Status   SARS Coronavirus 2 NEGATIVE NEGATIVE Final    Comment: (NOTE) If result is NEGATIVE SARS-CoV-2 target nucleic acids are NOT DETECTED. The SARS-CoV-2 RNA is generally detectable in upper and lower  respiratory specimens during the acute phase of infection. The lowest  concentration of SARS-CoV-2 viral copies this assay can detect is 250  copies / mL. A negative result does not preclude SARS-CoV-2 infection  and should not be used as the sole basis for treatment or other  patient management decisions.  A negative result may occur with  improper specimen collection / handling, submission of specimen other  than nasopharyngeal swab, presence of viral mutation(s) within the  areas targeted by this assay, and inadequate number of viral copies  (<250 copies / mL). A negative result must be combined with clinical  observations, patient history, and epidemiological information. If result is POSITIVE SARS-CoV-2 target nucleic acids are DETECTED. The SARS-CoV-2 RNA is generally detectable in upper and lower  respiratory specimens dur ing the acute phase of infection.  Positive  results are indicative of active infection with SARS-CoV-2.  Clinical  correlation with patient history and other diagnostic information is  necessary to determine patient infection status.  Positive results do  not rule out bacterial infection or co-infection with other viruses. If result is PRESUMPTIVE POSTIVE SARS-CoV-2 nucleic acids MAY BE PRESENT.   A  presumptive positive result was obtained on the submitted specimen  and confirmed on repeat testing.  While 2019 novel coronavirus  (SARS-CoV-2) nucleic acids may be present in the submitted sample  additional confirmatory testing may be necessary for epidemiological  and / or clinical management purposes  to differentiate between  SARS-CoV-2 and other Sarbecovirus currently known to infect humans.  If clinically indicated additional testing with an alternate test  methodology [id number]) is advised. The SARS-CoV-2 RNA is generally  detectable in upper and lower respiratory sp ecimens during the acute  phase of infection. The expected result is Negative. Fact Sheet for Patients:  StrictlyIdeas.no Fact Sheet for Healthcare Providers: BankingDealers.co.za This test is not yet approved or cleared by the [place] FDA and has been authorized for detection and/or diagnosis of SARS-CoV-2 by FDA under an Emergency Use Authorization (EUA).  This EUA will remain in effect (meaning this test can be used) for the duration of the COVID-19 declaration under Section 564(b)(1) of the Act, 21 U.S.C. section 360bbb-3(b)(1), unless the authorization is terminated or revoked sooner. Performed at [facility], [address] [place], [place] [address]     RADIOLOGY:  No results found.  Follow up with PCP in 1 week.  Management plans discussed with the patient, family and they are in agreement.  CODE STATUS:  Code Status History    Date Active Date Inactive Code Status Order ID Comments User Context   09/23/2018 0132 09/23/2018 1634 Full Code [id number]  Joanne Gavel, RN Inpatient   09/21/2018 1712 09/23/2018 0132 DNR [id number]  Fritzi Mandes, MD Inpatient   09/16/2018 1153 09/19/2018 1841 DNR [id number]  Hillary Bow, MD Inpatient   09/10/2018 1521 09/16/2018 1153 Full Code [id number]  Epifanio Lesches, MD ED   Advance Care Planning  Activity      TOTAL TIME TAKING CARE OF THIS PATIENT ON DAY OF DISCHARGE: more than 30 minutes.   Leia Alf Fariha Goto M.D on 09/24/2018 at 5:36 PM  Between 7am to 6pm - Pager - [phone]  After 6pm go to [email or web address] - password [email or web address] [facility]  Office  [phone]  CC: Primary care physician; Glendon Axe, MD  Note: This dictation was prepared with Dragon dictation along with smaller phrase technology. Any transcriptional errors that result from this process are unintentional.

## 2018-09-24 NOTE — Telephone Encounter (Signed)
Per scheduling - Son requests to cancel appointments scheduled for 8/7 as pt is now on Hospice   apts cnl per family request

## 2018-09-24 NOTE — Discharge Summary (Signed)
[place] at [place] NAME: Javier Wong    MR#:  [id number]  DATE OF BIRTH:  08/14/1932  DATE OF ADMISSION:  09/21/2018   ADMITTING PHYSICIAN: Javier Mandes, MD  DATE OF DISCHARGE: 09/23/2018  1:00 PM  PRIMARY CARE PHYSICIAN: Javier Axe, MD   ADMISSION DIAGNOSIS:  shortness of breath  DISCHARGE DIAGNOSIS:  Active Problems:   Mass of lower lobe of right lung   Acute respiratory failure with hypoxemia (H[facility])   Pressure injury of skin  SECONDARY DIAGNOSIS:   Past Medical History:  Diagnosis Date  . Chronic kidney disease   . Diabetes type 2, controlled ([place])   . Hypertension    HOSPITAL COURSE:  [name] a85 y.o.malewith a known history ofacute on chronic kidney disease stage III, type II diabetes, diastolic congestive heart failure, hypertension, right lower lobe mass/bulky mediastinal adenopathy/multiple liver lesions; admitted for worsening shortness of breath/worsening swelling in the legs  * Acute hypoxic respiratory failure: multifactorial fluid overload/right lower lobe mass/adenopathy/question postobstructive pneumonia *Left lower extremity edema with blister -no s/s of infection  *Post obstructive pneumonia  *Acute on chronic kidney disease 3  *History of atrial flutter-- -patient has pacemaker  *Diabetes type II  * Right lower lobe lung mass/bulky mediastinal adenopathy/pericardial effusion/liver lesions-highly suspicious for malignant process.  Liver biopsy [done on July 31st]-preliminary high-grade carcinoma-immunohistochemistry suggestive of multiple cancer.  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   * Long discussion with the patient and son Javier Wong the room] - they would like to go home with hospice.  DISCHARGE CONDITIONS:  fair CONSULTS OBTAINED:  Treatment Team:  Javier Dana, MD Javier Sickle, MD DRUG ALLERGIES:  No Known  Allergies DISCHARGE MEDICATIONS:   Allergies as of 09/23/2018   No Known Allergies     Medication List    TAKE these medications   allopurinol 100 MG tablet Commonly known as: ZYLOPRIM Take 100 mg by mouth daily.   amoxicillin-clavulanate 875-125 MG tablet Commonly known as: Augmentin Take 1 tablet by mouth every 12 (twelve) hours for 7 days.   aspirin EC 81 MG tablet Take 81 mg by mouth daily.   Combivent Respimat 20-100 MCG/ACT Aers respimat Generic drug: Ipratropium-Albuterol Inhale 1 puff into the lungs 3 (three) times daily.   cyanocobalamin 1000 MCG/ML injection Commonly known as: (VITAMIN B-12) Inject 1,000 mcg into the muscle every 30 (thirty) days.   furosemide 40 MG tablet Commonly known as: LASIX Take 40 mg by mouth daily.   insulin lispro protamine-lispro (75-25) 100 UNIT/ML Susp injection Commonly known as: HUMALOG 75/25 MIX Inject 10 Units into the skin daily with breakfast.   metFORMIN 1000 MG tablet Commonly known as: GLUCOPHAGE Take 1,000 mg by mouth 2 (two) times daily with a meal.   naproxen sodium 220 MG tablet Commonly known as: ALEVE Take 220 mg by mouth daily as needed.   predniSONE 10 MG tablet Commonly known as: DELTASONE Take 2 tablets (20 mg total) by mouth daily.   simvastatin 40 MG tablet Commonly known as: ZOCOR Take 40 mg by mouth at bedtime.   traMADol 50 MG tablet Commonly known as: ULTRAM Take 50 mg by mouth every 8 (eight) hours as needed for pain.        DISCHARGE INSTRUCTIONS:   DIET:  Regular diet DISCHARGE CONDITION:  Good ACTIVITY:  Activity as tolerated OXYGEN:  Home Oxygen: No.  Oxygen Delivery: room air DISCHARGE LOCATION:  home with  Hospice  If you experience worsening of your admission symptoms, develop shortness of breath, life threatening emergency, suicidal or homicidal thoughts you must seek medical attention immediately by calling 911 or calling your MD immediately  if symptoms less severe.   You Must read complete instructions/literature along with all the possible adverse reactions/side effects for all the Medicines you take and that have been prescribed to you. Take any new Medicines after you have completely understood and accpet all the possible adverse reactions/side effects.   Please note  You were cared for by a hospitalist during your hospital stay. If you have any questions about your discharge medications or the care you received while you were in the hospital after you are discharged, you can call the unit and asked to speak with the hospitalist on call if the hospitalist that took care of you is not available. Once you are discharged, your primary care physician will handle any further medical issues. Please note that NO REFILLS for any discharge medications will be authorized once you are discharged, as it is imperative that you return to your primary care physician (or establish a relationship with a primary care physician if you do not have one) for your aftercare needs so that they can reassess your need for medications and monitor your lab values.    On the day of Discharge:  VITAL SIGNS:  Blood pressure 133/63, pulse (!) 56, temperature 98.5 F (36.9 C), temperature source Oral, resp. rate 18, height 5\' 11"  (1.803 m), weight 106.9 kg, SpO2 98 %. PHYSICAL EXAMINATION:  GENERAL:  85 y.o.-year-old patient lying in the bed with no acute distress.  EYES: Pupils equal, round, reactive to light and accommodation. No scleral icterus. Extraocular muscles intact.  HEENT: Head atraumatic, normocephalic. Oropharynx and nasopharynx clear.  NECK:  Supple, no jugular venous distention. No thyroid enlargement, no tenderness.  LUNGS: Normal breath sounds bilaterally, no wheezing, rales,rhonchi or crepitation. No use of accessory muscles of respiration.  CARDIOVASCULAR: S1, S2 normal. No murmurs, rubs, or gallops.  ABDOMEN: Soft, non-tender, non-distended. Bowel sounds present. No  organomegaly or mass.  EXTREMITIES: No pedal edema, cyanosis, or clubbing.  NEUROLOGIC: Cranial nerves II through XII are intact. Muscle strength 5/5 in all extremities. Sensation intact. Gait not checked.  PSYCHIATRIC: The patient is alert and oriented x 3.  SKIN: No obvious rash, lesion, or ulcer.  DATA REVIEW:   CBC Recent Labs  Lab 09/23/18 0539  WBC 10.8*  HGB 13.5  HCT 42.1  PLT 194    Chemistries  Recent Labs  Lab 09/23/18 0539  NA 140  K 3.5  CL 94*  CO2 32  GLUCOSE 126*  BUN 36*  CREATININE 2.02*  CALCIUM 8.3*     Follow-up Information    Javier Axe, MD. Schedule an appointment as soon as possible for a visit in 5 day(s).   Specialty: Internal Medicine Contact information: [place] [address] [phone]           Management plans discussed with the patient, family and they are in agreement.  CODE STATUS: Prior   TOTAL TIME TAKING CARE OF THIS PATIENT: 45 minutes.    Max Sane M.D on 09/24/2018 at 2:31 PM  Between 7am to 6pm - Pager - [phone]  After 6pm go to [email or web address] - Proofreader  [employer] [place]  Office  [phone]  CC: Primary care physician; Javier Axe, MD   Note: This dictation was prepared with  Dragon dictation along with smaller Company secretary. Any transcriptional errors that result from this process are unintentional.

## 2018-09-25 ENCOUNTER — Inpatient Hospital Stay: Payer: Medicare Other

## 2018-09-25 ENCOUNTER — Inpatient Hospital Stay: Payer: Medicare Other | Admitting: Internal Medicine

## 2018-09-25 ENCOUNTER — Inpatient Hospital Stay: Payer: Medicare Other | Admitting: Nurse Practitioner

## 2018-09-28 ENCOUNTER — Other Ambulatory Visit: Payer: Self-pay | Admitting: *Deleted

## 2018-09-28 MED ORDER — MORPHINE SULFATE (CONCENTRATE) 20 MG/ML PO SOLN: ORAL | 0 refills | Status: DC

## 2018-09-28 MED ORDER — LORAZEPAM 0.5 MG PO TABS: 0.5000 mg | ORAL_TABLET | ORAL | 1 refills | Status: AC | PRN

## 2018-09-30 ENCOUNTER — Telehealth: Payer: Self-pay | Admitting: *Deleted

## 2018-09-30 NOTE — Telephone Encounter (Signed)
[facility] ORDER  called to April

## 2018-09-30 NOTE — Telephone Encounter (Signed)
Hospice nurse April called requesting an order to increase patient Lasix to 80 mg daily for 3 days due to severe weeping edema of lower extremities. They have tried [employer], but the patient finds them too uncomfortable to wear. Please advise

## 2018-09-30 NOTE — Telephone Encounter (Signed)
Hassan Rowan-  I am ok with increasing the lasix as requested. Thanks GB

## 2018-10-08 ENCOUNTER — Other Ambulatory Visit: Payer: Self-pay | Admitting: *Deleted

## 2018-10-08 ENCOUNTER — Telehealth: Payer: Self-pay | Admitting: *Deleted

## 2018-10-08 MED ORDER — MORPHINE SULFATE (CONCENTRATE) 20 MG/ML PO SOLN: ORAL | 0 refills | Status: AC

## 2018-10-08 NOTE — Telephone Encounter (Signed)
Hospice nurse called reporting that patient is back to the Lasix 40 mg daily after increasing to 80 mg daily times 3 for edema and his edema is getting worse again, they are asking if lasix can be increased to 80 mg daily or at least alternate 80 mg and 40  Mg daily. Please advise

## 2018-10-08 NOTE — Telephone Encounter (Signed)
Patient is actively dying and need refill of his Roxanol

## 2018-10-08 NOTE — Telephone Encounter (Signed)
Javier Wong- its ok from my standpoint to increase the Lasix to 80 mg once a day x 10 days

## 2018-10-08 NOTE — Telephone Encounter (Signed)
[facility] called to [facility] for Lasix and informed Roxanol prescription sent

## 2018-10-09 ENCOUNTER — Telehealth: Payer: Self-pay | Admitting: *Deleted

## 2018-10-11 ENCOUNTER — Telehealth: Payer: Self-pay | Admitting: Internal Medicine

## 2018-10-11 NOTE — Telephone Encounter (Signed)
Spoke to patient's son Jenny Reichmann; offered my condolences.

## 2018-10-20 NOTE — Telephone Encounter (Signed)
Toni with Hospice called to report that patient expired at 2:08 AM today

## 2018-10-20 DEATH — deceased

## 2019-09-17 IMAGING — DX PORTABLE CHEST - 1 VIEW
1 series · 1 of 1 positions shown · non-contrast
Comparison: September 15, 2018

CLINICAL DATA: Shortness of breath

EXAM:
PORTABLE CHEST 1 VIEW

[chest ap]
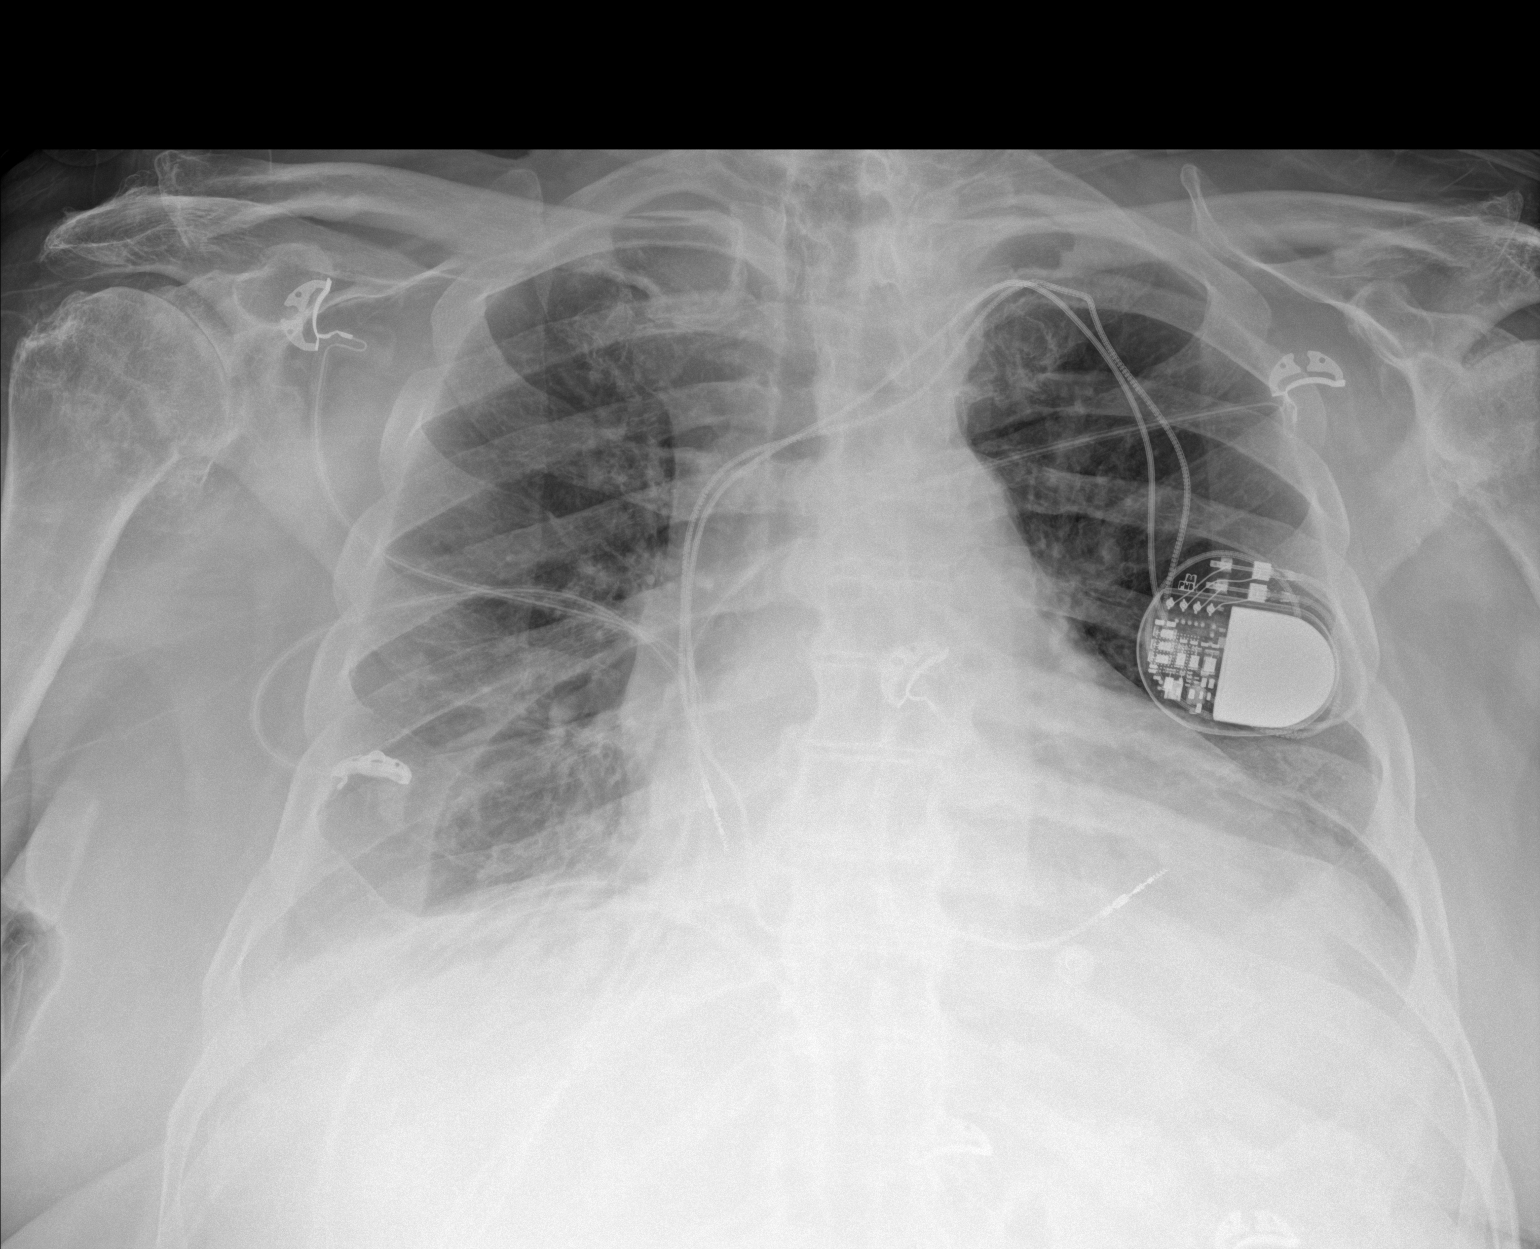

[1 of 1 positions shown; findings below may reference images not displayed]

FINDINGS: There are small pleural effusions bilaterally with bibasilar
atelectasis. There is no frank edema or consolidation. Heart is
mildly enlarged with pulmonary vascularity normal. Pacemaker leads
are attached to the right atrium and right ventricle. No adenopathy.
There is arthropathy in each shoulder.
IMPRESSION: Mild cardiomegaly. Pacemaker leads attached to right atrium and
right ventricle.

Small pleural effusions with bibasilar atelectasis. No edema or
consolidation evident.
